# Patient Record
Sex: Female | Born: 1935 | Race: White | Hispanic: No | Marital: Married | Smoking: Former smoker
Health system: Southern US, Community
[De-identification: ages and names within clinical notes are randomized; demographics above are authoritative.]

## PROBLEM LIST (undated history)

## (undated) DIAGNOSIS — R911 Solitary pulmonary nodule: Secondary | ICD-10-CM

## (undated) DIAGNOSIS — I77810 Thoracic aortic ectasia: Secondary | ICD-10-CM

## (undated) DIAGNOSIS — I10 Essential (primary) hypertension: Secondary | ICD-10-CM

## (undated) DIAGNOSIS — Z7901 Long term (current) use of anticoagulants: Secondary | ICD-10-CM

## (undated) DIAGNOSIS — I350 Nonrheumatic aortic (valve) stenosis: Secondary | ICD-10-CM

## (undated) DIAGNOSIS — N179 Acute kidney failure, unspecified: Secondary | ICD-10-CM

## (undated) DIAGNOSIS — J9601 Acute respiratory failure with hypoxia: Secondary | ICD-10-CM

## (undated) DIAGNOSIS — J189 Pneumonia, unspecified organism: Secondary | ICD-10-CM

## (undated) DIAGNOSIS — G459 Transient cerebral ischemic attack, unspecified: Secondary | ICD-10-CM

## (undated) DIAGNOSIS — C801 Malignant (primary) neoplasm, unspecified: Secondary | ICD-10-CM

## (undated) HISTORY — PX: SKIN BIOPSY: SHX1

## (undated) HISTORY — PX: HIP SURGERY: SHX245

## (undated) HISTORY — DX: Nonrheumatic aortic (valve) stenosis: I35.0

## (undated) HISTORY — DX: Long term (current) use of anticoagulants: Z79.01

## (undated) HISTORY — PX: BREAST LUMPECTOMY: SHX2

## (undated) HISTORY — DX: Thoracic aortic ectasia: I77.810

---

## 1997-04-30 ENCOUNTER — Other Ambulatory Visit: Admission: RE | Admit: 1997-04-30 | Discharge: 1997-04-30 | Payer: Self-pay | Admitting: Family Medicine

## 1999-05-03 ENCOUNTER — Other Ambulatory Visit: Admission: RE | Admit: 1999-05-03 | Discharge: 1999-05-03 | Payer: Self-pay | Admitting: Family Medicine

## 1999-05-11 ENCOUNTER — Encounter: Payer: Self-pay | Admitting: Family Medicine

## 1999-05-11 ENCOUNTER — Encounter: Admission: RE | Admit: 1999-05-11 | Discharge: 1999-05-11 | Payer: Self-pay | Admitting: Family Medicine

## 2000-05-11 ENCOUNTER — Encounter: Admission: RE | Admit: 2000-05-11 | Discharge: 2000-05-11 | Payer: Self-pay | Admitting: Family Medicine

## 2000-05-11 ENCOUNTER — Encounter: Payer: Self-pay | Admitting: Family Medicine

## 2000-05-22 ENCOUNTER — Ambulatory Visit (HOSPITAL_BASED_OUTPATIENT_CLINIC_OR_DEPARTMENT_OTHER): Admission: RE | Admit: 2000-05-22 | Discharge: 2000-05-22 | Payer: Self-pay | Admitting: Otolaryngology

## 2000-06-24 ENCOUNTER — Encounter: Payer: Self-pay | Admitting: Emergency Medicine

## 2000-06-24 ENCOUNTER — Encounter: Payer: Self-pay | Admitting: Orthopedic Surgery

## 2000-06-25 ENCOUNTER — Inpatient Hospital Stay (HOSPITAL_COMMUNITY): Admission: EM | Admit: 2000-06-25 | Discharge: 2000-06-25 | Payer: Self-pay | Admitting: Emergency Medicine

## 2001-02-05 ENCOUNTER — Encounter: Payer: Self-pay | Admitting: Family Medicine

## 2001-02-05 ENCOUNTER — Emergency Department (HOSPITAL_COMMUNITY): Admission: EM | Admit: 2001-02-05 | Discharge: 2001-02-06 | Payer: Self-pay | Admitting: Emergency Medicine

## 2001-03-05 ENCOUNTER — Encounter: Payer: Self-pay | Admitting: Specialist

## 2001-03-05 ENCOUNTER — Inpatient Hospital Stay (HOSPITAL_COMMUNITY): Admission: EM | Admit: 2001-03-05 | Discharge: 2001-03-06 | Payer: Self-pay | Admitting: Emergency Medicine

## 2001-04-20 ENCOUNTER — Inpatient Hospital Stay (HOSPITAL_COMMUNITY): Admission: EM | Admit: 2001-04-20 | Discharge: 2001-04-21 | Payer: Self-pay | Admitting: Emergency Medicine

## 2001-04-20 ENCOUNTER — Encounter: Payer: Self-pay | Admitting: Specialist

## 2001-05-10 ENCOUNTER — Encounter: Payer: Self-pay | Admitting: Specialist

## 2001-05-18 ENCOUNTER — Inpatient Hospital Stay (HOSPITAL_COMMUNITY): Admission: RE | Admit: 2001-05-18 | Discharge: 2001-05-22 | Payer: Self-pay | Admitting: Specialist

## 2002-02-20 ENCOUNTER — Other Ambulatory Visit: Admission: RE | Admit: 2002-02-20 | Discharge: 2002-02-20 | Payer: Self-pay | Admitting: Family Medicine

## 2002-02-25 ENCOUNTER — Encounter: Admission: RE | Admit: 2002-02-25 | Discharge: 2002-02-25 | Payer: Self-pay | Admitting: Family Medicine

## 2002-02-25 ENCOUNTER — Encounter: Payer: Self-pay | Admitting: Family Medicine

## 2003-02-21 ENCOUNTER — Other Ambulatory Visit: Admission: RE | Admit: 2003-02-21 | Discharge: 2003-02-21 | Payer: Self-pay | Admitting: Family Medicine

## 2003-03-06 ENCOUNTER — Encounter: Admission: RE | Admit: 2003-03-06 | Discharge: 2003-03-06 | Payer: Self-pay | Admitting: Family Medicine

## 2004-03-22 ENCOUNTER — Other Ambulatory Visit: Admission: RE | Admit: 2004-03-22 | Discharge: 2004-03-22 | Payer: Self-pay | Admitting: Family Medicine

## 2004-06-01 ENCOUNTER — Encounter: Admission: RE | Admit: 2004-06-01 | Discharge: 2004-06-01 | Payer: Self-pay | Admitting: Family Medicine

## 2005-04-26 ENCOUNTER — Other Ambulatory Visit: Admission: RE | Admit: 2005-04-26 | Discharge: 2005-04-26 | Payer: Self-pay | Admitting: Family Medicine

## 2005-07-08 ENCOUNTER — Encounter: Admission: RE | Admit: 2005-07-08 | Discharge: 2005-07-08 | Payer: Self-pay | Admitting: Family Medicine

## 2006-07-21 ENCOUNTER — Encounter: Admission: RE | Admit: 2006-07-21 | Discharge: 2006-07-21 | Payer: Self-pay | Admitting: *Deleted

## 2006-09-12 ENCOUNTER — Ambulatory Visit: Payer: Self-pay | Admitting: Vascular Surgery

## 2007-05-03 ENCOUNTER — Encounter: Admission: RE | Admit: 2007-05-03 | Discharge: 2007-05-03 | Payer: Self-pay | Admitting: Family Medicine

## 2007-07-23 ENCOUNTER — Encounter: Admission: RE | Admit: 2007-07-23 | Discharge: 2007-07-23 | Payer: Self-pay | Admitting: *Deleted

## 2007-11-24 ENCOUNTER — Ambulatory Visit: Payer: Self-pay | Admitting: Internal Medicine

## 2007-11-24 ENCOUNTER — Ambulatory Visit: Payer: Self-pay | Admitting: Cardiology

## 2007-11-24 ENCOUNTER — Inpatient Hospital Stay (HOSPITAL_COMMUNITY): Admission: EM | Admit: 2007-11-24 | Discharge: 2007-11-27 | Payer: Self-pay | Admitting: Emergency Medicine

## 2007-11-26 ENCOUNTER — Encounter (INDEPENDENT_AMBULATORY_CARE_PROVIDER_SITE_OTHER): Payer: Self-pay | Admitting: Cardiology

## 2007-11-27 ENCOUNTER — Encounter (INDEPENDENT_AMBULATORY_CARE_PROVIDER_SITE_OTHER): Payer: Self-pay | Admitting: Internal Medicine

## 2008-05-05 ENCOUNTER — Other Ambulatory Visit: Admission: RE | Admit: 2008-05-05 | Discharge: 2008-05-05 | Payer: Self-pay | Admitting: Family Medicine

## 2009-06-04 ENCOUNTER — Encounter: Admission: RE | Admit: 2009-06-04 | Discharge: 2009-06-04 | Payer: Self-pay | Admitting: *Deleted

## 2010-06-08 NOTE — Consult Note (Signed)
NAMEDEVI, HOPMAN                 ACCOUNT NO.:  [id number]   MEDICAL RECORD NO.:  [id number]          PATIENT TYPE:  INP   LOCATION:  3001                         FACILITY:  [facility]   PHYSICIAN:  Noel Christmas, MD    DATE OF BIRTH:  04-Aug-1926   DATE OF CONSULTATION:  11/25/2007  DATE OF DISCHARGE:                                 CONSULTATION   REASON FOR CONSULTATION:  Acute cerebral infarction.   A 77 year old lady who was in her usual state of health until yesterday  afternoon when she was noted to be flushed and confused by friends and  family members while at an activity at a local church.  The patient had  difficulty with speech output, initially unable to speak at all, then  several minutes later was able to respond but speech output consisted of  numbers only.  After several more minutes she began to form words.  Speech returned to normal within 30 minutes to an hour.  The patient was  also noted to be confused with activities including spilling drink on  herself without being aware of it.  She was able to walk and had no  difficulty with her gait apparently.  She used all of her extremities  equally.  There was no facial asymmetry nor slurring of speech.  The  patient has memory for most of the events that occurred but still does  not recall spilling liquid on herself which apparently occurred twice.  Evaluation on arrival in the emergency room was unremarkable.  Vitals  signs were normal.  No focal abnormalities were noted.  CT scan of her  head showed no acute changes.  MRI of her brain today showed multiple  acute small cerebral infarctions involving the middle cerebral artery  territory on the left (posterior, frontal, parietal, and posterior  insular), all of which were thought to likely be embolic in nature.  A  1.5 cm cystic area was noted in the right temporal region which was  thought to most likely represent a benign epithelial cyst.  The patient  has had no  recurrence of speech abnormality nor confusion since  admission.  She has been taking aspirin 81 mg per day.  There is no  previous history of stroke or TIA.  The MRA of her brain was  unremarkable with no signs of internal carotid nor middle cerebral  artery stenosis occlusion.   PAST MEDICAL HISTORY:  Remarkable for hypertension.  She has a history  as well of hip replacement.   FAMILY HISTORY:  Remarkable for stroke involving 2 siblings.  Family  history is also remarkable for heart disease involving parents.   CURRENT MEDICATIONS:  1. Aspirin 325 mg per day.  2. Simvastatin 20 mg per day.  3. Protonix 40 mg daily.   PHYSICAL EXAMINATION:  GENERAL:  Appearance was that of a slender  elderly lady who was alert and cooperative and in no acute distress.  She is well oriented to time as well as space.  She had no receptive nor  expressive aphasia.  Memory was normal except for  slight amnesia for  events yesterday, and she was symptomatic.  HEENT:  Pupils were equal and reactive to light.  Extraocular movements  were full and conjugate.  Visual fields were intact and normal.  There  was no facial weakness.  Hearing was normal.  Speech and palatal  movement were normal.  MUSCULOSKELETAL:  Strength and muscle tone were normal proximally and  distally in all 4 extremities except for mild right upper extremity  pronator drift.  Deep tendon reflexes were normal and symmetrical  throughout.  Plantar responses were flexor.  Sensory examination was  normal.  NECK:  Carotid auscultation was normal.   CLINICAL IMPRESSION:  1. Multiple acute left cerebral artery infarctions involving middle      cerebral artery territory, most likely embolic phenomena.  2. Benign right temporal cyst.   RECOMMENDATIONS:  1. Carotid Doppler study and echocardiogram as planned.  2. We will discontinue aspirin and add Aggrenox 1 b.i.d.   Thank you for asking me to evaluate Ms. Alessandra Bevels.      Noel Christmas, MD  Electronically Signed     CS/MEDQ  D:  11/25/2007  T:  1August 02, 202009  Job:  [id number]

## 2010-06-08 NOTE — H&P (Signed)
NAMEARDEN, AXON                 ACCOUNT NO.:  [id number]   MEDICAL RECORD NO.:  [id number]          PATIENT TYPE:  INP   LOCATION:  3001                         FACILITY:  [facility]   PHYSICIAN:  Darryl D. Prime, MD    DATE OF BIRTH:  1926/08/16   DATE OF ADMISSION:  11/24/2007  DATE OF DISCHARGE:                              HISTORY & PHYSICAL   The patient notes she sees a physician at [facility] but cannot  recall the name.  She saw Dr. Flonnie Overman prior.  She sees a Lindell Noe,  possibly a P.A.  She is full code.  The patient was the historian to the  rest of this visit and cannot recall the acute event.  At her bedside  was her son and her daughter.  The patient's total visit time is  approximately 58 minutes .   CHIEF COMPLAINT:  Could not talk.  They said I was confused.   HISTORY OF PRESENT ILLNESS:  Ms. Heminger is a 77 year old female with  history of hypertension and at church between 5 p.m. and 6 p.m. while  eating she had an acute onset of apparent confusion.  She was spilling  her drink, talking incoherently, at times when she was talking  coherently she thought it was Wednesday.  She could not recognize her  friend.  She poured drink on herself.  Apparently her face was flushed  red at one point.  The witnesses noted her report there was no loss of  consciousness.  There were no episodes of unresponsiveness.  There was  no focal weakness but her EMS report in triage here, there may have been  slurred speech lasting 5-10 minutes.  The patient's blood pressure on  arrival to triage was 118/98 and she was apparently complaining of a  left posterior headache in triage.  She denies having headache at this  time of the interview or recently.  The patient at the bedside is  complaining of a left leg cramp but denies any fevers, chills, or denies  any sweats.  She denies any recent weight gain or weight loss.  She  denies any change in appetite or recent change in medication.   She  denies any cough.  She denies any dysuria, hematuria.  She denies any  black stools or bloody stools.   PAST MEDICAL/SURGICAL HISTORY:  1. Hypertension.  2. History of multiple hip surgeries.  She is status post hip surgery      on the left.  3. The patient has history of multiple breast biopsies for benign      lesions.  4. She has a history of dizziness associated with hypokalemia.  5. History of small vocal cord polyp.  Status post surgery for that.   ALLERGIES:  No known drug allergies.   MEDICATIONS:  The patient is not sure of this and family will try to  bring her medication list and her bottles.  She notes she may be on  aspirin 81 mg daily, Fosamax every week, calcium, and Vitamin D in the  form of Os-Cal, possibly an  omega-3 fatty acid, and she is possibly on  three antihypertensives, one sounding like Propranolol and then two  others that she cannot recall.   REVIEW OF SYSTEMS:  A 14-point review of systems was negative unless  stated above.   PHYSICAL EXAMINATION:  VITAL SIGNS:  Blood pressure is 158/94 now,  repeat check 134/83, temperature of 98, respiratory rate 18, pulse of  86, and saturations 97% on room air.  GENERAL:  The patient is a female who looks much younger than her stated  age, sitting in bed in no acute distress.  NEUROLOGIC:  She is alert and oriented x4.  Currently strength and  sensation are grossly intact with 5/5 strength throughout.  Sensation is  intact to pinprick and to touch.  She has no cranial nerve  abnormalities.  The patient's deep tendon reflexes are symmetric, 1+.  The Babinskis are not abnormal.  She shows no signs of hyperreflexia.  The patient's finger-to-nose and heel-to-shin are intact.  HEENT:  Normocephalic, atraumatic.  Pupils equal, round, and reactive to  light.  Extraocular movements are intact.  Oropharynx is dry with no  posterior oropharyngeal lesions.  NECK:  Supple with no lymphadenopathy or thyromegaly.  There  are no  carotid bruits.  Brisk upstroke bilaterally of carotids.  No thyromegaly  noted.  LUNGS:  Clear to auscultation bilaterally.  CARDIOVASCULAR:  Regular rhythm and rate with no murmurs, rubs, or  gallops.  No S3 or S4.  ABDOMEN:  Scaphoid, soft, nontender, nondistended.  No  hepatosplenomegaly.  Normal active bowel sounds.  EXTREMITIES:  Show no clubbing, cyanosis, or edema.  NEUROVASCULAR:  2+ radial, 2+ dorsalis pedis, 2+ posterior tibial, 2+  brachial, and again no carotid bruit.   LABORATORY DATA:  White count is 6, hemoglobin is 13.7, hematocrit 41.5,  platelets 167, segs 64%.  Sodium 139 with potassium 3.6, chloride 104,  bicarb 28, BUN 20, creatinine 0.83, glucose was 98, calcium was 9.1.  PT  was 25, INR 1.  Urinalysis is pending.   Chest x-ray pending.  EKG pending.  CT of the head shows no acute  disease.  There were some chronic changes in bilateral white matter.   ASSESSMENT/PLAN:  This is a patient with a history of hypertension, who  now presents with acute confusion and possibly slurred speech worrisome  for a transient ischemic attack.  Rule out orthostatic hypotension.  1. Possible transient ischemic attack with rule out arrhythmia as the      source for possible thromboembolic phenomena.  She will get an EKG      and am placing her on telemetry.  Will rule out hypovolemia by      checking orthostasis and will give her gentle hydration.  Will      check a lipid panel, place her on a statin, and place her on      aspirin Will control  her blood pressure.  Will get an MRI and MRA      of the brain.  Will get carotid Dopplers and echocardiogram to rule      out  possible source of the stroke from the neck or heart. GI      prophylaxis will be with proton pump inhibitor, deep vein      thrombosis prophylaxis with pneumatic compression devices.  Will      get a urinalysis and chest x-ray to rule out infection.      Darryl D. Prime, MD  Electronically  Signed  DDP/MEDQ  D:  11/24/2007  T:  11/24/2007  Job:  [id number]

## 2010-06-08 NOTE — Consult Note (Signed)
VASCULAR SURGERY CONSULTATION   ROOPA, GRAVER  DOB:  1926-07-31                                       09/12/2006  [id number]#:[id number]   Ms. Quirion is a 77 year old female patient who has been experiencing in  her right leg with ambulation. She describes this as a pain which begins  below her knee and goes down to the pretibial area of the right leg,  extending down near her ankle and foot. This usually occurs with walking  although it is somewhat effected by what type of shoes she wears. After  resting, this does improve, and she is able to resume, but she will have  recurrent of the symptoms with further ambulation. She denies any  numbness in the foot and has no rest pain, history of nonhealing ulcers  or other vascular problems. She does occasionally develop cramps in her  legs at night.   PAST MEDICAL HISTORY:  Negative for diabetes, coronary artery disease,  COPD, stroke or hyperlipidemia. She does have hypertension.   PREVIOUS SURGERY:  Includes multiple left hip surgical procedures and  bilateral breast biopsies for benign disease.   FAMILY HISTORY:  Is positive for coronary artery disease with mother,  two sisters dying of myocardial infarction and a brother who has had  coronary artery disease and stroke. Negative for diabetes mellitus.   SOCIAL HISTORY:  The patient is widowed. She has 2 children who have  retired. She has not smoked since 1977. Does not use alcohol.   ALLERGIES:  None known.   PHYSICAL EXAMINATION:  Blood pressure is 150/82, heart rate 60,  respirations 18. Generally, she is a healthy-appearing female in no  acute distress, alert and oriented x3. Her neck is supple with 3+  carotid pulses palpable. No bruits are audible. Neurologic exam is  normal. No palpable adenopathy in the neck. Chest:  Clear to  auscultation. Cardiovascular exam reveals a regular rhythm with no  murmurs. Her abdomen is soft, nontender, with no masses.  Extremity exam  reveals 3+ femoral and popliteal and 2+ posterior tibial pulses palpable  bilaterally. Both feet are well perfused with no evidence of severe  venous insufficiency, hyperpigmentation, ulceration or edema.   Lower extremity arterial Dopplers will be performed in our office which  were essentially normal with ABIs of 1 and no significant drop with  exercise.   She was reassured regarding these findings. I do not feel that any of  her symptoms are due to arterial insufficiency, and she may need an  orthopedic consultation if this continues to be a problem. If I can be  of further assistance, please let me know.   Quita Skye Hart [place], M.D.  Electronically Signed  JDL/MEDQ  D:  09/12/2006  T:  09/14/2006  Job:  [id number]

## 2010-06-11 NOTE — H&P (Signed)
[facility]  Patient:    Julie Mosley, Julie Mosley Visit Number: [id number] MRN: [id number]          Service Type: MED Location: [phone] Attending Physician:  Pierce Crane Dictated by:   Ralene Bathe, P.A. Admit Date:  04/20/2001 Discharge Date: 04/21/2001                           History and Physical  DATE OF BIRTH:  02-18-1926  CHIEF COMPLAINT:  Left hip instability.  HISTORY OF PRESENT ILLNESS:  This is a 77 year old female who had a previous left total hip arthroplasty in 1996.  She initially had done well, however, has had two hip dislocations, most recently in February 2003.  She is felt at this time to require revision of the cup as indicated and due to intraoperative findings.  At this time she is in agreement and wishes to proceed.  PAST MEDICAL HISTORY: 1. Hypertension. 2. Osteoarthritis. 3. Chronic stress fracture right foot. 4. Recent workup for dizzy spells, which was found to be due to hypokalemia.    Negative Dopplers at that time.  PAST SURGICAL HISTORY: 1. Left total hip arthroplasty in 1996, two closed reductions. 2. Removal of vocal cord polyp. 3. Removal of benign breast cyst.  MEDICATIONS: 1. Toprol XL 25 mg q.d. 2. Klor-Con 20 mg q.d.  ALLERGIES:  No known drug allergies.  SOCIAL HISTORY:  She lives in a one-floor home, five steps into the usual entrance.  Does not smoke or drink.  She does have her husband to help her postoperatively.  FAMILY HISTORY:  Significant for mother with heart disease and father with liver cancer.  One sister and one brother have had strokes; sister at age 62 and brother at age 6.  REVIEW OF SYSTEMS:  The patient denies any recent fevers, chills, night sweats.  CNS:  No blurred or double vision.  No headaches, seizures, paralysis.  RESPIRATORY:  No shortness of breath, productive cough, hemoptysis.   CARDIOVASCULAR:  No chest pain, angina, orthopnea. GASTROINTESTINAL:  No  nausea, vomiting, constipation, melena, bloody stools. GENITOURINARY:  No dysuria, hematuria.  MUSCULOSKELETAL:  As pertinent to present illness.  PHYSICAL EXAMINATION:  GENERAL:  The patient is well-developed, well-nourished 77 year old female. She is currently in a Cam walker on the right foot, and she is neurovascularly intact with her pelvic brace on, abduction brace on her left extremity.  VITAL SIGNS:  Blood pressure 150/92.  HEENT:  Normocephalic.  Extraocular motions intact.  NECK:  Supple.  No lymphadenopathy.  No carotid bruits appreciated on exam.  CHEST:  Clear to auscultation bilaterally.  No rales, no rhonchi.  HEART:  Regular rate and rhythm.  No murmurs, gallops, rubs, heaves, or thrills.  ABDOMEN:  Positive bowel sounds.  Soft and nontender.  EXTREMITIES:  Neurovascularly intact to both lower extremities.  IMPRESSION: 1. Left hip instability, status post left total hip arthroplasty with liner    wear. 2. Hypertension. 3. Osteoarthritis. 4. Stress fracture right foot currently.  PLAN:  Admission for revision of her cup and liner as indicated.  Medical physician is Dr. Margrett Rud.  She will require his assistance intraoperatively. Dictated by:   Ralene Bathe, P.A. Attending Physician:  Pierce Crane DD:  05/15/01 TD:  05/15/01 Job: [id number]

## 2010-06-11 NOTE — Op Note (Signed)
[facility]  Patient:    Julie Mosley, Julie Mosley Visit Number: [id number] MRN: [id number]          Service Type: MED Location: [phone] Attending Physician:  Pierce Crane Dictated by:   Javier Docker, M.D. Proc. Date: 04/20/01 Admit Date:  04/20/2001                             Operative Report  PREOPERATIVE DIAGNOSIS:  Dislocated left total hip replacement.  POSTOPERATIVE DIAGNOSIS:  Dislocated left total hip replacement.  PROCEDURE:  Closed reduction left total hip.  BRIEF HISTORY:  A 77 year old status post dislocation of a total hip a month ago reduced by Dr. Lequita Halt, a patient of Dr. Montez Morita. Sent home today and scooted across the sofa, had dislocation. Seen through the emergency room, x-rays demonstrated dislocated hip superiorly and posteriorly. Operative intervention was indicated for closed reduction.  TECHNIQUE:  The patient in the supine position and after induction of adequate regional spinal anesthesia, a 90 degree flexion maneuver with gentle traction was applied to the left lower extremity. Reduction was appreciated and was located by C-arm augmentation. However, it was unstable past 90 degrees of flexion and was re-reduced and prevented from flexing past 90 degrees by placement of an abduction pillow. Post reduction radiographs by C-arm were satisfactory.  The patient will be admitted overnight for observation. Husband to bring to the hospital a brace he had utilized at home. Will be discharged to follow-up with Dr. Montez Morita. Dictated by:   Javier Docker, M.D. Attending Physician:  Pierce Crane DD:  04/20/01 TD:  04/21/01 Job: [phone]

## 2010-06-11 NOTE — Discharge Summary (Signed)
[place]  Patient:    Julie Mosley, MALLERY Visit Number: [id number] MRN: [id number]          Service Type: EMS Location: [place] Attending Physician:  Lewayne Bunting Dictated by:   Chinita Pester, N.P. Admit Date:  02/05/2001 Discharge Date: 02/05/2001   CC:         Dr. Andrey Campanile   Discharge Summary  HISTORY OF PRESENT ILLNESS:  This is a 77 year old female who was admitted to [facility] Emergency Room on February 05, 2001, and discharged on February 06, 2001.  She was admitted with a complaint of weakness, presyncope, and vertigo.  She is a 77 year old female with no cardiac history.  Starting on January 31, 2001, the patient started experiencing episodes of vertigo, had "swimmy head" while supine.  Several times while starting to stand, she felt weak in the legs.  No vision changes or frank syncope.  The patient has had palpitations with these episodes.  The day of admission she had an episode of weakness on getting out of bed. She went to see Dr. Andrey Campanile, who sent her to the emergency room.  PAST MEDICAL HISTORY:  Positive for hypertension, removal of throat polyps, left hip replacement and left hip dislocation.  ALLERGIES:  No known drug allergies.  MEDICATIONS:  Imdur 5 mcg.  HOSPITAL COURSE:  During evaluation it was noted the patients potassium level was 2.8 and she was hypovolemic. Fluids were replaced.  Her potassium was replaced.  The patient had improvement of her symptoms after IV fluids and repletion of her potassium.  DISPOSITION:  She was discharged home on all of her previous medications and also to increase her fluid intake as well as her salt intake and follow with Dr. Andrey Campanile and Doylene Canning. Ladona Ridgel, M.D., as needed. Dictated by:   Chinita Pester, N.P. Attending Physician:  Lewayne Bunting DD:  02/06/01 TD:  02/07/01 Job: [id number]

## 2010-06-11 NOTE — Op Note (Signed)
[facility]  Patient:    Julie Mosley, Julie Mosley Visit Number: [id number] MRN: [id number]          Service Type: MED Location: [phone] Attending Physician:  Rocky Crafts Dictated by:   Michael Litter. Montez Morita, M.D. Proc. Date: 03/05/01 Admit Date:  03/05/2001                             Operative Report  PREOPERATIVE DIAGNOSIS:  Left hip dislocation.  POSTOPERATIVE DIAGNOSIS:  Left hip dislocation.  PROCEDURE:  Closed reduction.  SURGEON:  Philips J. Montez Morita, M.D.  DESCRIPTION OF PROCEDURE:  After suitable spinal anesthesia, she reduced using standing overhead with straight traction and a little rotation.  After the reduction, she was put through a full range of motion, and the hip was felt to be very stable.  Her clinical history was that she has been having some inner ear dizzy spells. She bent forward in the kitchen and got dizzy, and then she fell backwards, and fell over a waste basket, and the hip dislocated, and I think it was a traumatic dislocation.   The hip was put in in 1996.  She had one previous dislocation six months ago.  In looking at what happened to her then, it appeared also to have been a traumatic-type dislocation.  She was extremely stable after that reduction by Dr. Lequita Halt.  I do not feel that she needs a brace or revision.  She was recently evaluated at [facility] for these dizzy spells, having had her heart and her carotid arteries checked out, and current thought is that it is an inner ear problem, and referred her back to her medical doctor for that. She goes to recovery in good condition.  The x-rays are satisfactory post reduction. Dictated by:   [employer]. Montez Morita, M.D. Attending Physician:  Rocky Crafts DD:  03/05/01 TD:  03/06/01 Job: [id number]

## 2010-06-11 NOTE — Discharge Summary (Signed)
[facility]  Patient:    Julie Mosley, Julie Mosley Visit Number: [id number] MRN: [id number]          Service Type: SUR Location: 4W [id number] [id number] Attending Physician:  Rocky Crafts Dictated by:   Della Goo, P.A.-C. Admit Date:  05/18/2001 Discharge Date: 05/22/2001                             Discharge Summary  ADMISSION DIAGNOSES: 1. 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 2. Hypertension. 3. Osteoarthritis. 4. History of hypokalemia.  DISCHARGE DIAGNOSES: 1. 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 2. Hypertension. 3. Osteoarthritis. 4. History of hypokalemia. 5. Postoperative esophageal food impaction, resolved at discharge. 6. Post hemorrhagic anemia.  PROCEDURES:  On May 18, 2001, the patient underwent revision of acetabular component and femoral head of left total hip replacement performed by Dr Montez Morita, assisted by Dr. Darrelyn Hillock, under general anesthesia.  CONSULTATIONS:  Dr. Lucky Cowboy, otolaryngology.  BRIEF HISTORY:  Julie Mosley is a 77 year old female with previous left total hip replacement in 1996.  She has had two dislocations with the most recent one being February 2003.  It was felt that she would require revision of he cup and was admitted to the hospital to undergo this procedure.  HOSPITAL COURSE:  The patient tolerated the procedure without difficulty. Postoperatively, neurovascular and motor functions were found to be intact in both lower extremities.  The patient was full weightbearing on the operative extremity.  She participated with physical therapy during her hospital stay and was able to ambulate as much as 100 feet prior to discharge.  She was safe with transfers and understood total hip replacement precautions and was able to demonstrate these as well.  The patient was placed on Coumadin for DVT prophylaxis  and PE prophylaxis. She was monitored by the pharmacy at [facility] with adjustments in dosage made according to daily pro time.  The patients incision was healing well at the left hip.  She was noted to have some blisters of the proximal posterior thigh and buttock area.  These were covered with Tegaderm.  There was some mild serous drainage from this area; however, no signs of infection. Neurovascular and motor functions remained intact throughout the hospital stay.  On May 22, 2001, her fourth postoperative day, she was felt stable for discharge to her home.  LABORATORY DATA:  Admission CBC: Hemoglobin 11.9, hematocrit 35.4.  Hemoglobin dropped to the lowest value of 10.9 and prior to discharge was 11.3. Admission coagulation studies were normal.  At the time of discharge, her INR was 1.3 and pro time 15.8.  Chemistries on admission were within normal limits with the exception of glucose of 120.  Repeat chemistry study on April 26 showed values normal with the exception of glucose 199.  Urinalysis on and showed moderate leukocyte esterase, 3 to 6 wbcs, and many bacteria.  A repeat on April 25 showed trace leukocyte esterase, few epithelial cells, 3 to 6 wbcs, and hyaline cast.  Chest x-ray on admission showed no active disease.  EKG on admission showed normal sinus rhythm, nonspecific ST and T wave abnormalities with premature ventricular complexes not seen since previous tracing, confirmed by Dr. Viann Fish.  PLAN:  The patient is being discharged to her home.  She will utilize [employer] for physical  therapy as well as Coumadin management.  She will continue to be weightbearing as tolerated for ambulation and will undergo range of motion strengthening exercises adhering to total hip replacement precautions.  Equipment has been provided for home including a walker and elevated toilet seat.  She will change her dressing daily to the wound.   The blisters on the posterior proximal thigh will be treated with antibiotic ointment at home.  She will be allowed to shower.  She has been advised to call the office if she notes any drainage from her wound or any changes in the blisters of her skin which were caused by tape.  She will follow up with Dr. Montez Morita two weeks from the day of her surgery and has been advised to call the office to arrange the appointment.  DISCHARGE MEDICATIONS:  She will resume her home medications and has been given prescriptions. 1. Percocet 5/325, #30, 1 to 2 every 4 to 6 hours for pain. 2. Skelaxin 400 mg, #30, 1 every 6 to 8 hours as needed for spasm. 3. Coumadin will be dosed per the pharmacy.  SPECIAL INSTRUCTIONS:  She has been advised to call Dr. Assunta Found office if there are further questions or concerns prior to her return office visit.  She will follow up with her family medical doctor regarding any other medical issues.  CONDITION UPON DISCHARGE:  Stable. Dictated by:   Della Goo, P.A.-C. Attending Physician:  Rocky Crafts DD:  05/22/01 TD:  05/22/01 Job: [id number]

## 2010-06-11 NOTE — H&P (Signed)
[facility]  Patient:    Julie Mosley, Julie Mosley Visit Number: [id number] MRN: [id number]          Service Type: MED Location: [phone] Attending Physician:  Rocky Crafts Dictated by:   Michael Litter. Montez Morita, M.D. Admit Date:  03/05/2001                           History and Physical  CHIEF COMPLAINT:  Dislocated left hip.  HISTORY OF PRESENT ILLNESS:  This is a pleasant 77 year old lady who had bent over in the kitchen, got dizzy, and fell backwards over a trash can, and dislocated her left hip.  PAST MEDICAL HISTORY:  Left total hip in 1996.  Did well and then had a similar traumatic episode about six months ago, requiring a location under general anesthesia, being done by Dr. Lequita Halt in June of 2002.  She has done well every since until this traumatic incident.  Just recently, she was hospitalized with fall out spells.  They worked up her heart and she had a little low potassium, and was now taking a potassium supplement and her blood pressure medicine which has now been changed to Toprol.  Had carotid Dopplers as well.  Was told it is an inner ear problem and is being followed for that.  REVIEW OF SYSTEMS:  Otherwise, her review of systems is fairly benign.  PHYSICAL EXAMINATION:  HEART:  Revealed a normal sinus rhythm without murmur.  ABDOMEN:  Soft.  LUNGS:  Clear.  EXTREMITIES:  Neurovascular to the left foot seemed okay.  The left hip was shortened and internally rotated.  IMPRESSION:  Acute traumatic dislocation of left total hip prosthesis.  RECOMMENDATION:  The plan was to take her to the operating room and do a closed reduction with spinal anesthesia. Dictated by:   [employer]. Montez Morita, M.D. Attending Physician:  Rocky Crafts DD:  03/05/01 TD:  03/06/01 Job: [id number]

## 2010-06-11 NOTE — Op Note (Signed)
[place]  Patient:    Julie Mosley, Julie Mosley                        MRN: [id number] Proc. Date: 05/22/00 Adm. Date:  [id number] Attending:  Susy Frizzle CC:         Vale Haven. Andrey Campanile, M.D.   Operative Report  PREOPERATIVE DIAGNOSIS:  Hoarseness and laryngeal mass.  POSTOPERATIVE DIAGNOSIS:  Hoarseness and laryngeal mass.  PROCEDURE:  Microlaryngoscopy with excision of vocal cord mass.  SURGEON:  Jefry H. Pollyann Kennedy, M.D.  ANESTHESIA:  General endotracheal.  COMPLICATIONS:  None.  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  The remainder of the examination unremarkable.  REFERRING PHYSICIAN:  Duffy Rhody C. Andrey Campanile, M.D.  HISTORY:  A 77 year old lady with a three to four month history of chronic hoarseness.  Risks, benefits, alternatives, and complications of the procedure were explained to the patient who seemed to understand and agreed to surgery.  DESCRIPTION OF PROCEDURE:  The patient was taken to the operating room and placed on the operating table in the supine position.  Following induction of general endotracheal anesthesia, the table was turned, and the patient was prepped and draped in the standard fashion for laryngoscopy.  The Dedo laryngoscope was inserted into the oral cavity and used to view the laryngeal and hyopharyngeal structures.  It was attached to the suspension apparatus to the Mayo stand.  The microscope was brought into place.  The examination revealed the above mentioned findings.  Careful inspection of the remainder of the larynx was unremarkable.  The vocal cords themselves were clean other than the pedunculated mass.  The mass itself was irregularly shaped, slightly purplish to erythematous, and appeared to be covered by mucosa. Microlaryngoscopy scissors were used to excise the base of the lesion.  There was minimal bleeding.  The remaining mucosa was basically reopposed with a small defect on the  membranous surface of the left mid true vocal cord. Topical adrenalin was used to provide hemostasis.  The airway was suctioned of blood and secretions.  The laryngoscope was removed.  The lesion was sent for pathologic evaluation.  The patient was then awakened, extubated, and transferred to recovery. DD:  05/23/00 TD:  05/23/00 Job: [id number]

## 2010-06-11 NOTE — Discharge Summary (Signed)
NAMESUKAINA, Julie Mosley                 ACCOUNT NO.:  [id number]   MEDICAL RECORD NO.:  [id number]          PATIENT TYPE:  INP   LOCATION:  3001                         FACILITY:  [facility]   PHYSICIAN:  Kela Millin, M.D.DATE OF BIRTH:  05/14/1926   DATE OF ADMISSION:  11/23/2007  DATE OF DISCHARGE:  11/27/2007                               DISCHARGE SUMMARY   DISCHARGE DIAGNOSES:  1. Left middle cerebral artery territory infarcts, likely embolic, TEE      negative for embolic source.  2. Probable urinary tract infection.  3. Hypokalemia.  Potassium replaced.  4. Hypertension.  5. 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  6. History of multiple breast biopsies for benign lesions.  7. History of multiple hip surgeries.   PROCEDURE AND STUDIES:  1. CT scan of head.  Chronic ischemic changes.  No acute abnormality.  2. MRA of the brain.  Several small areas of acute infarct in the left      middle cerebral artery territory likely due to emboli.  Atrophy and      chronic microvascular ischemia.  Benign appearing cyst in the right      temporal lobe.  MRA of head was negative.  3. A 2-D echocardiogram .  Overall left ventricular systolic function      was mildly decreased the left ventricular ejection fraction      estimated range of 40-45%.  There was moderate focal basal septal      hypertrophy.  There was moderate paradoxical motion of the      interventricular septum consistent with a conduction abnormality or      paced rhythm.  The aortic valve was mildly calcified.  Findings      were consistent with very mild aortic valve stenosis.  There was a      small mobile vegetation of the aortic valve.  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  There was moderate aortic      root dilatation.  There was a moderate mitral valve prolapse      involving the posterior leaflet.  There was  mild mitral valvular      regurgitation.  The mitral regurgitation check was eccentric and      directed anteriorly.  4. Transesophageal echocardiogram.  Left ventricular ejection fraction      estimated to be 55%.  Aortic valve thickness mildly increased.      Findings consistent with mild aortic valve stenosis.  There was      mild ascending aortic dilatation.  There was a moderate atheroma of      the descending aorta.  The left atrium was moderately dilated.  No      intracardiac shunt was detected by contrast study with agitated      saline.  No vegetations reported.   CONSULTATIONS:  1. Neurology, Dr. Pearlean Brownie.  2. Cardiology, Dr. Gala Romney.   BRIEF HISTORY:  The patient is a 77 year old white  female with the above-  listed medical problems who presented with confusion and difficulty  talking.  It was reported that she had gone to church, and while eating,  she had an acute onset of confusion.  She spilled her drink and was  talking incoherently for the most part.  There was no focal weakness  reported, and there may have been slurred speech lasting about 5-10  minutes.  She came to the ER and a CT scan of the head was done and the  results as stated above.  She was admitted for further evaluation and  management.   Please see the full admission history and physical dictated on November 24, 2007, by Dr. Rachael Fee Prime for the details of the admission physical  exam as well as the laboratory data.   HOSPITAL COURSE:  1. Left middle cerebral artery territory infarct.  Upon admission, she      had a CT scan of her brain done and the results as stated above,      and it was noted that she had been on 81 mg dose aspirin daily      prior to admission, and her dose was at that time increased to 325      mg daily.  She had an MRI of her brain and the results as stated      above revealing the left MCA territory infarct.  Following this,      the aspirin was discontinued and the patient  was placed on Aggrenox      b.i.d.  Neurology saw the patient and agreed with Aggrenox.  They      recommended that a TEE be done and [facility] cardiology was consulted      for the TEE.  It was done on November 27, 2007, and the results as      stated above - negative for embolic source.  The patient had a      swallowing evaluation done and a regular diet with thin liquids was      recommended.  The patient did not have any focal weakness and was      ambulating without difficulty.  After she was started on the      Aggrenox b.i.d., she developed headaches, nausea and vomiting and      so neurology recommended decreasing the dose to one tablet a day      for 2 weeks and then to go up to one tablet two times a day      thereafter and she was to take Tylenol 30 minutes before the      Aggrenox.  After the TEE came back negative for a mild source,      Cardiology and Neurology both recommended that she be discharged      home.  2. Urinary tract infection.  The patient had a urinalysis done while      in the hospital and it was consistent with a UTI.  She was started      on Rocephin.  She has remained afebrile with no leukocytosis.  She      was discharged on Ceftin to complete antibiotic course outpatient.  3. Hypertension.  The patient was to continue her outpatient      medications upon discharge.   DISCHARGE MEDICATIONS:  1. Metoprolol 25 mg p.o. b.i.d.  2. Alendronate 70 mg once a week.  3. Lisinopril 10 mg daily.  4. HCTZ 25 mg daily.  5. Omega-3 fatty  acids 1000 mg daily.  6. Multivitamin one p.o. daily.  7. Caltrate with vitamin D 2 tablets daily.  8. Glucosamine/chondroitin one tablet daily.  9. Aggrenox one tablet p.o. daily for 2 weeks and then changed to one      tablet b.i.d.  Take Tylenol 650 mg 30 minutes before Aggrenox.  10.Ceftin 250 mg one p.o. b.i.d..  11.Patient instructed to stop aspirin.   FOLLOW-UP CARE:  1. PCP/Lynn Abramowitz in 1-2 weeks.  2. Dr. Pearlean Brownie.   The patient to call Dr. Marlis Edelson office at [phone] to      arrange outpatient transcranial Doppler emboli monitoring of bubble      study.   DISCHARGE CONDITION:  Improved, stable.      Kela Millin, M.D.  Electronically Signed     ACV/MEDQ  D:  01/03/2008  T:  01/03/2008  Job:  [id number]   cc:   [facility]  Pramod P. Pearlean Brownie, MD

## 2010-06-11 NOTE — H&P (Signed)
[place]  Patient:    Julie Mosley, Julie Mosley Visit Number: [id number] MRN: [id number]          Service Type: MED Location: [phone] Attending Physician:  Devoria Albe Dictated by:   Doylene Canning. Ladona Ridgel, M.D. [facility] Admit Date:  02/05/2001   CC:         Duffy Rhody C. Andrey Campanile, M.D.   History and Physical  CHIEF COMPLAINT: Weakness and presyncope.  HISTORY OF PRESENT ILLNESS: The patient is a very pleasant, 77 year old woman, who is followed by Dr. Karma Ganja, who had no cardiac history. Over the last week, she noticed increasing weak spells and dizzy spells associated with palpitations occurring when she stands up too quickly. These spells have occurred as well at other times.  There is no frank syncope. There is no particular warning, although the symptoms seem to be made worse by quick standing. She denies any chest pain or shortness of breath. Her specific complaint is that of weakness.  PAST MEDICAL HISTORY: Notable for hypertension.  She has a history of throat polyps that were removed. She has a history of left hip replacement with a dislocation in the past.  SOCIAL HISTORY: The patient lives in [place] and is married. She has a history of tobacco but none recently and she denies any ethanol abuse.  FAMILY HISTORY: Notable for a father with an MI at age 17, and a mother who died of cancer. She has several siblings with coronary disease.  REVIEW OF SYSTEMS: Notable for no fevers, chills, night sweats or adenopathy. She denies any headache, hearing or visual changes. She does have episodes of near-syncope or presyncope as previously described. These both typically last only for a few seconds. She denies any skin changes. There are no rashes.  She denies any chest pain, shortness of breath, or dyspnea. She does have some palpitations. She denies claudication or cough. She denies any urinary frequency, urgency or dysuria. She denies any weakness,  numbness, or depression. She denies arthritic symptoms, although she does have some joint deformity in her hands bilaterally. These are not tender or painful. She denies any nausea, vomiting, diarrhea, or constipation. She denies melena. She denies any recent skin changes, weight loss, weight gain, or heat or cold intolerance.  PHYSICAL EXAMINATION:  GENERAL: On physical examination, she is a pleasant, well appearing elderly woman in no distress.  VITAL SIGNS: Blood pressure is 125/85. The pulse was 78 and regular. The respirations were 20. Temperature was 98.  HEENT: Normocephalic, atraumatic. Pupils equal and round. The oropharynx moist.  ABDOMEN: Soft, nontender, nondistended and there was no rebound or guarding.  NECK: Revealed no jugular venous distention. There were no bruits.  The trachea was midline and there is no adenopathy.  CARDIOVASCULAR: Examination revealed a regular rate and rhythm with normal S1 and S2. I did not appreciate any murmurs.  EXTREMITIES: Demonstrated no obvious clubbing, cyanosis, or edema.  LUNGS: Clear bilaterally. Auscultation with rare basilar rales.  SKIN: Revealed no rashes.  ECG demonstrates normal sinus rhythm with frequent PVCs.  IMPRESSION: 1. Recurrent episodes of presyncope associated with postural change, most    likely secondary to orthostasis. 2. Rare palpitations, most likely secondary to premature ventricular    contractions. 3. Hypertension.  DISCUSSION: We plan on admitting the patient to the hospital. Will check cardiac enzymes. Will repeat her ECG. We will watch her on telemetry. Will obtain a 2-D echocardiogram. Will do orthostatic vitals. Additional evaluation pending the above results.  Dictated by:   Doylene Canning. Ladona Ridgel, M.D. [facility] Attending Physician:  Devoria Albe DD:  02/05/01 TD:  02/06/01 Job: [id number]

## 2010-06-11 NOTE — Op Note (Signed)
[facility]  Patient:    ARLIENE, ROSENOW                        MRN: [id number] Proc. Date: 06/24/00 Adm. Date:  [id number] Disc. Date: [id number] Attending:  Ollen Gross V                           Operative Report  PREOPERATIVE DIAGNOSIS:  Left total hip arthroplasty dislocation.  POSTOPERATIVE DIAGNOSIS:  Left total hip arthroplasty dislocation.  OPERATION:  Closed reduction left hip.  SURGEON:  Ollen Gross, M.D.  ANESTHESIA:  General.  COMPLICATIONS:  None.  CONDITION:  To recovery stable.  INDICATIONS:  Ms. Reader is a 77 year old female who had a fall at home earlier today slipping on a wet floor and sustained left total hip dislocation.  She comes in now and is taken to the operating room for a closed reduction.  She had no neurovascular compromise with the dislocation.  DESCRIPTION OF PROCEDURE:  After the successful administration of general endotracheal anesthetic, traction was placed on the left lower extremity and hip audibly and palpably reduced.  She was then placed in range of motion and was stable to 90 degrees of flexion, 40 degrees of internal rotation, 30 degrees at external rotation, and also stable at full extension, external rotation.  She was placed into a knee immobilizer.  She was subsequently awakened and transported to recovery in stable condition. DD:  06/24/00 TD:  06/25/00 Job: [id number]

## 2010-06-11 NOTE — Op Note (Signed)
[facility]  Patient:    Julie Mosley, Julie Mosley Visit Number: [id number] MRN: [id number]          Service Type: SUR Location: 4W 0465 01 Attending Physician:  Rocky Crafts Dictated by:   Michael Litter. Montez Morita, M.D. Proc. Date: 05/18/01 Admit Date:  05/18/2001                             Operative Report  SURGEON:  Philips J. Montez Morita, M.D.  ASSISTANT:  Georges Lynch. Darrelyn Hillock, M.D.  PREOPERATIVE DIAGNOSIS:  Recurrent dislocation, left total hip.  POSTOPERATIVE DIAGNOSIS:  Recurrent dislocation, left total hip.  OPERATIVE PROCEDURE:  Revision of the acetabular component and the femoral head.  DESCRIPTION OF PROCEDURE:  After suitable general anesthesia, she is positioned in the right lateral decubitus and the left hip prepped and draped routinely.  An incision is made through the old incision.  The gluteus medius is protected, and the external rotator capsular mass is reflected from bone. The sciatic nerve is protected with finger. and a little bit of the rotator shell and the outside capsule is saved but the inside is removed which is markedly thick, angry material with some metal allergic-type reaction and lots of thick, heavy granulation.  After removing a good bit of the posterior aspect, it was realized how easily she was dislocating with abduction and gentle rotation.  There seemed to be a massive amount of scar tissue built up over the anteromedial area that was tending to kick it out.  The cup was then removed by means by drill hole and a screw, and then all of that anterior synovial thick scar was all removed, and the edge of the metal cup was exposed all the way around.  A trial reduction was then done with a 32 degree head with a 10 degree cup liner, moving it a bit posteriorly from the last position, and it was extremely stable then with the 32 mm head plus 10 going from a 0 on the 26 to a plus 10 with 32.  External rotation was extremely stable  as well with full hyperextension and external rotation really stable. So we elected to go with that rather than the constrained cup based on her original history of spell that first dislocated it.  A real cup was then inserted followed by the head.  A thorough irrigation was done to get the loose pieces of plastic that had come out with the drill out, clean all the material up, good dry field was obtained.  Reattached a little bit of the remaining capsular external rotator mass back in position.  Sciatic nerve was well out of the way.  Routine wound closure then of the fascia lata and gluteus maximus with #1 PDS, a little 2-0 in the subcu, and a running Monocryl with Steri-Strips in the skin.  Nice compression dressing.  She goes to recovery in good condition. Dictated by:   [employer]. Montez Morita, M.D. Attending Physician:  Rocky Crafts DD:  05/18/01 TD:  05/19/01 Job: [id number]

## 2010-06-15 ENCOUNTER — Other Ambulatory Visit: Payer: Self-pay | Admitting: Family Medicine

## 2010-06-15 DIAGNOSIS — Z1231 Encounter for screening mammogram for malignant neoplasm of breast: Secondary | ICD-10-CM

## 2010-06-25 ENCOUNTER — Ambulatory Visit
Admission: RE | Admit: 2010-06-25 | Discharge: 2010-06-25 | Disposition: A | Discharge status: Home / Self Care | Payer: Medicare Other | Source: Ambulatory Visit | Attending: Family Medicine | Admitting: Family Medicine

## 2010-06-25 DIAGNOSIS — Z1231 Encounter for screening mammogram for malignant neoplasm of breast: Secondary | ICD-10-CM

## 2010-10-25 LAB — CK TOTAL AND CKMB (NOT AT ARMC)
CK, MB: 2.8
Relative Index: INVALID
Total CK: 98

## 2010-10-25 LAB — PROTIME-INR
INR: 1
Prothrombin Time: 13.3

## 2010-10-25 LAB — DIFFERENTIAL
Basophils Absolute: 0
Basophils Relative: 1
Eosinophils Relative: 5
Lymphocytes Relative: 20
Lymphs Abs: 1.2
Monocytes Absolute: 0.6
Monocytes Relative: 10
Neutro Abs: 3.8

## 2010-10-25 LAB — URINE MICROSCOPIC-ADD ON

## 2010-10-25 LAB — URINALYSIS, ROUTINE W REFLEX MICROSCOPIC
Ketones, ur: NEGATIVE
Specific Gravity, Urine: 1.016
Urobilinogen, UA: 0.2

## 2010-10-25 LAB — CBC
HCT: 41.5
MCHC: 33.1
WBC: 6

## 2010-10-25 LAB — APTT: aPTT: 25

## 2010-10-25 LAB — TROPONIN I: Troponin I: 0.05

## 2010-10-25 LAB — BASIC METABOLIC PANEL
Calcium: 9.1
Potassium: 3.6

## 2010-10-26 LAB — URINE CULTURE: Colony Count: 70000

## 2010-10-26 LAB — BASIC METABOLIC PANEL
CO2: 24
Calcium: 8.4
Calcium: 8.4
Chloride: 109
Creatinine, Ser: 0.52
GFR calc Af Amer: >60
GFR calc Af Amer: >60
GFR calc non Af Amer: >60
Glucose, Bld: 92
Potassium: 3.5
Potassium: 3.9
Sodium: 137

## 2010-10-26 LAB — OCCULT BLOOD X 1 CARD TO LAB, STOOL: Fecal Occult Bld: NEGATIVE

## 2010-10-26 LAB — DIFFERENTIAL
Basophils Absolute: 0
Basophils Relative: 0
Eosinophils Absolute: 0.3
Eosinophils Relative: 4
Lymphs Abs: 1.4
Monocytes Absolute: 0.7
Monocytes Relative: 11

## 2010-10-26 LAB — HEMOGLOBIN A1C
Hgb A1c MFr Bld: 5.6
Mean Plasma Glucose: 114

## 2010-10-26 LAB — CBC
MCHC: 33.8
Platelets: 139 — ABNORMAL LOW
Platelets: DECREASED
RBC: 3.9
RBC: 4.13
WBC: 6.7

## 2010-10-26 LAB — MAGNESIUM: Magnesium: 1.8

## 2010-10-26 LAB — LIPID PANEL
Cholesterol: 116
LDL Cholesterol: 61

## 2012-03-20 ENCOUNTER — Ambulatory Visit
Admission: RE | Admit: 2012-03-20 | Discharge: 2012-03-20 | Disposition: A | Discharge status: Home / Self Care | Payer: Medicare Other | Source: Ambulatory Visit | Attending: Family Medicine | Admitting: Family Medicine

## 2012-03-20 ENCOUNTER — Other Ambulatory Visit: Payer: Self-pay | Admitting: Family Medicine

## 2012-03-20 DIAGNOSIS — M549 Dorsalgia, unspecified: Secondary | ICD-10-CM

## 2012-03-20 DIAGNOSIS — M542 Cervicalgia: Secondary | ICD-10-CM

## 2012-04-02 ENCOUNTER — Emergency Department (HOSPITAL_COMMUNITY): Payer: Medicare Other

## 2012-04-02 ENCOUNTER — Encounter (HOSPITAL_COMMUNITY): Payer: Self-pay | Admitting: *Deleted

## 2012-04-02 ENCOUNTER — Observation Stay (HOSPITAL_COMMUNITY)
Admission: EM | Admit: 2012-04-02 | Discharge: 2012-04-04 | Disposition: A | Discharge status: Skilled Nursing Facility | Payer: Medicare Other | Attending: Internal Medicine | Admitting: Internal Medicine

## 2012-04-02 ENCOUNTER — Inpatient Hospital Stay (HOSPITAL_COMMUNITY): Payer: Medicare Other

## 2012-04-02 DIAGNOSIS — R52 Pain, unspecified: Secondary | ICD-10-CM

## 2012-04-02 DIAGNOSIS — R42 Dizziness and giddiness: Secondary | ICD-10-CM

## 2012-04-02 DIAGNOSIS — C50919 Malignant neoplasm of unspecified site of unspecified female breast: Secondary | ICD-10-CM | POA: Insufficient documentation

## 2012-04-02 DIAGNOSIS — Z8673 Personal history of transient ischemic attack (TIA), and cerebral infarction without residual deficits: Secondary | ICD-10-CM

## 2012-04-02 DIAGNOSIS — I4891 Unspecified atrial fibrillation: Secondary | ICD-10-CM | POA: Insufficient documentation

## 2012-04-02 DIAGNOSIS — G319 Degenerative disease of nervous system, unspecified: Secondary | ICD-10-CM | POA: Insufficient documentation

## 2012-04-02 DIAGNOSIS — M4000 Postural kyphosis, site unspecified: Secondary | ICD-10-CM | POA: Insufficient documentation

## 2012-04-02 DIAGNOSIS — R0681 Apnea, not elsewhere classified: Secondary | ICD-10-CM | POA: Insufficient documentation

## 2012-04-02 DIAGNOSIS — Z7901 Long term (current) use of anticoagulants: Secondary | ICD-10-CM | POA: Insufficient documentation

## 2012-04-02 DIAGNOSIS — G459 Transient cerebral ischemic attack, unspecified: Secondary | ICD-10-CM | POA: Insufficient documentation

## 2012-04-02 DIAGNOSIS — M1990 Unspecified osteoarthritis, unspecified site: Secondary | ICD-10-CM | POA: Insufficient documentation

## 2012-04-02 DIAGNOSIS — R110 Nausea: Secondary | ICD-10-CM

## 2012-04-02 DIAGNOSIS — M542 Cervicalgia: Principal | ICD-10-CM

## 2012-04-02 DIAGNOSIS — I10 Essential (primary) hypertension: Secondary | ICD-10-CM | POA: Diagnosis present

## 2012-04-02 DIAGNOSIS — M436 Torticollis: Secondary | ICD-10-CM | POA: Insufficient documentation

## 2012-04-02 HISTORY — DX: Transient cerebral ischemic attack, unspecified: G45.9

## 2012-04-02 HISTORY — DX: Essential (primary) hypertension: I10

## 2012-04-02 HISTORY — DX: Malignant (primary) neoplasm, unspecified: C80.1

## 2012-04-02 LAB — BASIC METABOLIC PANEL
BUN: 19 mg/dL (ref 6–23)
Calcium: 9.2 mg/dL (ref 8.4–10.5)
GFR calc Af Amer: 88 mL/min — ABNORMAL LOW (ref >90)
GFR calc non Af Amer: 76 mL/min — ABNORMAL LOW (ref >90)
Potassium: 4.3 mEq/L (ref 3.5–5.1)

## 2012-04-02 LAB — PROTIME-INR
INR: 1.93 — ABNORMAL HIGH (ref 0.00–1.49)
Prothrombin Time: 21.3 seconds — ABNORMAL HIGH (ref 11.6–15.2)

## 2012-04-02 LAB — TROPONIN I: Troponin I: <0.3 ng/mL (ref <0.30)

## 2012-04-02 LAB — CBC
Hemoglobin: 14.2 g/dL (ref 12.0–15.0)
MCHC: 33.6 g/dL (ref 30.0–36.0)
Platelets: 144 10*3/uL — ABNORMAL LOW (ref 150–400)
RDW: 15 % (ref 11.5–15.5)

## 2012-04-02 MED ORDER — HYDROCHLOROTHIAZIDE 25 MG PO TABS
25.0000 mg | ORAL_TABLET | Freq: Every day | ORAL | Status: DC
  Administered 2012-04-02: 25 mg via ORAL
  Filled 2012-04-02 (×2): qty 1

## 2012-04-02 MED ORDER — WARFARIN SODIUM 1 MG PO TABS
1.0000 mg | ORAL_TABLET | Freq: Once | ORAL | Status: AC
  Administered 2012-04-02: 1 mg via ORAL
  Filled 2012-04-02: qty 1

## 2012-04-02 MED ORDER — DIAZEPAM 5 MG/ML IJ SOLN
2.5000 mg | Freq: Once | INTRAMUSCULAR | Status: AC
  Administered 2012-04-02: 2.5 mg via INTRAVENOUS
  Filled 2012-04-02: qty 2

## 2012-04-02 MED ORDER — ONDANSETRON HCL 4 MG/2ML IJ SOLN
4.0000 mg | Freq: Once | INTRAMUSCULAR | Status: AC
  Administered 2012-04-02: 4 mg via INTRAVENOUS
  Filled 2012-04-02: qty 2

## 2012-04-02 MED ORDER — OMEGA-3-ACID ETHYL ESTERS 1 G PO CAPS
1.0000 g | ORAL_CAPSULE | Freq: Every day | ORAL | Status: DC
  Administered 2012-04-02 – 2012-04-04 (×3): 1 g via ORAL
  Filled 2012-04-02 (×3): qty 1

## 2012-04-02 MED ORDER — ONDANSETRON HCL 4 MG PO TABS: 4.0000 mg | ORAL_TABLET | Freq: Four times a day (QID) | ORAL | Status: DC | PRN

## 2012-04-02 MED ORDER — HYDROMORPHONE HCL PF 1 MG/ML IJ SOLN
0.5000 mg | Freq: Once | INTRAMUSCULAR | Status: AC
  Administered 2012-04-02: 0.5 mg via INTRAVENOUS
  Filled 2012-04-02: qty 1

## 2012-04-02 MED ORDER — FENTANYL CITRATE 0.05 MG/ML IJ SOLN
25.0000 ug | Freq: Once | INTRAMUSCULAR | Status: AC
  Administered 2012-04-02: 25 ug via INTRAVENOUS
  Filled 2012-04-02: qty 2

## 2012-04-02 MED ORDER — OXYCODONE-ACETAMINOPHEN 5-325 MG PO TABS
1.0000 | ORAL_TABLET | ORAL | Status: DC | PRN
  Administered 2012-04-02 – 2012-04-04 (×5): 1 via ORAL
  Filled 2012-04-02 (×5): qty 1

## 2012-04-02 MED ORDER — CALCIUM CARBONATE 1250 (500 CA) MG PO TABS
1.0000 | ORAL_TABLET | Freq: Every day | ORAL | Status: DC
  Administered 2012-04-02 – 2012-04-04 (×3): 500 mg via ORAL
  Filled 2012-04-02 (×4): qty 1

## 2012-04-02 MED ORDER — IOHEXOL 350 MG/ML SOLN
100.0000 mL | Freq: Once | INTRAVENOUS | Status: AC | PRN
  Administered 2012-04-02: 100 mL via INTRAVENOUS

## 2012-04-02 MED ORDER — GADOBENATE DIMEGLUMINE 529 MG/ML IV SOLN
13.0000 mL | Freq: Once | INTRAVENOUS | Status: AC | PRN
  Administered 2012-04-02: 13 mL via INTRAVENOUS

## 2012-04-02 MED ORDER — ACETAMINOPHEN 325 MG PO TABS: 650.0000 mg | ORAL_TABLET | Freq: Four times a day (QID) | ORAL | Status: DC | PRN

## 2012-04-02 MED ORDER — ENOXAPARIN SODIUM 40 MG/0.4ML ~~LOC~~ SOLN
40.0000 mg | SUBCUTANEOUS | Status: DC
  Filled 2012-04-02: qty 0.4

## 2012-04-02 MED ORDER — IBUPROFEN 200 MG PO TABS: 400.0000 mg | ORAL_TABLET | Freq: Once | ORAL | Status: DC

## 2012-04-02 MED ORDER — METHOCARBAMOL 500 MG PO TABS
500.0000 mg | ORAL_TABLET | Freq: Three times a day (TID) | ORAL | Status: DC
  Administered 2012-04-02 (×2): 500 mg via ORAL
  Filled 2012-04-02 (×5): qty 1

## 2012-04-02 MED ORDER — FENTANYL CITRATE 0.05 MG/ML IJ SOLN: 25.0000 ug | INTRAMUSCULAR | Status: DC | PRN

## 2012-04-02 MED ORDER — SENNA 8.6 MG PO TABS
1.0000 | ORAL_TABLET | Freq: Every day | ORAL | Status: DC
  Administered 2012-04-02 – 2012-04-03 (×2): 8.6 mg via ORAL
  Filled 2012-04-02 (×2): qty 1

## 2012-04-02 MED ORDER — SODIUM CHLORIDE 0.9 % IV SOLN
INTRAVENOUS | Status: AC
  Administered 2012-04-02: 15:00:00 via INTRAVENOUS

## 2012-04-02 MED ORDER — KETOROLAC TROMETHAMINE 30 MG/ML IJ SOLN
30.0000 mg | Freq: Four times a day (QID) | INTRAMUSCULAR | Status: DC | PRN
  Administered 2012-04-02: 30 mg via INTRAVENOUS
  Filled 2012-04-02: qty 1

## 2012-04-02 MED ORDER — BISACODYL 5 MG PO TBEC: 10.0000 mg | DELAYED_RELEASE_TABLET | Freq: Every day | ORAL | Status: DC | PRN

## 2012-04-02 MED ORDER — ONDANSETRON HCL 4 MG/2ML IJ SOLN: 4.0000 mg | Freq: Three times a day (TID) | INTRAMUSCULAR | Status: DC | PRN

## 2012-04-02 MED ORDER — WARFARIN - PHARMACIST DOSING INPATIENT: Freq: Every day | Status: DC

## 2012-04-02 MED ORDER — LISINOPRIL 20 MG PO TABS
20.0000 mg | ORAL_TABLET | Freq: Every day | ORAL | Status: DC
  Administered 2012-04-02 – 2012-04-03 (×2): 20 mg via ORAL
  Filled 2012-04-02 (×3): qty 1

## 2012-04-02 MED ORDER — ONDANSETRON HCL 4 MG/2ML IJ SOLN: 4.0000 mg | Freq: Four times a day (QID) | INTRAMUSCULAR | Status: DC | PRN

## 2012-04-02 MED ORDER — ACETAMINOPHEN 650 MG RE SUPP: 650.0000 mg | Freq: Four times a day (QID) | RECTAL | Status: DC | PRN

## 2012-04-02 MED ORDER — SODIUM CHLORIDE 0.9 % IV SOLN: INTRAVENOUS | Status: DC

## 2012-04-02 NOTE — ED Notes (Signed)
RUE:[id number]<JW> Expected date:<BR> Expected time:<BR> Means of arrival:<BR> Comments:<BR> EMS/79 yo pain in neck

## 2012-04-02 NOTE — Progress Notes (Signed)
OT Cancellation Note  Patient Details Name: Julie Mosley MRN: [id number] DOB: 14-Nov-1926   Cancelled Treatment:    Reason Eval/Treat Not Completed: Other (comment)  Pt needs pain medicine.  Had several tests today.   Will likely return tomorrow.    Shraga Custard 04/02/2012, 4:03 PM Marica Otter, OTR/L [phone] 04/02/2012

## 2012-04-02 NOTE — ED Provider Notes (Signed)
History     CSN: [id number]  Arrival date & time 04/02/12  0421   First MD Initiated Contact with Patient 04/02/12 [phone]      Chief Complaint  Patient presents with  . Torticollis    (Consider location/radiation/quality/duration/timing/severity/associated sxs/prior treatment) HPI Julie Mosley is a 79 y.o. female who presents with neck pain. Patient is had some chronic back pain is had x-rays done on 03/20/2012 is being followed by her primary care physician, she actually has an appointment at 11 AM later today.  Patient presents due to worsening neck pain over the last 2 days. 2 days ago, patient was shoveling snow, but awoke with excruciating 9/10 sharp stabbing lower neck pain.  According to her son, it took her about 2 hours to figure out how to call for help, EMS eventually brought her to the emergency department. She denies any antecedent trauma, denies any falling. She is alert and oriented this time. She says the pain feels like cramping as well. She denies any loss of consciousness, numbness or tingling, inability to move any of her extremities, shortness of breath, chest pain, abdominal pain, vomiting or diarrhea, fevers or chills. Patient is had concurrent nausea especially when moving her head, she also has dizziness associated with moving her head. Patient does not have a history of vertigo. There is no ringing in the ears. Per Patient, has a history of Coumadin for TIA.   Past Medical History  Diagnosis Date  . TIA (transient ischemic attack)   . Hypertension   . Cancer     Past Surgical History  Procedure Laterality Date  . Hip surgery    . Cesarean section    . Breast lumpectomy      bilateral  . Skin biopsy      From left foot.    No family history on file.  History  Substance Use Topics  . Smoking status: Former Games developer  . Smokeless tobacco: Not on file  . Alcohol Use: No    OB History   Grav Para Term Preterm Abortions TAB SAB Ect Mult Living                   Review of Systems At least 10pt or greater review of systems completed and are negative except where specified in the HPI.  Allergies  Review of patient's allergies indicates no known allergies.  Home Medications   Current Outpatient Rx  Name  Route  Sig  Dispense  Refill  . ALENDRONATE SODIUM PO   Oral   Take by mouth once a week.         . calcium carbonate (OS-CAL - DOSED IN MG OF ELEMENTAL CALCIUM) 1250 MG tablet   Oral   Take 1 tablet by mouth daily.         Marland Kitchen glucosamine-chondroitin 500-400 MG tablet   Oral   Take 1 tablet by mouth 3 (three) times daily.         Marland Kitchen omega-3 acid ethyl esters (LOVAZA) 1 G capsule   Oral   Take 1 g by mouth daily.         Marland Kitchen PRESCRIPTION MEDICATION      Call [facility] for names of two blood pressure medications in the morning         . warfarin (COUMADIN) 1 MG tablet   Oral   Take 1 mg by mouth as directed. MWF,Sa 1mg  T,TH,Su take .5mg   BP 150/84  Pulse 93  Temp(Src) 98.1 F (36.7 C) (Oral)  Resp 17  Ht 5\' 7"  (1.702 m)  Wt 144 lb (65.318 kg)  BMI 22.55 kg/m2  SpO2 98%  Physical Exam  PHYSICAL EXAM: VITAL SIGNS:  . Filed Vitals:   04/02/12 1610 04/02/12 0553 04/02/12 0619 04/02/12 0635  BP: 165/95   150/84  Pulse: 99 99 79 93  Temp:      TempSrc:      Resp:      Height:      Weight:      SpO2: 99% 100% 100% 98%   CONSTITUTIONAL: Awake, oriented, appears non-toxic HENT: Atraumatic, normocephalic, oral mucosa pink and moist, airway patent. Nares patent without drainage. External ears normal. EYES: Conjunctiva clear, EOMI, PERRLA NECK: Trachea midline, extremely tender to palpation in the lower cervical spine C6 and C7 CARDIOVASCULAR: Normal heart rate, Normal rhythm, No murmurs, rubs, gallops PULMONARY/CHEST: Clear to auscultation, no rhonchi, wheezes, or rales. Symmetrical breath sounds. CHEST WALL: No lesions. Non-tender. ABDOMINAL: Non-distended, soft, non-tender - no rebound or  guarding.  BS normal. NEUROLOGIC: RU:EAVWUJ fields intact. PERRLA, EOMI.  Facial sensation equal to light touch bilaterally.  Good muscle bulk in the masseter muscle and good lateral movement of the jaw.  Facial expressions equal and good strength with smile/frown and puffed cheeks.  Hearing grossly intact to finger rub test.  Uvula, tongue are midline with no deviation. Symmetrical palate elevation.  Trapezius and SCM muscles are 5/5 strength bilaterally.   Strength: 5/5 strength flexors and extensors in the upper and lower extremities.  Grip strength, finger adduction/abduction 5/5. Sensation: Sensation intact distally to light touch Cerebellar: No dysmetria with finger to nose rapid alternating hand movements and heels to shin testing. Gait and Station: Cannot ambulate patient secondary to safety. EXTREMITIES: No clubbing, cyanosis, or edema. Arthritic changes to the hands. SKIN: Warm, Dry, No erythema, No rash   ED Course  Procedures (including critical care time)  Labs Reviewed  BASIC METABOLIC PANEL - Abnormal; Notable for the following:    Glucose, Bld 146 (*)    GFR calc non Af Amer 76 (*)    GFR calc Af Amer 88 (*)    All other components within normal limits  CBC - Abnormal; Notable for the following:    Platelets 144 (*)    All other components within normal limits  PROTIME-INR - Abnormal; Notable for the following:    Prothrombin Time 21.3 (*)    INR 1.93 (*)    All other components within normal limits   Ct Head Wo Contrast  04/02/2012  *RADIOLOGY REPORT*  Clinical Data:  Neck pain.  Weakness.  Headache.  CT HEAD WITHOUT CONTRAST CT CERVICAL SPINE WITHOUT CONTRAST  Technique:  Multidetector CT imaging of the head and cervical spine was performed following the standard protocol without intravenous contrast.  Multiplanar CT image reconstructions of the cervical spine were also generated.  Comparison:  Radiography 03/20/2012.  CT HEAD  Findings: The brain shows generalized  atrophy.  There is extensive chronic appearing low density throughout the cerebral hemispheric white matter consistent with chronic small vessel disease.  There is chronic atrophy at the right temporal tip.  No evidence of acute infarction, mass lesion, hemorrhage, hydrocephalus or extra-axial collection.  Visualized sinuses, middle ears and mastoids are clear.  IMPRESSION: No identifiably acute brain abnormality.  Atrophy and extensive chronic appearing small vessel change throughout the brain.  Old focal atrophy/gliosis in the right temporal tip.  CT  CERVICAL SPINE  Findings: Alignment is normal.  No evidence of fracture.  There is degenerative arthritis at the articulation of the anterior arch of C1 and the dens.  There is facet arthropathy on the right at C3-4 and T1-2 and on the left at C2-3, C3-4 and C7-T1.  There is degenerative spondylosis throughout the cervical region with disc space narrowing and marginal osteophytes at C2-3, C3-4, C4-5, C5-6 and C6-7.  The canal is narrowed throughout that region but there is no definable cord compression.  There is foraminal encroachment through the region by osteophytes that could be a cause of focal neural compression.  IMPRESSION: Extensive chronic degenerative changes throughout the cervical spine.  No identifiably acute or traumatic finding.  No critical stenosis of the central canal is discernible with CT.   Original Report Authenticated By: Paulina Fusi, M.D.    Ct Angio Neck W/cm &/or Wo/cm  04/02/2012  *RADIOLOGY REPORT*  Clinical Data:  Severe neck pain.  Assess for vascular injury.  CT ANGIOGRAPHY NECK  Technique:  Multidetector CT imaging of the neck was performed using the standard protocol during bolus administration of intravenous contrast.  Multiplanar CT image reconstructions including MIPs were obtained to evaluate the vascular anatomy. Carotid stenosis measurements (when applicable) are obtained utilizing NASCET criteria, using the distal internal  carotid diameter as the denominator.  Contrast: OMNIPAQUE IOHEXOL 350 MG/ML SOLN  Comparison:   None.  Findings:  Lung apices show pleural and parenchymal scarring with some pleural calcification.  There is extensive chronic atherosclerosis of the aorta with wall calcification.  No focal aneurysm or dissection.  Branching pattern of the brachiocephalic vessels from the arch is normal.  There is atherosclerotic disease at the brachiocephalic vessel origins but no stenosis.  Right common carotid artery is widely patent to to the distal common carotid just proximal to the bifurcation.  There is atherosclerotic calcification at that location but luminal narrowing only measuring 20%.  The carotid bifurcation itself is widely patent with no stenosis of the ICA.  The cervical ICA is tortuous and shows mild dilatation and irregularity at the level of C1 and C2 consistent with fibromuscular disease.  No evidence of a focal pseudo aneurysm or dissection.  Left common carotid artery is widely patent to the bifurcation region.  There is calcific atherosclerosis but no stenosis.  The proximal internal carotid artery is widely patent.  The cervical internal carotid artery on this side similarly shows tortuosity and mild dilatation consistent with fibromuscular disease.  No focal aneurysm or dissection.  Both vertebral artery origins are widely patent.  Both vertebral arteries are patent through the neck.  No evidence of stenosis or dissection.  No soft tissue lesion of the neck is evident.   Review of the MIP images confirms the above findings.  IMPRESSION: No acute vascular pathology.  Nonstenotic atherosclerosis in both carotid bifurcation regions but no narrowing of either internal carotid artery.  Tortuosity of both cervical internal carotid arteries with mild dilatation and irregularity consistent with fibromuscular disease. On neither side is there evidence of a frank pseudoaneurysm, any dissection or any flow  limiting stenosis.  Therefore, this is likely longstanding and incidental.   Original Report Authenticated By: Paulina Fusi, M.D.    Ct Cervical Spine Wo Contrast  04/02/2012  *RADIOLOGY REPORT*  Clinical Data:  Neck pain.  Weakness.  Headache.  CT HEAD WITHOUT CONTRAST CT CERVICAL SPINE WITHOUT CONTRAST  Technique:  Multidetector CT imaging of the head and cervical spine was performed  following the standard protocol without intravenous contrast.  Multiplanar CT image reconstructions of the cervical spine were also generated.  Comparison:  Radiography 03/20/2012.  CT HEAD  Findings: The brain shows generalized atrophy.  There is extensive chronic appearing low density throughout the cerebral hemispheric white matter consistent with chronic small vessel disease.  There is chronic atrophy at the right temporal tip.  No evidence of acute infarction, mass lesion, hemorrhage, hydrocephalus or extra-axial collection.  Visualized sinuses, middle ears and mastoids are clear.  IMPRESSION: No identifiably acute brain abnormality.  Atrophy and extensive chronic appearing small vessel change throughout the brain.  Old focal atrophy/gliosis in the right temporal tip.  CT CERVICAL SPINE  Findings: Alignment is normal.  No evidence of fracture.  There is degenerative arthritis at the articulation of the anterior arch of C1 and the dens.  There is facet arthropathy on the right at C3-4 and T1-2 and on the left at C2-3, C3-4 and C7-T1.  There is degenerative spondylosis throughout the cervical region with disc space narrowing and marginal osteophytes at C2-3, C3-4, C4-5, C5-6 and C6-7.  The canal is narrowed throughout that region but there is no definable cord compression.  There is foraminal encroachment through the region by osteophytes that could be a cause of focal neural compression.  IMPRESSION: Extensive chronic degenerative changes throughout the cervical spine.  No identifiably acute or traumatic finding.  No critical  stenosis of the central canal is discernible with CT.   Original Report Authenticated By: Paulina Fusi, M.D.      1. Neck pain   2. Dizziness   3. Nausea   4. H/O: CVA (cerebrovascular accident)      Medications  diazepam (VALIUM) injection 2.5 mg (2.5 mg Intravenous Given 04/02/12 0542)  HYDROmorphone (DILAUDID) injection 0.5 mg (0.5 mg Intravenous Given 04/02/12 0539)  ondansetron (ZOFRAN) injection 4 mg (4 mg Intravenous Given 04/02/12 0537)  iohexol (OMNIPAQUE) 350 MG/ML injection 100 mL (100 mLs Intravenous Contrast Given 04/02/12 0708)  fentaNYL (SUBLIMAZE) injection 25 mcg (25 mcg Intravenous Given 04/02/12 0903)    MDM  Julie Mosley is a 79 y.o. female presenting with severe neck pain. Based on the patient's history of shoveling snow, patient is likely having some musculoskeletal neck pain, this does seem somewhat out of proportion to exam, in addition, son says the patient typically does not complaining about musculoskeletal pain. Patient says she just cannot stand it so called EMS. This raises the concern for arterial dissection in the patient's neck she does have a long-standing history of hypertension, she is currently taking Coumadin. Obtain basic labs, CT and CTA of the neck. During medication administration, patient had an apneic episode requiring bagging.  Patient did desaturate become hypoxic, this responded very quickly to oxygenation.  I do not think this patient has meningitis, she is resistant to moving her neck, however think this likely secondary to musculoskeletal pain - CT angiogram of the neck shows no acute vascular pathology specifically no evidence of pseudoaneurysm or dissection or flow-limiting stenosis.  CT of the neck and head show extensive chronic degenerative arthropathy to the cervical spine, there is no identifiable acute or dramatic finding. There is no acute critical stenosis of the central canal.  On reexam, patient still having severe neck pain is  resistant to moving her head. Patient is not ambulatory at this time. Patient lives by herself.  Based on the patient's poor reaction to medication, social status, and history presentation of severe neck pain with  nausea and dizziness, we'll elect further workup. Discussed with hospitalist for admission. Patient has just been given some more pain medicine, 25 mcg of fentanyl, patient will be admitted stable and in good condition to telemetry.  Patient's primary care physician is Dr. Kerry Fort, MD 04/02/12 [phone]

## 2012-04-02 NOTE — Progress Notes (Addendum)
ANTICOAGULATION CONSULT NOTE - Initial Consult  Pharmacy Consult for Coumadin Indication: h/o CVA  Allergies  Allergen Reactions  . Valium (Diazepam) Other (See Comments)    Stopped breathing    Patient Measurements: Height: 5\' 7"  (170.2 cm) Weight: 144 lb (65.318 kg) IBW/kg (Calculated) : 61.6  Vital Signs: Temp: 98.5 F (36.9 C) (03/10 1205) Temp src: Oral (03/10 1205) BP: 144/85 mmHg (03/10 1205) Pulse Rate: 84 (03/10 1205)  Labs:  Recent Labs  04/02/12 0536  HGB 14.2  HCT 42.2  PLT 144*  LABPROT 21.3*  INR 1.93*  CREATININE 0.76    Estimated Creatinine Clearance: 51.8 ml/min (by C-G formula based on Cr of 0.76).   Medical History: Past Medical History  Diagnosis Date  . TIA (transient ischemic attack)   . Hypertension   . Cancer    Assessment: 68 yof on Coumadin PTA for h/o TIA presented 3/10 with c/o severe neck pain, admit for further work-up.  Coumadin dose PTA = 0.5 mg on T,Th, Sun and 1mg  other days of the week. Last dose 3/9.  INR was 1.93 on admit 3/10. MD ordered to resume Coumadin inpatient.   H/H wnl, plts 144K (will monitor).  Patient with Lovenox on board (no dose given during this admit)  Goal of Therapy:  INR 2-3 Monitor platelets by anticoagulation protocol: Yes   Plan:   D/C Lovenox  Coumadin 1mg  po x 1 tonight  Daily PT/INR  Pharmacy will f/u  Geoffry Paradise, PharmD, BCPS Pager: [phone] 1:33 PM Pharmacy #: [id number]

## 2012-04-02 NOTE — H&P (Addendum)
[facility] History and Physical  Julie Mosley:[id number] DOB: January 26, 1926 DOA: 04/02/2012  Referring physician: Rulon Abide PCP: Cain Saupe, MD  Specialists:   Chief Complaint: severe neck pain  HPI: Julie Mosley is a 79 y.o. female with PMH as listed below as well as back pain followed by PCP who presents with the above complaints. It is noted she had plain films of back done on 2/25 which showed kyphosis, DJD and she was to follow up with her PCP. A few days ago she reports shovelling some snow and also reports that  Over the weekend she slept off in the chair and when she woke up she felt like she had a 'crook' in her neck- was having some neck pain. Then when she woke up last PM she was unable to get out of bed and it took her about 2hrs to figure out how to call EMS. She admits to nausea but denies vomiting. It is noted that per EDP pt reported dizziness, but denies, repeately stating that she has had no dizzy spells. In the ED CT cervical spine showed Extensive chronic degenerative changes throughout the cervical spine. No identifiably acute or traumatic finding, and CT head was neg, and CTA neck showed No acute vascular pathology. She denies chest pain, prasthesias, focal weakness, dysphagia and no blurry vision.  In the EDP pt had an apneic episode after dilaudid, also noted she had valium in ED. She was' bagged' for a few secs and spontaneous breathing returned- did not loose her pulse. She is admitted for further eval and management     Review of Systems: The patient denies anorexia, fever, weight loss,, vision loss, decreased hearing, hoarseness, chest pain, syncope, dyspnea on exertion, peripheral edema, balance deficits, hemoptysis, abdominal pain, melena, hematochezia, severe indigestion/heartburn, hematuria, incontinence, genital sores, muscle weakness, suspicious skin lesions, transient blindness, difficulty walking, depression, unusual weight change, abnormal bleeding, enlarged  lymph   Past Medical History  Diagnosis Date  . TIA (transient ischemic attack)   . Hypertension   . Cancer    Past Surgical History  Procedure Laterality Date  . Hip surgery    . Cesarean section    . Breast lumpectomy      bilateral  . Skin biopsy      From left foot.   Social History:  reports that she quit smoking about 37 years ago. She has never used smokeless tobacco. She reports that she does not drink alcohol or use illicit drugs. where does patient live--home  Allergies  Allergen Reactions  . Valium (Diazepam) Other (See Comments)    Stopped breathing   Family history- bro and sis -had strokes, and mother -heart problems   Prior to Admission medications   Medication Sig Start Date End Date Taking? Authorizing Provider  ALENDRONATE SODIUM PO Take by mouth once a week.   Yes Historical Provider, MD  calcium carbonate (OS-CAL - DOSED IN MG OF ELEMENTAL CALCIUM) 1250 MG tablet Take 1 tablet by mouth daily.   Yes Historical Provider, MD  glucosamine-chondroitin 500-400 MG tablet Take 1 tablet by mouth 3 (three) times daily.   Yes Historical Provider, MD  hydrochlorothiazide (HYDRODIURIL) 25 MG tablet Take 25 mg by mouth daily.   Yes Historical Provider, MD  lisinopril (PRINIVIL,ZESTRIL) 20 MG tablet Take 20 mg by mouth daily.   Yes Historical Provider, MD  omega-3 acid ethyl esters (LOVAZA) 1 G capsule Take 1 g by mouth daily.   Yes Historical Provider, MD  warfarin (COUMADIN)  1 MG tablet Take 1 mg by mouth as directed. MWF,Sa 1mg  T,TH,Su take .5mg    Yes Historical Provider, MD   Physical Exam: Filed Vitals:   04/02/12 0635 04/02/12 0900 04/02/12 0947 04/02/12 1205  BP: 150/84  141/80 144/85  Pulse: 93 93 91 84  Temp:    98.5 F (36.9 C)  TempSrc:    Oral  Resp:   16 18  Height:      Weight:      SpO2: 98% 95% 96% 97%   Constitutional: Vital signs reviewed.  Patient is a well-developed and well-nourished in no acute distress and cooperative with exam. Alert  and oriented x3.  Head: Normocephalic and atraumatic Mouth: no erythema or exudates, MMM Eyes: PERRL, EOMI, conjunctivae normal, No scleral icterus.  Neck: ROM decreased by pain, +tenderness, over cervical spine, No JVD, mass, thyromegaly, or carotid bruit present.  Cardiovascular: RRR, S1 normal, S2 normal, no MRG, pulses symmetric and intact bilaterally Pulmonary/Chest: CTAB, no wheezes, rales, or rhonchi Abdominal: Soft. Non-tender, non-distended, bowel sounds are normal, no masses, organomegaly, or guarding present.  Neurological: A&O x3, Strength is normal and symmetric bilaterally, cranial nerve II-XII are grossly intact, no focal motor deficit, sensory intact to light touch bilaterally.  Skin: Warm, dry and intact. No rash  Psychiatric: Normal mood and affect. speech and behavior is normal.    Labs on Admission:  Basic Metabolic Panel:  Recent Labs Lab 04/02/12 0536  NA 137  K 4.3  CL 98  CO2 27  GLUCOSE 146*  BUN 19  CREATININE 0.76  CALCIUM 9.2   Liver Function Tests: No results found for this basename: AST, ALT, ALKPHOS, BILITOT, PROT, ALBUMIN,  in the last 168 hours No results found for this basename: LIPASE, AMYLASE,  in the last 168 hours No results found for this basename: AMMONIA,  in the last 168 hours CBC:  Recent Labs Lab 04/02/12 0536  WBC 8.8  HGB 14.2  HCT 42.2  MCV 93.0  PLT 144*   Cardiac Enzymes: No results found for this basename: CKTOTAL, CKMB, CKMBINDEX, TROPONINI,  in the last 168 hours  BNP (last 3 results) No results found for this basename: PROBNP,  in the last 8760 hours CBG: No results found for this basename: GLUCAP,  in the last 168 hours  Radiological Exams on Admission: Ct Head Wo Contrast  04/02/2012  *RADIOLOGY REPORT*  Clinical Data:  Neck pain.  Weakness.  Headache.  CT HEAD WITHOUT CONTRAST CT CERVICAL SPINE WITHOUT CONTRAST  Technique:  Multidetector CT imaging of the head and cervical spine was performed following the  standard protocol without intravenous contrast.  Multiplanar CT image reconstructions of the cervical spine were also generated.  Comparison:  Radiography 03/20/2012.  CT HEAD  Findings: The brain shows generalized atrophy.  There is extensive chronic appearing low density throughout the cerebral hemispheric white matter consistent with chronic small vessel disease.  There is chronic atrophy at the right temporal tip.  No evidence of acute infarction, mass lesion, hemorrhage, hydrocephalus or extra-axial collection.  Visualized sinuses, middle ears and mastoids are clear.  IMPRESSION: No identifiably acute brain abnormality.  Atrophy and extensive chronic appearing small vessel change throughout the brain.  Old focal atrophy/gliosis in the right temporal tip.  CT CERVICAL SPINE  Findings: Alignment is normal.  No evidence of fracture.  There is degenerative arthritis at the articulation of the anterior arch of C1 and the dens.  There is facet arthropathy on the right at C3-4 and  T1-2 and on the left at C2-3, C3-4 and C7-T1.  There is degenerative spondylosis throughout the cervical region with disc space narrowing and marginal osteophytes at C2-3, C3-4, C4-5, C5-6 and C6-7.  The canal is narrowed throughout that region but there is no definable cord compression.  There is foraminal encroachment through the region by osteophytes that could be a cause of focal neural compression.  IMPRESSION: Extensive chronic degenerative changes throughout the cervical spine.  No identifiably acute or traumatic finding.  No critical stenosis of the central canal is discernible with CT.   Original Report Authenticated By: Paulina Fusi, M.D.    Ct Angio Neck W/cm &/or Wo/cm  04/02/2012  *RADIOLOGY REPORT*  Clinical Data:  Severe neck pain.  Assess for vascular injury.  CT ANGIOGRAPHY NECK  Technique:  Multidetector CT imaging of the neck was performed using the standard protocol during bolus administration of intravenous contrast.   Multiplanar CT image reconstructions including MIPs were obtained to evaluate the vascular anatomy. Carotid stenosis measurements (when applicable) are obtained utilizing NASCET criteria, using the distal internal carotid diameter as the denominator.  Contrast: OMNIPAQUE IOHEXOL 350 MG/ML SOLN  Comparison:   None.  Findings:  Lung apices show pleural and parenchymal scarring with some pleural calcification.  There is extensive chronic atherosclerosis of the aorta with wall calcification.  No focal aneurysm or dissection.  Branching pattern of the brachiocephalic vessels from the arch is normal.  There is atherosclerotic disease at the brachiocephalic vessel origins but no stenosis.  Right common carotid artery is widely patent to to the distal common carotid just proximal to the bifurcation.  There is atherosclerotic calcification at that location but luminal narrowing only measuring 20%.  The carotid bifurcation itself is widely patent with no stenosis of the ICA.  The cervical ICA is tortuous and shows mild dilatation and irregularity at the level of C1 and C2 consistent with fibromuscular disease.  No evidence of a focal pseudo aneurysm or dissection.  Left common carotid artery is widely patent to the bifurcation region.  There is calcific atherosclerosis but no stenosis.  The proximal internal carotid artery is widely patent.  The cervical internal carotid artery on this side similarly shows tortuosity and mild dilatation consistent with fibromuscular disease.  No focal aneurysm or dissection.  Both vertebral artery origins are widely patent.  Both vertebral arteries are patent through the neck.  No evidence of stenosis or dissection.  No soft tissue lesion of the neck is evident.   Review of the MIP images confirms the above findings.  IMPRESSION: No acute vascular pathology.  Nonstenotic atherosclerosis in both carotid bifurcation regions but no narrowing of either internal carotid artery.  Tortuosity  of both cervical internal carotid arteries with mild dilatation and irregularity consistent with fibromuscular disease. On neither side is there evidence of a frank pseudoaneurysm, any dissection or any flow limiting stenosis.  Therefore, this is likely longstanding and incidental.   Original Report Authenticated By: Paulina Fusi, M.D.    Ct Cervical Spine Wo Contrast  04/02/2012  *RADIOLOGY REPORT*  Clinical Data:  Neck pain.  Weakness.  Headache.  CT HEAD WITHOUT CONTRAST CT CERVICAL SPINE WITHOUT CONTRAST  Technique:  Multidetector CT imaging of the head and cervical spine was performed following the standard protocol without intravenous contrast.  Multiplanar CT image reconstructions of the cervical spine were also generated.  Comparison:  Radiography 03/20/2012.  CT HEAD  Findings: The brain shows generalized atrophy.  There is extensive chronic appearing  low density throughout the cerebral hemispheric white matter consistent with chronic small vessel disease.  There is chronic atrophy at the right temporal tip.  No evidence of acute infarction, mass lesion, hemorrhage, hydrocephalus or extra-axial collection.  Visualized sinuses, middle ears and mastoids are clear.  IMPRESSION: No identifiably acute brain abnormality.  Atrophy and extensive chronic appearing small vessel change throughout the brain.  Old focal atrophy/gliosis in the right temporal tip.  CT CERVICAL SPINE  Findings: Alignment is normal.  No evidence of fracture.  There is degenerative arthritis at the articulation of the anterior arch of C1 and the dens.  There is facet arthropathy on the right at C3-4 and T1-2 and on the left at C2-3, C3-4 and C7-T1.  There is degenerative spondylosis throughout the cervical region with disc space narrowing and marginal osteophytes at C2-3, C3-4, C4-5, C5-6 and C6-7.  The canal is narrowed throughout that region but there is no definable cord compression.  There is foraminal encroachment through the region  by osteophytes that could be a cause of focal neural compression.  IMPRESSION: Extensive chronic degenerative changes throughout the cervical spine.  No identifiably acute or traumatic finding.  No critical stenosis of the central canal is discernible with CT.   Original Report Authenticated By: Paulina Fusi, M.D.      Assessment/Plan Active Problems:   Neck pain//Torticollis -As discussed above, imaging studies of neck neg for acute findings as above, given ? Dizziness will obtain mri brain to eval for CVA  -also obtain CEs and follow -will treat with NSAIDS, muscle relaxants,and narcotics as needed -PT consult, follow   Nausea -CEs as above, UA and follow   Hypertension -continue outpt meds, monitor and further treat accordingly   H/O: CVA (cerebrovascular accident) -continue coumadin, monitor INR - pharmacy following Apneic Episode  -As above, likely secondary to meds. Continue to monitor. -resolved   Code Status: full Family Communication: Disposition Plan: admit to Tele  Time spent: >51min  Kela Millin [facility] Pager [phone]  If 7PM-7AM, please contact night-coverage [email or web address] Password [email or web address] 04/02/2012, 1:10 PM

## 2012-04-02 NOTE — ED Notes (Signed)
Pt was found to having trouble breathing and blue in the face.  Pt was given O2 and encouraged to breath.  Pt responded and is now pink.  Pt is now a/o x 4. Pt on a nonrebreather.

## 2012-04-02 NOTE — ED Notes (Signed)
Patient transported to CT 

## 2012-04-02 NOTE — ED Notes (Signed)
Per EMS report: pt from home: pt reports having neck pain x 2 days.  Pt reports neck pain possibly d/t sleeping on it wrong.  Pt has an appt with doctor today at 11am but could not take the pain anymore.  Pt reports taking muscle relaxing medication yesterday but it has not helped her pain.  Pt describes pain as a severe "muscle cramps."  BP: 180/100, HR: 100, RR: 18

## 2012-04-03 DIAGNOSIS — R52 Pain, unspecified: Secondary | ICD-10-CM

## 2012-04-03 LAB — TROPONIN I: Troponin I: <0.3 ng/mL (ref <0.30)

## 2012-04-03 LAB — BASIC METABOLIC PANEL
BUN: 19 mg/dL (ref 6–23)
Chloride: 101 mEq/L (ref 96–112)
GFR calc Af Amer: 88 mL/min — ABNORMAL LOW (ref >90)
Potassium: 4.2 mEq/L (ref 3.5–5.1)

## 2012-04-03 LAB — PROTIME-INR: Prothrombin Time: 24.2 seconds — ABNORMAL HIGH (ref 11.6–15.2)

## 2012-04-03 MED ORDER — WARFARIN SODIUM 1 MG PO TABS
1.0000 mg | ORAL_TABLET | ORAL | Status: DC
  Filled 2012-04-03: qty 1

## 2012-04-03 MED ORDER — MORPHINE SULFATE 2 MG/ML IJ SOLN: 2.0000 mg | INTRAMUSCULAR | Status: DC | PRN

## 2012-04-03 MED ORDER — METHOCARBAMOL 750 MG PO TABS
750.0000 mg | ORAL_TABLET | Freq: Three times a day (TID) | ORAL | Status: DC
  Administered 2012-04-03 – 2012-04-04 (×4): 750 mg via ORAL
  Filled 2012-04-03 (×6): qty 1

## 2012-04-03 MED ORDER — WARFARIN 0.5 MG HALF TABLET
0.5000 mg | ORAL_TABLET | ORAL | Status: DC
  Administered 2012-04-03: 0.5 mg via ORAL
  Filled 2012-04-03: qty 1

## 2012-04-03 NOTE — Progress Notes (Signed)
Clinical Social Work Department BRIEF PSYCHOSOCIAL ASSESSMENT 04/03/2012  Patient:  Julie Mosley, Julie Mosley     Account Number:  [id number]     Admit date:  04/02/2012  Clinical Social Worker:  Hattie Perch  Date/Time:  04/03/2012 12:00 M  Referred by:  Physician  Date Referred:  04/03/2012 Referred for  SNF Placement   Other Referral:   Interview type:  Patient Other interview type:   children at bedside    PSYCHOSOCIAL DATA Living Status:  FAMILY Admitted from facility:   Level of care:   Primary support name:  Darrin Vaugn Primary support relationship to patient:  CHILD, ADULT Degree of support available:   excellent    CURRENT CONCERNS Current Concerns  Post-Acute Placement   Other Concerns:    SOCIAL WORK ASSESSMENT / PLAN CSW met with patient and sons at bedside. Patient is alert and oriented X3. patient in need of snf placement. Patient is agreeable to being faxed out and recieving bed offers.   Assessment/plan status:   Other assessment/ plan:   Information/referral to community resources:    PATIENT'S/FAMILY'S RESPONSE TO PLAN OF CARE: agreeable to being faxed out and recieving bed offers.

## 2012-04-03 NOTE — Progress Notes (Signed)
ANTICOAGULATION CONSULT NOTE - Follow Up Consult  Pharmacy Consult for Coumadin Indication: h/o CVA  Allergies  Allergen Reactions  . Valium (Diazepam) Other (See Comments)    Stopped breathing    Patient Measurements: Height: 5\' 7"  (170.2 cm) Weight: 144 lb 8 oz (65.545 kg) IBW/kg (Calculated) : 61.6  Vital Signs: Temp: 98.3 F (36.8 C) (03/10 2118) Temp src: Oral (03/10 2118) BP: 115/71 mmHg (03/10 2118) Pulse Rate: 80 (03/10 2118)  Labs:  Recent Labs  04/02/12 0536 04/02/12 1916 04/03/12 0025 04/03/12 0637  HGB 14.2  --   --   --   HCT 42.2  --   --   --   PLT 144*  --   --   --   LABPROT 21.3*  --   --  24.2*  INR 1.93*  --   --  2.29*  CREATININE 0.76  --   --  0.77  TROPONINI  --  <0.30 <0.30 <0.30    Estimated Creatinine Clearance: 51.8 ml/min (by C-G formula based on Cr of 0.77).   Medical History: Past Medical History  Diagnosis Date  . TIA (transient ischemic attack)   . Hypertension   . Cancer    Assessment: 76 yof on Coumadin PTA for h/o TIA presented 3/10 with c/o severe neck pain, admit for further work-up.  Coumadin dose PTA = 0.5 mg on T,Th, Sun and 1mg  other days of the week. Last dose 3/9.  INR was 1.93 on admit 3/10. MD ordered to resume Coumadin inpatient on 3/10.   INR therapeutic at 2.29 after resumed on home dose.   H/H wnl, plts 144K on CBC 3/10 (will monitor).  No bleeding/complications noted from anticoagulation therapy.   Goal of Therapy:  INR 2-3 Monitor platelets by anticoagulation protocol: Yes   Plan:   Coumadin 0.5 mg on Tues, Thurs, Sunday  Coumadin 1mg  on Mon, Wed, Frid, Sat  Daily PT/INR  Pharmacy will f/u  Geoffry Paradise, PharmD, BCPS Pager: [phone] 9:00 AM Pharmacy #: [phone]

## 2012-04-03 NOTE — Progress Notes (Signed)
UR completed 

## 2012-04-03 NOTE — Progress Notes (Signed)
Pt stated that it is okay to speak with either son regarding her care.

## 2012-04-03 NOTE — Progress Notes (Signed)
[facility] PROGRESS NOTE  Julie Mosley ZOX:[id number] DOB: 05-06-1926 DOA: 04/02/2012 PCP: Cain Saupe, MD  Brief History 79 year old female with a history of stroke, hypertension, and atrial fibrillation on Coumadin presented to the ED with worsening neck pain. The patient was shoveling snow last week and suffered worsening neck pain. Short see her primary care physician. Plain films of her cervical spine were negative for any fractures or malalignment. Plain films of the thoracic spine were also negative for any fractures. Over the weekend, the patient slept in her chair, which she does on a regular basis. She woke up in the evening with a "crick" in her neck which was so painful that she was unable to get up. The patient denied any chest discomfort, shortness of breath, nausea, vomiting, focal extremity weakness, visual disturbance. In emergency, the patient received hydromorphone IV and Valium. She ended up having an appendectomy episode requiring ambu bagging.  She never lost her pulse. She had return of her respirations spontaneously. The patient is alert and oriented x3 at this point without any focal neurologic deficits. The patient was admitted for pain control and physical therapy. Assessment/Plan: Uncontrolled pain -Requiring intravenous opioids -I have educated the patient that she needs to ask for the opioids more frequently -Continue muscle relaxer -Physical therapy Neck pain status post mechanical fall -CT brain without acute changes -MRI brain showed old right temporal lobe infarcts, small vessel disease -CT cervical spine revealed no fractures, extensive OA -CT angiogram of the neck negative for any acute vascular pathology; No aneurysm or pseudoaneurysm -troponins neg Chronic Atrial fibrillation with history of stroke -Rate controlled -Continue warfarin Hypertension -Continue lisinopril -HCTZ held   Family Communication:   Pt at beside Disposition Plan:   SNF  04/03/12      Procedures/Studies: Dg Cervical Spine Complete  03/20/2012  *RADIOLOGY REPORT*  Clinical Data: Neck pain and right shoulder pain, no trauma  CERVICAL SPINE - COMPLETE 4+ VIEW  Comparison: None.  Findings: The cervical vertebrae are in normal alignment.  However there is significant degenerative joint disease diffusely throughout the cervical vertebrae with loss of disc space, sclerosis, and spurring.  No prevertebral soft tissue swelling is seen.  There is some foraminal narrowing at C3-4 and C6-7 level, with the remainder of the foramen being patent.  There is degenerative change diffusely throughout the facet joints.  The lung apices are clear, and the odontoid process is intact.  IMPRESSION: Diffuse degenerative disc disease.  Normal alignment.  Mild foraminal narrowing at C3-4 and C6-7   Original Report Authenticated By: Dwyane Dee, M.D.    Dg Thoracic Spine W/swimmers  03/20/2012  *RADIOLOGY REPORT*  Clinical Data: Upper back pain, no trauma  THORACIC SPINE - 2 VIEW + SWIMMERS  Comparison: Chest x-ray of 11/25/2007  Findings: There is a slight thoracic kyphosis present with mild degenerative change in the mid to lower thoracic spine.  There is some loss of disc spaces diffusely with mild anterior osteophyte formation and sclerosis.  However, no compression deformity is seen.  IMPRESSION: Mild thoracic kyphosis with diffuse degenerative change.  No acute compression deformity.   Original Report Authenticated By: Dwyane Dee, M.D.    Ct Head Wo Contrast  04/02/2012  *RADIOLOGY REPORT*  Clinical Data:  Neck pain.  Weakness.  Headache.  CT HEAD WITHOUT CONTRAST CT CERVICAL SPINE WITHOUT CONTRAST  Technique:  Multidetector CT imaging of the head and cervical spine was performed following the standard protocol without intravenous contrast.  Multiplanar CT image reconstructions  of the cervical spine were also generated.  Comparison:  Radiography 03/20/2012.  CT HEAD  Findings: The brain shows  generalized atrophy.  There is extensive chronic appearing low density throughout the cerebral hemispheric white matter consistent with chronic small vessel disease.  There is chronic atrophy at the right temporal tip.  No evidence of acute infarction, mass lesion, hemorrhage, hydrocephalus or extra-axial collection.  Visualized sinuses, middle ears and mastoids are clear.  IMPRESSION: No identifiably acute brain abnormality.  Atrophy and extensive chronic appearing small vessel change throughout the brain.  Old focal atrophy/gliosis in the right temporal tip.  CT CERVICAL SPINE  Findings: Alignment is normal.  No evidence of fracture.  There is degenerative arthritis at the articulation of the anterior arch of C1 and the dens.  There is facet arthropathy on the right at C3-4 and T1-2 and on the left at C2-3, C3-4 and C7-T1.  There is degenerative spondylosis throughout the cervical region with disc space narrowing and marginal osteophytes at C2-3, C3-4, C4-5, C5-6 and C6-7.  The canal is narrowed throughout that region but there is no definable cord compression.  There is foraminal encroachment through the region by osteophytes that could be a cause of focal neural compression.  IMPRESSION: Extensive chronic degenerative changes throughout the cervical spine.  No identifiably acute or traumatic finding.  No critical stenosis of the central canal is discernible with CT.   Original Report Authenticated By: Paulina Fusi, M.D.    Ct Angio Neck W/cm &/or Wo/cm  04/02/2012  *RADIOLOGY REPORT*  Clinical Data:  Severe neck pain.  Assess for vascular injury.  CT ANGIOGRAPHY NECK  Technique:  Multidetector CT imaging of the neck was performed using the standard protocol during bolus administration of intravenous contrast.  Multiplanar CT image reconstructions including MIPs were obtained to evaluate the vascular anatomy. Carotid stenosis measurements (when applicable) are obtained utilizing NASCET criteria, using the  distal internal carotid diameter as the denominator.  Contrast: OMNIPAQUE IOHEXOL 350 MG/ML SOLN  Comparison:   None.  Findings:  Lung apices show pleural and parenchymal scarring with some pleural calcification.  There is extensive chronic atherosclerosis of the aorta with wall calcification.  No focal aneurysm or dissection.  Branching pattern of the brachiocephalic vessels from the arch is normal.  There is atherosclerotic disease at the brachiocephalic vessel origins but no stenosis.  Right common carotid artery is widely patent to to the distal common carotid just proximal to the bifurcation.  There is atherosclerotic calcification at that location but luminal narrowing only measuring 20%.  The carotid bifurcation itself is widely patent with no stenosis of the ICA.  The cervical ICA is tortuous and shows mild dilatation and irregularity at the level of C1 and C2 consistent with fibromuscular disease.  No evidence of a focal pseudo aneurysm or dissection.  Left common carotid artery is widely patent to the bifurcation region.  There is calcific atherosclerosis but no stenosis.  The proximal internal carotid artery is widely patent.  The cervical internal carotid artery on this side similarly shows tortuosity and mild dilatation consistent with fibromuscular disease.  No focal aneurysm or dissection.  Both vertebral artery origins are widely patent.  Both vertebral arteries are patent through the neck.  No evidence of stenosis or dissection.  No soft tissue lesion of the neck is evident.   Review of the MIP images confirms the above findings.  IMPRESSION: No acute vascular pathology.  Nonstenotic atherosclerosis in both carotid bifurcation regions but no  narrowing of either internal carotid artery.  Tortuosity of both cervical internal carotid arteries with mild dilatation and irregularity consistent with fibromuscular disease. On neither side is there evidence of a frank pseudoaneurysm, any dissection or  any flow limiting stenosis.  Therefore, this is likely longstanding and incidental.   Original Report Authenticated By: Paulina Fusi, M.D.    Ct Cervical Spine Wo Contrast  04/02/2012  *RADIOLOGY REPORT*  Clinical Data:  Neck pain.  Weakness.  Headache.  CT HEAD WITHOUT CONTRAST CT CERVICAL SPINE WITHOUT CONTRAST  Technique:  Multidetector CT imaging of the head and cervical spine was performed following the standard protocol without intravenous contrast.  Multiplanar CT image reconstructions of the cervical spine were also generated.  Comparison:  Radiography 03/20/2012.  CT HEAD  Findings: The brain shows generalized atrophy.  There is extensive chronic appearing low density throughout the cerebral hemispheric white matter consistent with chronic small vessel disease.  There is chronic atrophy at the right temporal tip.  No evidence of acute infarction, mass lesion, hemorrhage, hydrocephalus or extra-axial collection.  Visualized sinuses, middle ears and mastoids are clear.  IMPRESSION: No identifiably acute brain abnormality.  Atrophy and extensive chronic appearing small vessel change throughout the brain.  Old focal atrophy/gliosis in the right temporal tip.  CT CERVICAL SPINE  Findings: Alignment is normal.  No evidence of fracture.  There is degenerative arthritis at the articulation of the anterior arch of C1 and the dens.  There is facet arthropathy on the right at C3-4 and T1-2 and on the left at C2-3, C3-4 and C7-T1.  There is degenerative spondylosis throughout the cervical region with disc space narrowing and marginal osteophytes at C2-3, C3-4, C4-5, C5-6 and C6-7.  The canal is narrowed throughout that region but there is no definable cord compression.  There is foraminal encroachment through the region by osteophytes that could be a cause of focal neural compression.  IMPRESSION: Extensive chronic degenerative changes throughout the cervical spine.  No identifiably acute or traumatic finding.  No  critical stenosis of the central canal is discernible with CT.   Original Report Authenticated By: Paulina Fusi, M.D.    Mr Laqueta Jean ZO Contrast  04/02/2012  *RADIOLOGY REPORT*  Clinical Data: Neck pain.  Breast cancer.  Hypertension.  MRI HEAD WITHOUT AND WITH CONTRAST  Technique:  Multiplanar, multiecho pulse sequences of the brain and surrounding structures were obtained according to standard protocol without and with intravenous contrast  Contrast: 13mL MULTIHANCE GADOBENATE DIMEGLUMINE 529 MG/ML IV SOLN  Comparison: 11/25/2007 brain MR.  CT angiogram of the neck 04/02/2012.  Findings: No acute infarct.  No intracranial hemorrhage.  Prominent small vessel disease type changes.  Remote infarcts anterior right temporal lobe with encephalomalacia.  Global atrophy without hydrocephalus.  No intracranial enhancing lesion.  Major intracranial vascular structures are patent.  Minimal paranasal sinus mucosal thickening.  Transverse ligament hypertrophy.  Cervical spondylotic changes. Question if this may contribute to the patient's symptoms. Cervical spine MR can be obtained for further delineation if clinically desired.  IMPRESSION: No acute infarct.  Prominent small vessel disease type changes.  Remote infarcts anterior right temporal lobe with encephalomalacia.  Global atrophy without hydrocephalus.  No intracranial enhancing lesion.  Transverse ligament hypertrophy.  Cervical spondylotic changes. Question if this may contribute to the patient's symptoms. Cervical spine MR can be obtained for further delineation if clinically desired.   Original Report Authenticated By: Lacy Duverney, M.D.          Subjective: Patient  complaint of back pain, but was not asking for her opioid medications. She denied any fevers, chills, chest pain, shortness breath, nausea, vomiting, diarrhea, abdominal pain, dysuria, hematuria.  Objective: Filed Vitals:   04/02/12 0900 04/02/12 0947 04/02/12 1205 04/02/12 2118  BP:   141/80 144/85 115/71  Pulse: 93 91 84 80  Temp:   98.5 F (36.9 C) 98.3 F (36.8 C)  TempSrc:   Oral Oral  Resp:  16 18 18   Height:      Weight:   65.545 kg (144 lb 8 oz)   SpO2: 95% 96% 97% 98%   No intake or output data in the 24 hours ending 04/03/12 0916 Weight change: 0.227 kg (8 oz) Exam:   General:  Pt is alert, follows commands appropriately, not in acute distress  HEENT: No icterus, No thrush,  [employer]/AT  Cardiovascular: RRR, S1/S2, no rubs, no gallops  Respiratory: CTA bilaterally, no wheezing, no crackles, no rhonchi  Abdomen: Soft/+BS, non tender, non distended, no guarding  Extremities: No edema, No lymphangitis, No petechiae, No rashes, no synovitis  Data Reviewed: Basic Metabolic Panel:  Recent Labs Lab 04/02/12 0536 04/03/12 0637  NA 137 137  K 4.3 4.2  CL 98 101  CO2 27 26  GLUCOSE 146* 100*  BUN 19 19  CREATININE 0.76 0.77  CALCIUM 9.2 8.7   Liver Function Tests: No results found for this basename: AST, ALT, ALKPHOS, BILITOT, PROT, ALBUMIN,  in the last 168 hours No results found for this basename: LIPASE, AMYLASE,  in the last 168 hours No results found for this basename: AMMONIA,  in the last 168 hours CBC:  Recent Labs Lab 04/02/12 0536  WBC 8.8  HGB 14.2  HCT 42.2  MCV 93.0  PLT 144*   Cardiac Enzymes:  Recent Labs Lab 04/02/12 1916 04/03/12 0025 04/03/12 0637  TROPONINI <0.30 <0.30 <0.30   BNP: No components found with this basename: POCBNP,  CBG: No results found for this basename: GLUCAP,  in the last 168 hours  No results found for this or any previous visit (from the past 240 hour(s)).   Scheduled Meds: . calcium carbonate  1 tablet Oral Q breakfast  . lisinopril  20 mg Oral Daily  . methocarbamol  750 mg Oral TID  . omega-3 acid ethyl esters  1 g Oral Daily  . senna  1 tablet Oral QHS  . warfarin  0.5 mg Oral Custom  . [START ON 04/04/2012] warfarin  1 mg Oral Custom  . Warfarin - Pharmacist Dosing Inpatient    Does not apply q1800   Continuous Infusions:    Revin Corker, DO  [facility] Pager [phone]  If 7PM-7AM, please contact night-coverage [email or web address] Password [facility] 04/03/2012, 9:16 AM   LOS: 1 day

## 2012-04-03 NOTE — Evaluation (Signed)
Occupational Therapy Evaluation Patient Details Name: Julie Mosley MRN: [id number] DOB: [birth year] Today's Date: 04/03/2012 Time: 4098-1191    OT Assessment / Plan / Recommendation Clinical Impression  Pt presents to OT s/p admission to hospital with neck pain and dizziness. Pt with decreased I with ADL activity at this time and will benefit from skilled OT to increase I with ADL activity and return to [facility]    OT Assessment  Patient needs continued OT Services    Follow Up Recommendations  Home health OT;SNF;Other (comment) (depending on progress)       Equipment Recommendations  None recommended by OT       Frequency  Min 2X/week    Precautions / Restrictions Precautions Precautions: Fall Precaution Comments: dizzy upon sitting Restrictions Weight Bearing Restrictions: No       ADL  Grooming: Simulated;Wash/dry face;Set up Where Assessed - Grooming: Unsupported sitting Upper Body Bathing: Simulated;Minimal assistance Where Assessed - Upper Body Bathing: Unsupported sitting Lower Body Bathing: Performed;Moderate assistance Where Assessed - Lower Body Bathing: Supported sit to stand Upper Body Dressing: Simulated;Minimal assistance Where Assessed - Upper Body Dressing: Unsupported sitting Lower Body Dressing: Performed;Moderate assistance Where Assessed - Lower Body Dressing: Supported sit to Pharmacist, hospital: Performed;Minimal Dentist Method: Sit to stand Toileting - Architect and Hygiene: Simulated;Minimal assistance Where Assessed - Toileting Clothing Manipulation and Hygiene: Standing Transfers/Ambulation Related to ADLs: Pt did report dizziness upon sitting. No nystagmus noted.  Share with RN    OT Diagnosis: Generalized weakness;Acute pain  OT Problem List: Decreased strength;Decreased activity tolerance OT Treatment Interventions: Self-care/ADL training;Patient/family education;DME and/or AE instruction   OT Goals Acute  Rehab OT Goals OT Goal Formulation: With patient Time For Goal Achievement: 04/17/12 Potential to Achieve Goals: Good ADL Goals Pt Will Perform Grooming: with modified independence;Standing at sink ADL Goal: Grooming - Progress: Goal set today Pt Will Perform Upper Body Dressing: with modified independence;Sit to stand from bed ADL Goal: Upper Body Dressing - Progress: Goal set today Pt Will Perform Lower Body Dressing: Sit to stand from bed;with modified independence ADL Goal: Lower Body Dressing - Progress: Goal set today Pt Will Transfer to Toilet: with modified independence;Comfort height toilet ADL Goal: Toilet Transfer - Progress: Goal set today Pt Will Perform Toileting - Clothing Manipulation: with modified independence;Standing ADL Goal: Toileting - Clothing Manipulation - Progress: Goal set today  Visit Information  Last OT Received On: 04/03/12    Subjective Data  Subjective: I can do everything for my self- but maybe not today   Prior Functioning     Home Living Lives With: Alone Available Help at Discharge: Family Type of Home: House Home Access: Stairs to enter Secretary/administrator of Steps: 5 Entrance Stairs-Rails: Left Home Layout: One level Bathroom Shower/Tub: Engineer, manufacturing systems: Handicapped height Home Adaptive Equipment: Grab bars in shower;Walker - rolling Prior Function Level of Independence: Independent Able to Take Stairs?: Yes Driving: Yes Vocation: Retired Musician: No difficulties         Vision/Perception Vision - History Patient Visual Report: No change from baseline   Cognition  Cognition Overall Cognitive Status: Appears within functional limits for tasks assessed/performed Arousal/Alertness: Awake/alert Orientation Level: Appears intact for tasks assessed Behavior During Session: [facility] for tasks performed    Extremity/Trunk Assessment Right Upper Extremity Assessment RUE ROM/Strength/Tone: [facility]  for tasks assessed Left Upper Extremity Assessment LUE ROM/Strength/Tone: [facility] for tasks assessed     Mobility Bed Mobility Bed Mobility: Supine to Sit Supine to  Sit: 4: Min guard;HOB flat;With rails Transfers Transfers: Sit to Stand;Stand to Sit Sit to Stand: 4: Min assist;With upper extremity assist;From bed Stand to Sit: 4: Min assist;With upper extremity assist;To chair/3-in-1 Details for Transfer Assistance: Pt did report dizziness upon sitting. BP 126/78           End of Session OT - End of Session Activity Tolerance: Patient tolerated treatment well Patient left: in chair;with call bell/phone within reach  GO     [facility], [place] 04/03/2012, 11:56 AM

## 2012-04-03 NOTE — Evaluation (Signed)
Physical Therapy Evaluation Patient Details Name: Julie Mosley MRN: [id number] DOB: [birth year] Today's Date: 04/03/2012 Time: 4098-1191 PT Time Calculation (min): 20 min  PT Assessment / Plan / Recommendation Clinical Impression  Pt. admitted 04/02/12 for intractable neck pain. R/O vascular pathology. Pt. has h/o tisa, cancer. Pt. was independent/high functioning PTA. Pt. will benefit from PT while in acute care. Pt. will benefit from SNF prior to return home.    PT Assessment  Patient needs continued PT services    Follow Up Recommendations  SNF    Does the patient have the potential to tolerate intense rehabilitation      Barriers to Discharge Decreased caregiver support      Equipment Recommendations  None recommended by PT    Recommendations for Other Services     Frequency Min 3X/week    Precautions / Restrictions Precautions Precautions: Fall Precaution Comments: dizzy upon sitting   Pertinent Vitals/Pain HR 117      Mobility  Bed Mobility Bed Mobility: Sit to Supine Supine to Sit: 4: Min guard;HOB flat;With rails Sit to Supine: 5: Supervision;HOB flat Transfers Sit to Stand: 4: Min assist;From chair/3-in-1;With armrests;With upper extremity assist Stand to Sit: To bed;To chair/3-in-1;With armrests;With upper extremity assist Details for Transfer Assistance: Pt did report dizziness upon sitting. BP 126/78 Ambulation/Gait Ambulation/Gait Assistance: 4: Min assist Ambulation Distance (Feet): 100 Feet Assistive device: Rolling walker Ambulation/Gait Assistance Details: cues on rolling walker vs. pick up. Gait Pattern: Step-through pattern General Gait Details: decreased ability to rotate head to each side.    Exercises     PT Diagnosis: Difficulty walking;Generalized weakness;Acute pain  PT Problem List: Decreased strength;Decreased activity tolerance;Decreased mobility;Pain;Decreased knowledge of use of DME;Decreased safety awareness;Cardiopulmonary status  limiting activity PT Treatment Interventions: DME instruction;Gait training;Functional mobility training;Therapeutic activities;Therapeutic exercise;Patient/family education   PT Goals Acute Rehab PT Goals PT Goal Formulation: With patient/family Time For Goal Achievement: 04/17/12 Potential to Achieve Goals: Good Pt will go Sit to Supine/Side: Independently PT Goal: Sit to Supine/Side - Progress: Goal set today Pt will go Sit to Stand: with supervision PT Goal: Sit to Stand - Progress: Goal set today Pt will go Stand to Sit: with supervision PT Goal: Stand to Sit - Progress: Goal set today Pt will Ambulate: 51 - 150 feet;with supervision;with rolling walker PT Goal: Ambulate - Progress: Goal set today Pt will Perform Home Exercise Program: with supervision, verbal cues required/provided PT Goal: Perform Home Exercise Program - Progress: Goal set today  Visit Information  Last PT Received On: 04/03/12 Assistance Needed: +1    Subjective Data  Subjective: I don't think I can take of my self. Patient Stated Goal:  be independent   Prior Functioning  Home Living Lives With: Alone Available Help at Discharge: Family Type of Home: House Home Access: Stairs to enter Secretary/administrator of Steps: 5 Entrance Stairs-Rails: Left Home Layout: One level Bathroom Shower/Tub: Engineer, manufacturing systems: Handicapped height Home Adaptive Equipment: Grab bars in shower;Walker - rolling Prior Function Level of Independence: Independent Able to Take Stairs?: Yes Driving: Yes Vocation: Retired Musician: No difficulties    Copywriter, advertising Overall Cognitive Status: Appears within functional limits for tasks assessed/performed Arousal/Alertness: Awake/alert Orientation Level: Appears intact for tasks assessed Behavior During Session: [facility] for tasks performed    Extremity/Trunk Assessment Right Upper Extremity Assessment RUE ROM/Strength/Tone: [facility] for  tasks assessed Left Upper Extremity Assessment LUE ROM/Strength/Tone: [facility] for tasks assessed Right Lower Extremity Assessment RLE ROM/Strength/Tone: [facility] for tasks assessed RLE Sensation:  WFL - Light Touch Left Lower Extremity Assessment LLE ROM/Strength/Tone: WFL for tasks assessed LLE Sensation: WFL - Light Touch   Balance    End of Session PT - End of Session Activity Tolerance: Patient tolerated treatment well Patient left: with call bell/phone within reach;with family/visitor present Nurse Communication: Mobility status  GP     Rada Hay 04/03/2012, 3:08 PM [phone]

## 2012-04-04 DIAGNOSIS — R42 Dizziness and giddiness: Secondary | ICD-10-CM

## 2012-04-04 LAB — BASIC METABOLIC PANEL
CO2: 25 mEq/L (ref 19–32)
Calcium: 8.6 mg/dL (ref 8.4–10.5)
Creatinine, Ser: 0.68 mg/dL (ref 0.50–1.10)
Glucose, Bld: 114 mg/dL — ABNORMAL HIGH (ref 70–99)

## 2012-04-04 LAB — URINALYSIS, ROUTINE W REFLEX MICROSCOPIC
Hgb urine dipstick: NEGATIVE
Nitrite: NEGATIVE
Protein, ur: 30 mg/dL — AB
Urobilinogen, UA: 1 mg/dL (ref 0.0–1.0)

## 2012-04-04 LAB — PROTIME-INR: INR: 2.46 — ABNORMAL HIGH (ref 0.00–1.49)

## 2012-04-04 MED ORDER — LISINOPRIL 40 MG PO TABS
40.0000 mg | ORAL_TABLET | Freq: Every day | ORAL | Status: DC
  Administered 2012-04-04: 40 mg via ORAL
  Filled 2012-04-04: qty 1

## 2012-04-04 MED ORDER — POTASSIUM CHLORIDE CRYS ER 20 MEQ PO TBCR
40.0000 meq | EXTENDED_RELEASE_TABLET | Freq: Once | ORAL | Status: AC
  Administered 2012-04-04: 40 meq via ORAL
  Filled 2012-04-04: qty 2

## 2012-04-04 MED ORDER — METHOCARBAMOL 750 MG PO TABS
750.0000 mg | ORAL_TABLET | Freq: Once | ORAL | Status: AC
  Administered 2012-04-04: 750 mg via ORAL
  Filled 2012-04-04: qty 1

## 2012-04-04 MED ORDER — OXYCODONE-ACETAMINOPHEN 5-325 MG PO TABS: 1.0000 | ORAL_TABLET | ORAL | Status: DC | PRN

## 2012-04-04 MED ORDER — BISACODYL 5 MG PO TBEC: 10.0000 mg | DELAYED_RELEASE_TABLET | Freq: Every day | ORAL | Status: DC | PRN

## 2012-04-04 MED ORDER — LISINOPRIL 40 MG PO TABS: 40.0000 mg | ORAL_TABLET | Freq: Every day | ORAL | Status: DC

## 2012-04-04 MED ORDER — SENNA 8.6 MG PO TABS: 1.0000 | ORAL_TABLET | Freq: Every day | ORAL | Status: DC

## 2012-04-04 MED ORDER — METHOCARBAMOL 750 MG PO TABS: 750.0000 mg | ORAL_TABLET | Freq: Three times a day (TID) | ORAL | Status: DC | PRN

## 2012-04-04 NOTE — Discharge Summary (Signed)
Physician Discharge Summary  Julie Mosley ZOX:[id number] DOB: [birth year] DOA: 04/02/2012  PCP: Cain Saupe, MD  Admit date: 04/02/2012 Discharge date: 04/04/2012  Recommendations for Outpatient Follow-up:  1. Ongoing PT and OT at SNF 2. Repeat BP, BMP, INR in 1 weeks after blood pressure medication adjustments.  Discharge Diagnoses:  Active Problems:   Hypertension   Neck pain   H/O: CVA (cerebrovascular accident)   Nausea   Uncontrolled pain   Discharge Condition: stable, improved  Diet recommendation: healthy heart  Wt Readings from Last 3 Encounters:  04/02/12 65.545 kg (144 lb 8 oz)    History of present illness:   Julie Mosley is a 79 y.o. female with PMH as listed below as well as back pain followed by PCP who presents with the above complaints. It is noted she had plain films of back done on 2/25 which showed kyphosis, DJD and she was to follow up with her PCP. A few days ago she reports shovelling some snow and also reports that Over the weekend she slept off in the chair and when she woke up she felt like she had a 'crook' in her neck- was having some neck pain. Then when she woke up last PM she was unable to get out of bed and it took her about 2hrs to figure out how to call EMS. She admits to nausea but denies vomiting. It is noted that per EDP pt reported dizziness, but denies, repeately stating that she has had no dizzy spells. In the ED CT cervical spine showed Extensive chronic degenerative changes throughout the cervical spine. No identifiably acute or traumatic finding, and CT head was neg, and CTA neck showed No acute vascular pathology. She denies chest pain, prasthesias, focal weakness, dysphagia and no blurry vision.  In the EDP pt had an apneic episode after dilaudid, also noted she had valium in ED. She was' bagged' for a few secs and spontaneous breathing returned- did not loose her pulse. She is admitted for further eval and management  Hospital Course:    Uncontrolled pain due to muscle spasm of the neck.  Initially required intravenous opioids, however, was able to wean to oral narcotics and muscle relaxers.  She should continue stretching exercises of the neck with heating packs as needed to relieve the muscle tension.  Continue PT.  Neck pain status post mechanical fall (patent denies any falls).  CT brain without acute changes.  MRI brain showed old right temporal lobe infarcts, small vessel disease.  CT cervical spine revealed no fractures, extensive OA.  CT angiogram of the neck negative for any acute vascular pathology; No aneurysm or pseudoaneurysm.  Toponins neg, telemetry with rate controlled atrial fibrillation.    Chronic Atrial fibrillation with history of stroke Rate controlled on warfarin.    Hypertension.  Continue lisinopril and HCTZ held due to hypokalemia.  Lisinopril increased to 40mg .  Repeat BMP in 1-2 weeks.    Hypokalemia, likely due to HCTZ which was held.  Replaced with oral potassium.    Procedures:  CT head  CTa neck  CT neck  MRI brain  Consultations:  none  Discharge Exam: Filed Vitals:   04/04/12 0600  BP: 141/83  Pulse: 92  Temp: 98.3 F (36.8 C)  Resp: 18   Filed Vitals:   04/02/12 2118 04/03/12 1501 04/03/12 2200 04/04/12 0600  BP: 115/71 139/89 140/87 141/83  Pulse: 80 99 89 92  Temp: 98.3 F (36.8 C) 98.7 F (37.1 C) 98 F (  36.7 C) 98.3 F (36.8 C)  TempSrc: Oral Oral Oral Oral  Resp: 18 18 18 18   Height:      Weight:      SpO2: 98% 96% 91% 95%    General: Caucasian female, no acute distress, sitting up in chair HEENT:  Nose with some erythema across the nasal bridge with some eschar Cardiovascular: IRRR, 2/6 murmur at LSB, no rubs or gallops, 1+ pedal pulses, cool feet with normal cap refill Respiratory: CTAB ABD:  NABS, soft, ND/NT MSK:  No LEE Psych:  A&Ox4  Discharge Instructions      Discharge Orders   Future Orders Complete By Expires     Call MD for:   difficulty breathing, headache or visual disturbances  As directed     Call MD for:  extreme fatigue  As directed     Call MD for:  hives  As directed     Call MD for:  persistant dizziness or light-headedness  As directed     Call MD for:  persistant nausea and vomiting  As directed     Call MD for:  severe uncontrolled pain  As directed     Call MD for:  temperature >100.4  As directed     Diet - low sodium heart healthy  As directed     Discharge instructions  As directed     Comments:      You were hospitalized with a muscle spasm of the neck.  Your CT of the head and neck demonstrated some cervical spine arthritis which may be contributing to the pain.  Your MRI of the brain demonstrated some areas of previous strokes.  Please continue heat packs, physical therapy.  You may use percocet for pain and robaxin for muscle spasms.  Talk to your primary care doctor if your pain is not improving.    Increase activity slowly  As directed         Medication List    STOP taking these medications       hydrochlorothiazide 25 MG tablet  Commonly known as:  HYDRODIURIL      TAKE these medications       ALENDRONATE SODIUM PO  Take by mouth once a week.     bisacodyl 5 MG EC tablet  Commonly known as:  DULCOLAX  Take 2 tablets (10 mg total) by mouth daily as needed.     calcium carbonate 1250 MG tablet  Commonly known as:  OS-CAL - dosed in mg of elemental calcium  Take 1 tablet by mouth daily.     glucosamine-chondroitin 500-400 MG tablet  Take 1 tablet by mouth 3 (three) times daily.     lisinopril 40 MG tablet  Commonly known as:  PRINIVIL,ZESTRIL  Take 1 tablet (40 mg total) by mouth daily.     methocarbamol 750 MG tablet  Commonly known as:  ROBAXIN  Take 1 tablet (750 mg total) by mouth 3 (three) times daily as needed (muscle spasm of neck).     omega-3 acid ethyl esters 1 G capsule  Commonly known as:  LOVAZA  Take 1 g by mouth daily.     oxyCODONE-acetaminophen 5-325  MG per tablet  Commonly known as:  PERCOCET/ROXICET  Take 1-2 tablets by mouth every 4 (four) hours as needed.     senna 8.6 MG Tabs  Commonly known as:  SENOKOT  Take 1 tablet (8.6 mg total) by mouth at bedtime. To prevent constipation while on narcotics.  Hold  for diarrhea or > 3BM per day.     warfarin 1 MG tablet  Commonly known as:  COUMADIN  Take 1 mg by mouth as directed. MWF,Sa 1mg   T,TH,Su take .5mg        Follow-up Information   Follow up with FULP, CAMMIE, MD. Schedule an appointment as soon as possible for a visit in 1 week. (BMP and BP check, pain management)    Contact information:   [address] [place] [place] [address] [phone]        The results of significant diagnostics from this hospitalization (including imaging, microbiology, ancillary and laboratory) are listed below for reference.    Significant Diagnostic Studies: Dg Cervical Spine Complete  03/20/2012  *RADIOLOGY REPORT*  Clinical Data: Neck pain and right shoulder pain, no trauma  CERVICAL SPINE - COMPLETE 4+ VIEW  Comparison: None.  Findings: The cervical vertebrae are in normal alignment.  However there is significant degenerative joint disease diffusely throughout the cervical vertebrae with loss of disc space, sclerosis, and spurring.  No prevertebral soft tissue swelling is seen.  There is some foraminal narrowing at C3-4 and C6-7 level, with the remainder of the foramen being patent.  There is degenerative change diffusely throughout the facet joints.  The lung apices are clear, and the odontoid process is intact.  IMPRESSION: Diffuse degenerative disc disease.  Normal alignment.  Mild foraminal narrowing at C3-4 and C6-7   Original Report Authenticated By: Dwyane Dee, M.D.    Dg Thoracic Spine W/swimmers  03/20/2012  *RADIOLOGY REPORT*  Clinical Data: Upper back pain, no trauma  THORACIC SPINE - 2 VIEW + SWIMMERS  Comparison: Chest x-ray of 11/25/2007  Findings: There is a slight  thoracic kyphosis present with mild degenerative change in the mid to lower thoracic spine.  There is some loss of disc spaces diffusely with mild anterior osteophyte formation and sclerosis.  However, no compression deformity is seen.  IMPRESSION: Mild thoracic kyphosis with diffuse degenerative change.  No acute compression deformity.   Original Report Authenticated By: Dwyane Dee, M.D.    Ct Head Wo Contrast  04/02/2012  *RADIOLOGY REPORT*  Clinical Data:  Neck pain.  Weakness.  Headache.  CT HEAD WITHOUT CONTRAST CT CERVICAL SPINE WITHOUT CONTRAST  Technique:  Multidetector CT imaging of the head and cervical spine was performed following the standard protocol without intravenous contrast.  Multiplanar CT image reconstructions of the cervical spine were also generated.  Comparison:  Radiography 03/20/2012.  CT HEAD  Findings: The brain shows generalized atrophy.  There is extensive chronic appearing low density throughout the cerebral hemispheric white matter consistent with chronic small vessel disease.  There is chronic atrophy at the right temporal tip.  No evidence of acute infarction, mass lesion, hemorrhage, hydrocephalus or extra-axial collection.  Visualized sinuses, middle ears and mastoids are clear.  IMPRESSION: No identifiably acute brain abnormality.  Atrophy and extensive chronic appearing small vessel change throughout the brain.  Old focal atrophy/gliosis in the right temporal tip.  CT CERVICAL SPINE  Findings: Alignment is normal.  No evidence of fracture.  There is degenerative arthritis at the articulation of the anterior arch of C1 and the dens.  There is facet arthropathy on the right at C3-4 and T1-2 and on the left at C2-3, C3-4 and C7-T1.  There is degenerative spondylosis throughout the cervical region with disc space narrowing and marginal osteophytes at C2-3, C3-4, C4-5, C5-6 and C6-7.  The canal is narrowed throughout that region but there is  no definable cord compression.  There  is foraminal encroachment through the region by osteophytes that could be a cause of focal neural compression.  IMPRESSION: Extensive chronic degenerative changes throughout the cervical spine.  No identifiably acute or traumatic finding.  No critical stenosis of the central canal is discernible with CT.   Original Report Authenticated By: Paulina Fusi, M.D.    Ct Angio Neck W/cm &/or Wo/cm  04/02/2012  *RADIOLOGY REPORT*  Clinical Data:  Severe neck pain.  Assess for vascular injury.  CT ANGIOGRAPHY NECK  Technique:  Multidetector CT imaging of the neck was performed using the standard protocol during bolus administration of intravenous contrast.  Multiplanar CT image reconstructions including MIPs were obtained to evaluate the vascular anatomy. Carotid stenosis measurements (when applicable) are obtained utilizing NASCET criteria, using the distal internal carotid diameter as the denominator.  Contrast: OMNIPAQUE IOHEXOL 350 MG/ML SOLN  Comparison:   None.  Findings:  Lung apices show pleural and parenchymal scarring with some pleural calcification.  There is extensive chronic atherosclerosis of the aorta with wall calcification.  No focal aneurysm or dissection.  Branching pattern of the brachiocephalic vessels from the arch is normal.  There is atherosclerotic disease at the brachiocephalic vessel origins but no stenosis.  Right common carotid artery is widely patent to to the distal common carotid just proximal to the bifurcation.  There is atherosclerotic calcification at that location but luminal narrowing only measuring 20%.  The carotid bifurcation itself is widely patent with no stenosis of the ICA.  The cervical ICA is tortuous and shows mild dilatation and irregularity at the level of C1 and C2 consistent with fibromuscular disease.  No evidence of a focal pseudo aneurysm or dissection.  Left common carotid artery is widely patent to the bifurcation region.  There is calcific atherosclerosis but  no stenosis.  The proximal internal carotid artery is widely patent.  The cervical internal carotid artery on this side similarly shows tortuosity and mild dilatation consistent with fibromuscular disease.  No focal aneurysm or dissection.  Both vertebral artery origins are widely patent.  Both vertebral arteries are patent through the neck.  No evidence of stenosis or dissection.  No soft tissue lesion of the neck is evident.   Review of the MIP images confirms the above findings.  IMPRESSION: No acute vascular pathology.  Nonstenotic atherosclerosis in both carotid bifurcation regions but no narrowing of either internal carotid artery.  Tortuosity of both cervical internal carotid arteries with mild dilatation and irregularity consistent with fibromuscular disease. On neither side is there evidence of a frank pseudoaneurysm, any dissection or any flow limiting stenosis.  Therefore, this is likely longstanding and incidental.   Original Report Authenticated By: Paulina Fusi, M.D.    Ct Cervical Spine Wo Contrast  04/02/2012  *RADIOLOGY REPORT*  Clinical Data:  Neck pain.  Weakness.  Headache.  CT HEAD WITHOUT CONTRAST CT CERVICAL SPINE WITHOUT CONTRAST  Technique:  Multidetector CT imaging of the head and cervical spine was performed following the standard protocol without intravenous contrast.  Multiplanar CT image reconstructions of the cervical spine were also generated.  Comparison:  Radiography 03/20/2012.  CT HEAD  Findings: The brain shows generalized atrophy.  There is extensive chronic appearing low density throughout the cerebral hemispheric white matter consistent with chronic small vessel disease.  There is chronic atrophy at the right temporal tip.  No evidence of acute infarction, mass lesion, hemorrhage, hydrocephalus or extra-axial collection.  Visualized sinuses, middle ears and mastoids  are clear.  IMPRESSION: No identifiably acute brain abnormality.  Atrophy and extensive chronic appearing  small vessel change throughout the brain.  Old focal atrophy/gliosis in the right temporal tip.  CT CERVICAL SPINE  Findings: Alignment is normal.  No evidence of fracture.  There is degenerative arthritis at the articulation of the anterior arch of C1 and the dens.  There is facet arthropathy on the right at C3-4 and T1-2 and on the left at C2-3, C3-4 and C7-T1.  There is degenerative spondylosis throughout the cervical region with disc space narrowing and marginal osteophytes at C2-3, C3-4, C4-5, C5-6 and C6-7.  The canal is narrowed throughout that region but there is no definable cord compression.  There is foraminal encroachment through the region by osteophytes that could be a cause of focal neural compression.  IMPRESSION: Extensive chronic degenerative changes throughout the cervical spine.  No identifiably acute or traumatic finding.  No critical stenosis of the central canal is discernible with CT.   Original Report Authenticated By: Paulina Fusi, M.D.    Mr Laqueta Jean ZO Contrast  04/02/2012  *RADIOLOGY REPORT*  Clinical Data: Neck pain.  Breast cancer.  Hypertension.  MRI HEAD WITHOUT AND WITH CONTRAST  Technique:  Multiplanar, multiecho pulse sequences of the brain and surrounding structures were obtained according to standard protocol without and with intravenous contrast  Contrast: 13mL MULTIHANCE GADOBENATE DIMEGLUMINE 529 MG/ML IV SOLN  Comparison: 11/25/2007 brain MR.  CT angiogram of the neck 04/02/2012.  Findings: No acute infarct.  No intracranial hemorrhage.  Prominent small vessel disease type changes.  Remote infarcts anterior right temporal lobe with encephalomalacia.  Global atrophy without hydrocephalus.  No intracranial enhancing lesion.  Major intracranial vascular structures are patent.  Minimal paranasal sinus mucosal thickening.  Transverse ligament hypertrophy.  Cervical spondylotic changes. Question if this may contribute to the patient's symptoms. Cervical spine MR can be obtained  for further delineation if clinically desired.  IMPRESSION: No acute infarct.  Prominent small vessel disease type changes.  Remote infarcts anterior right temporal lobe with encephalomalacia.  Global atrophy without hydrocephalus.  No intracranial enhancing lesion.  Transverse ligament hypertrophy.  Cervical spondylotic changes. Question if this may contribute to the patient's symptoms. Cervical spine MR can be obtained for further delineation if clinically desired.   Original Report Authenticated By: Lacy Duverney, M.D.     Microbiology: No results found for this or any previous visit (from the past 240 hour(s)).   Labs: Basic Metabolic Panel:  Recent Labs Lab 04/02/12 0536 04/03/12 0637 04/04/12 0525  NA 137 137 136  K 4.3 4.2 3.2*  CL 98 101 100  CO2 27 26 25   GLUCOSE 146* 100* 114*  BUN 19 19 24*  CREATININE 0.76 0.77 0.68  CALCIUM 9.2 8.7 8.6   Liver Function Tests: No results found for this basename: AST, ALT, ALKPHOS, BILITOT, PROT, ALBUMIN,  in the last 168 hours No results found for this basename: LIPASE, AMYLASE,  in the last 168 hours No results found for this basename: AMMONIA,  in the last 168 hours CBC:  Recent Labs Lab 04/02/12 0536  WBC 8.8  HGB 14.2  HCT 42.2  MCV 93.0  PLT 144*   Cardiac Enzymes:  Recent Labs Lab 04/02/12 1916 04/03/12 0025 04/03/12 0637  TROPONINI <0.30 <0.30 <0.30   BNP: BNP (last 3 results) No results found for this basename: PROBNP,  in the last 8760 hours CBG: No results found for this basename: GLUCAP,  in the last 168  hours  Time coordinating discharge: 45 minutes  Signed:  Louis Gaw  [facility] 04/04/2012, 8:57 AM

## 2012-04-04 NOTE — Progress Notes (Signed)
Patient cleared for discharge. Requested [facility]. Packet copied and placed in [place]. Family to transport. Bethany C. Corcoran MSW, LCSW [phone]

## 2012-04-04 NOTE — Progress Notes (Signed)
[facility]. Report called and given to nurse Shaquenia. All question answered accordingly. Pt left unit in wheelchair pushed by nurse tech accompanied by sons. Discharge packet given to son to take to nursing home. Left unit in good condition. Son is transporting pt to nursing home. SW made aware and she said she called to cancel ambulance. Vwilliams,rn.

## 2012-04-05 LAB — URINE CULTURE

## 2012-04-11 ENCOUNTER — Non-Acute Institutional Stay (SKILLED_NURSING_FACILITY): Payer: Medicare Other | Admitting: Internal Medicine

## 2012-04-11 DIAGNOSIS — I10 Essential (primary) hypertension: Secondary | ICD-10-CM

## 2012-04-11 DIAGNOSIS — Z8673 Personal history of transient ischemic attack (TIA), and cerebral infarction without residual deficits: Secondary | ICD-10-CM

## 2012-04-11 DIAGNOSIS — I4891 Unspecified atrial fibrillation: Secondary | ICD-10-CM

## 2012-04-11 DIAGNOSIS — M810 Age-related osteoporosis without current pathological fracture: Secondary | ICD-10-CM

## 2012-04-11 DIAGNOSIS — M542 Cervicalgia: Secondary | ICD-10-CM

## 2012-04-11 DIAGNOSIS — E785 Hyperlipidemia, unspecified: Secondary | ICD-10-CM

## 2012-04-14 ENCOUNTER — Encounter: Payer: Self-pay | Admitting: Internal Medicine

## 2012-04-14 DIAGNOSIS — I4891 Unspecified atrial fibrillation: Secondary | ICD-10-CM | POA: Insufficient documentation

## 2012-04-14 DIAGNOSIS — M810 Age-related osteoporosis without current pathological fracture: Secondary | ICD-10-CM | POA: Insufficient documentation

## 2012-04-14 DIAGNOSIS — Z8673 Personal history of transient ischemic attack (TIA), and cerebral infarction without residual deficits: Secondary | ICD-10-CM | POA: Insufficient documentation

## 2012-04-14 DIAGNOSIS — E785 Hyperlipidemia, unspecified: Secondary | ICD-10-CM | POA: Insufficient documentation

## 2012-04-14 DIAGNOSIS — I10 Essential (primary) hypertension: Secondary | ICD-10-CM | POA: Insufficient documentation

## 2012-04-14 NOTE — Progress Notes (Signed)
Date: 04/14/2012  MRN:  [id number] Name:  Julie Mosley Sex:  female Age:  79 y.o. DOB:[birth year]                 Facility/Room:  [facility] Level Of Care:  SNF Provider:  Bufford Spikes, DO, CMD  Emergency Contacts: Contact Information   Name Relation Home Work Roscoe Son [phone]     Saidi, Santacroce   [phone]      Code Status:  DNR, this was discussed and reviewed with pt today whose wishes are clear  Allergies: Allergies  Allergen Reactions  . Valium (Diazepam) Other (See Comments)    Stopped breathing     Chief Complaint  Patient presents with  . Neck Pain    new admission for STR s/p hospitalization with neck pain      HPI: 79 yo female with h/o unknown type of cancer in the distant past, HTN, cognitive impairment, prior TIA and stroke was admitted here for rehab s/p fall with neck pain. She was hospitalized for this, and workup did not reveal any concerning findings in her neck.  She is here for PT, OT, and therapeutic modalities to treat muscle spasms in her neck and to ensure her safety to return home.     Past Medical History  Diagnosis Date  . TIA (transient ischemic attack)   . Hypertension   . Cancer     Past Surgical History  Procedure Laterality Date  . Hip surgery    . Cesarean section    . Breast lumpectomy      bilateral  . Skin biopsy      From left foot.     Procedures: CT cspine 03/20/12: Diffuse degenerative disc disease. Normal alignment. Mild  foraminal narrowing at C3-4 and C6-7  THORACIC SPINE - 2 VIEW + SWIMMERS 03/20/12:  Mild thoracic kyphosis with diffuse degenerative change. No acute compression deformity.  CT brain 04/02/12:  No identifiably acute brain abnormality. Atrophy and extensive  chronic appearing small vessel change throughout the brain. Old  focal atrophy/gliosis in the right temporal tip.   CT cspine 04/02/12:  Extensive chronic degenerative changes throughout the cervical  spine. No  identifiably acute or traumatic finding. No critical  stenosis of the central canal is discernible with CT.  CT angio neck 04/02/12:  No acute vascular pathology. Nonstenotic atherosclerosis in both  carotid bifurcation regions but no narrowing of either internal  carotid artery.  Tortuosity of both cervical internal carotid arteries with mild  dilatation and irregularity consistent with fibromuscular disease.  On neither side is there evidence of a frank pseudoaneurysm, any  dissection or any flow limiting stenosis. Therefore, this is  likely longstanding and incidental.  MRI head 04/02/12: No acute infarct.  Prominent small vessel disease type changes.  Remote infarcts anterior right temporal lobe with encephalomalacia.  Global atrophy without hydrocephalus.  No intracranial enhancing lesion.  Transverse ligament hypertrophy. Cervical spondylotic changes.  Question if this may contribute to the patient's symptoms.  Cervical spine MR can be obtained for further delineation if  clinically desired.   Current Outpatient Prescriptions  Medication Sig Dispense Refill  . ALENDRONATE SODIUM PO Take by mouth once a week.      . bisacodyl (DULCOLAX) 5 MG EC tablet Take 2 tablets (10 mg total) by mouth daily as needed.  30 tablet    . calcium carbonate (OS-CAL - DOSED IN MG OF ELEMENTAL CALCIUM) 1250 MG tablet Take 1 tablet by  mouth daily.      Marland Kitchen glucosamine-chondroitin 500-400 MG tablet Take 1 tablet by mouth 3 (three) times daily.      Marland Kitchen lisinopril (PRINIVIL,ZESTRIL) 40 MG tablet Take 1 tablet (40 mg total) by mouth daily.      . methocarbamol (ROBAXIN) 750 MG tablet Take 1 tablet (750 mg total) by mouth 3 (three) times daily as needed (muscle spasm of neck).      Marland Kitchen omega-3 acid ethyl esters (LOVAZA) 1 G capsule Take 1 g by mouth daily.      Marland Kitchen oxyCODONE-acetaminophen (PERCOCET/ROXICET) 5-325 MG per tablet Take 1-2 tablets by mouth every 4 (four) hours as needed.  30 tablet  0  . senna  (SENOKOT) 8.6 MG TABS Take 1 tablet (8.6 mg total) by mouth at bedtime. To prevent constipation while on narcotics.  Hold for diarrhea or > 3BM per day.  120 each    . warfarin (COUMADIN) 1 MG tablet Take 1 mg by mouth as directed. MWF,Sa 1mg  T,TH,Su take .5mg        No current facility-administered medications for this visit.   The facility is administering her meds as above.  They were reviewed during her visit.    There is no immunization history on file for this patient.   Diet:  Regular  History  Substance Use Topics  . Smoking status: Former Smoker    Quit date: 04/03/1975  . Smokeless tobacco: Never Used  . Alcohol Use: No   Previously was living at assisted living facility.  No family history on file.  Pt unable to provide.    ROS: General:  Denies fatigue, denies weight loss Skin:  Denies rashes, denies concerning changes HEENT:  Denies headaches, denies visual changes, denies hearing loss, denies nasal congestion, denies sore throat, denies difficulty swallowing CV:  Denies chest pain, denies dyspnea on exertion, denies orthopnea, denies PND, denies edema Pulm:  Denies shortness of breath, denies wheezing, denies cough GI:  Denies abdominal pain, denies constipation, denies diarrhea, denies melena, denies hematochezia, denies nausea, denies vomiting GU:  Denies urinary frequency, denies urinary urgency, denies dysuria, denies nocturia, denies urinary incontinence Neurologic:  Denies paresthesias, denies new focal weakness MSK:  Denies joint pain, denies back pain,complains of muscle pains in neck if she sidebends it, ambulates without assistive devices Hematology:  Denies bleeding,easy bruising with coumadin Immunology:  Denies recurrent infections Psychiatry:  Some dementia per staff, denies depression, denies anxiety   Vital signs reviewed in admission assessment General:  Thin, well-groomed pt in NAD, caucasian female Skin:  No lesions identified on  inspection HEENT:  NCAT, PERRLA, EOMI, no nasal drainage, pharynx without erythema or exudate Neck:  No palpable thyromegaly or mass, trachea midline Lymphatics:  No palpable cervical or supraclavicular nodes CV:  RRR, no M/G/R, +S1/S2 Pulm:  Lungs CTA, no r/r/w Abd:  Soft, nondistended, nontender, no rebound or guarding, no palpable hepatosplenomegaly GU:  Bladder nontender, nondistended, no CVA tenderness  MSK:  5/5 strength in all 4 extremities with normal ROM, gait steady w/o use of assistive device, earlier seen walking about facility;  No localized tenderness of her neck, notes pain during ROM testing with sidebending and rotation to left Neuro:  CNs II-XII grossly intact, no focal deficits, DTRs 2+, no tremors Psych:  AAOx2 (not to time), affect normal, pleasant, talkative   Hospitalizations: 04/02/12-04/04/12 neck pain  Problem List as of 04/11/2012     ICD-9-CM   Hypertension   TIA (transient ischemic attack)   Neck pain  H/O: CVA (cerebrovascular accident)   Nausea   Uncontrolled pain      Functional assessment: independent in bathing, dressing, grooming, feeding, needs help with meds, IADLs Areas of potential improvement:  Improve pain in neck  Rehabilitation Potential:  Good  Prognosis for survival:  [facility] were reviewed.  Assessment/Plan:  79 yo female admitted to SNF for rehab s/p hospitalization for neck pain 1.  Neck pain:  Due to muscle spasms from DDD and fall --will continue to work with PT, OT while here for therapeutic modalities and help to maintain physical strength and balance due to h/o fall --seems methocarbamol should be most helpful medication b/c pain seems to be due to muscle spasms --has percocet for severe pain and associated bowel regimen with senna and bisacodyl-would try to avoid using much of this and focus on more conservative therapies ([place], heat, massage, TENS) due to her cognitive impairment and fall risk 2.   History of CVA and  TIA in past:  Has some likely vascular dementia--based on affect, orientation, and also impressions from staff about memory --will need to see how she scores on BIMS during MDS assessment here at SNF 3.  Afib:  Chronic, on long term anticoagulation with coumadin--requires very low dose of 1mg  alternating with 0.5mg  --f/u INR next blood draw here at SNF  4.  Hyperlipidemia:  Cont fish oil 5. Osteoarthritis:  Cont glucosamine-chondroitin 6.  Senile Osteoporosis:  Cont fosamax each Tuesday and calcium with vitamin D, will need to f/u D level at routine visit 7.  Hypertension:  Goal in her case is <150/90 due to her age, risk of falls, cont to monitor, cont lisinopril 40mg , f/u bmp here

## 2012-04-18 ENCOUNTER — Non-Acute Institutional Stay (SKILLED_NURSING_FACILITY): Payer: Medicare Other | Admitting: Internal Medicine

## 2012-04-18 DIAGNOSIS — M542 Cervicalgia: Secondary | ICD-10-CM

## 2012-04-18 DIAGNOSIS — R52 Pain, unspecified: Secondary | ICD-10-CM

## 2012-04-18 DIAGNOSIS — M810 Age-related osteoporosis without current pathological fracture: Secondary | ICD-10-CM

## 2012-04-18 DIAGNOSIS — I4891 Unspecified atrial fibrillation: Secondary | ICD-10-CM

## 2012-04-18 DIAGNOSIS — I10 Essential (primary) hypertension: Secondary | ICD-10-CM

## 2012-04-18 DIAGNOSIS — E785 Hyperlipidemia, unspecified: Secondary | ICD-10-CM

## 2012-04-18 NOTE — Assessment & Plan Note (Signed)
Due to muscle spasm in neck.  Has improved.  Also has arthritis pain in fingers and knees.  Remains on methocarbamol and percocet as needed.

## 2012-04-18 NOTE — Assessment & Plan Note (Signed)
Has been at goal here at facility.

## 2012-04-18 NOTE — Assessment & Plan Note (Signed)
Continues on 2mg  coumadin po daily.  F/u INR in 1 week by home health RN.

## 2012-04-18 NOTE — Assessment & Plan Note (Signed)
Continues on fish oil.  Requires outpatient f/u.

## 2012-04-18 NOTE — Assessment & Plan Note (Addendum)
Has improved here with therapy and therapeutic modalities.  Cont pain medicines on an as needed basis, especially muscle relaxant, but watch for falls.  Limited supply was given.  D/c home with continued home health PT, OT, and RN to monitor coumadin until she is seen by her primary care physician.

## 2012-04-18 NOTE — Progress Notes (Signed)
Date: 04/18/2012  MRN:  [id number] Name:  Julie Mosley Sex:  female Age:  79 y.o. DOB:04/27/1926                 Facility/Room:  [facility]  Level Of Care:  SNF Provider:   Kermit Balo, DO, CMD  Emergency Contacts: Contact Information   Name Relation Home Work Hibernia Son [phone]     Finlay, [place]   [phone]      Code Status: DNR  Allergies: Allergies  Allergen Reactions  . Valium (Diazepam) Other (See Comments)    Stopped breathing     Chief Complaint  Patient presents with  . Neck Pain    discharge home after STR for neck pain     HPI:  Past Medical History  Diagnosis Date  . TIA (transient ischemic attack)   . Hypertension   . Cancer     Past Surgical History  Procedure Laterality Date  . Hip surgery    . Cesarean section    . Breast lumpectomy      bilateral  . Skin biopsy      From left foot.     Current Outpatient Prescriptions  Medication Sig Dispense Refill  . ALENDRONATE SODIUM PO Take by mouth once a week.      . bisacodyl (DULCOLAX) 5 MG EC tablet Take 2 tablets (10 mg total) by mouth daily as needed.  30 tablet    . calcium carbonate (OS-CAL - DOSED IN MG OF ELEMENTAL CALCIUM) 1250 MG tablet Take 1 tablet by mouth daily.      Marland Kitchen glucosamine-chondroitin 500-400 MG tablet Take 1 tablet by mouth 3 (three) times daily.      Marland Kitchen lisinopril (PRINIVIL,ZESTRIL) 40 MG tablet Take 1 tablet (40 mg total) by mouth daily.      . methocarbamol (ROBAXIN) 750 MG tablet Take 1 tablet (750 mg total) by mouth 3 (three) times daily as needed (muscle spasm of neck).      Marland Kitchen omega-3 acid ethyl esters (LOVAZA) 1 G capsule Take 1 g by mouth daily.      Marland Kitchen oxyCODONE-acetaminophen (PERCOCET/ROXICET) 5-325 MG per tablet Take 1-2 tablets by mouth every 4 (four) hours as needed.  30 tablet  0  . senna (SENOKOT) 8.6 MG TABS Take 1 tablet (8.6 mg total) by mouth at bedtime. To prevent constipation while on narcotics.  Hold for diarrhea or >  3BM per day.  120 each    . warfarin (COUMADIN) 1 MG tablet Take 1 mg by mouth as directed. MWF,Sa 1mg  T,TH,Su take .5mg        No current facility-administered medications for this visit.     There is no immunization history on file for this patient.  History  Substance Use Topics  . Smoking status: Former Smoker    Quit date: 04/03/1975  . Smokeless tobacco: Never Used  . Alcohol Use: No    No family history on file.   Review of Systems  Constitutional: Negative for fever, chills, weight loss and malaise/fatigue.  HENT: Positive for neck pain and ear discharge.        Neck ache has improved with therapy  Eyes: Negative for blurred vision.  Respiratory: Negative for cough and shortness of breath.   Cardiovascular: Negative for chest pain, palpitations and leg swelling.  Gastrointestinal: Negative for abdominal pain and constipation.  Genitourinary: Negative for dysuria, urgency and frequency.  Musculoskeletal: Positive for myalgias and joint pain.  Skin: Negative  for rash.  Neurological: Negative for dizziness, weakness and headaches.  Endo/Heme/Allergies: Does not bruise/bleed easily.  Psychiatric/Behavioral: Positive for memory loss. Negative for depression. The patient is not nervous/anxious.      Vital signs: There were no vitals taken for this visit. Physical Exam  Nursing note and vitals reviewed. Constitutional: She appears well-developed and well-nourished.  Thin Caucasian female in NAD, resting comfortably in bed  HENT:  Head: Normocephalic and atraumatic.  Nose: Nose normal.  Mouth/Throat: Oropharynx is clear and moist.  Eyes: Conjunctivae and EOM are normal. Pupils are equal, round, and reactive to light.  Neck: Normal range of motion. Neck supple. No JVD present. No tracheal deviation present.  Cardiovascular: Normal rate, regular rhythm, normal heart sounds and intact distal pulses.   Pulmonary/Chest: Effort normal and breath sounds normal.  Abdominal:  Soft. Bowel sounds are normal.  Neurological: She is alert. She has normal reflexes.  Oriented x 2  Skin: Skin is warm and dry.  Psychiatric: She has a normal mood and affect.  Poor short term memory, very pleasant      Problem List as of 04/18/2012     ICD-9-CM   Hypertension   A-fib   TIA (transient ischemic attack)   Neck pain   H/O: CVA (cerebrovascular accident)   Nausea   Uncontrolled pain   History of stroke   Essential hypertension, benign   Other and unspecified hyperlipidemia   Senile osteoporosis     Plan: A-fib Continues on 2mg  coumadin po daily.  F/u INR in 1 week by home health RN.    Essential hypertension, benign Has been at goal here at facility.    Senile osteoporosis Continue calcium with D and weekly alendronate at discharge.  Uncontrolled pain Due to muscle spasm in neck.  Has improved.  Also has arthritis pain in fingers and knees.  Remains on methocarbamol and percocet as needed.    Other and unspecified hyperlipidemia Continues on fish oil.  Requires outpatient f/u.  Neck pain Has improved here with therapy and therapeutic modalities.  Cont pain medicines on an as needed basis, especially muscle relaxant, but watch for falls.  Limited supply was given.  D/c home with continued home health PT, OT, and RN to monitor coumadin until she is seen by her primary care physician.

## 2012-04-18 NOTE — Assessment & Plan Note (Signed)
Continue calcium with D and weekly alendronate at discharge.

## 2012-04-22 ENCOUNTER — Encounter: Payer: Self-pay | Admitting: Internal Medicine

## 2012-12-12 ENCOUNTER — Encounter: Payer: Self-pay | Admitting: Cardiology

## 2012-12-13 ENCOUNTER — Encounter (INDEPENDENT_AMBULATORY_CARE_PROVIDER_SITE_OTHER): Payer: Self-pay

## 2012-12-13 ENCOUNTER — Encounter: Payer: Self-pay | Admitting: Cardiology

## 2012-12-13 ENCOUNTER — Ambulatory Visit (INDEPENDENT_AMBULATORY_CARE_PROVIDER_SITE_OTHER): Payer: Medicare Other | Admitting: Cardiology

## 2012-12-13 VITALS — BP 136/82 | HR 72 | Ht 67.0 in | Wt 132.8 lb

## 2012-12-13 DIAGNOSIS — I4891 Unspecified atrial fibrillation: Secondary | ICD-10-CM

## 2012-12-13 DIAGNOSIS — I359 Nonrheumatic aortic valve disorder, unspecified: Secondary | ICD-10-CM

## 2012-12-13 DIAGNOSIS — I77810 Thoracic aortic ectasia: Secondary | ICD-10-CM

## 2012-12-13 DIAGNOSIS — Z7901 Long term (current) use of anticoagulants: Secondary | ICD-10-CM

## 2012-12-13 DIAGNOSIS — I350 Nonrheumatic aortic (valve) stenosis: Secondary | ICD-10-CM

## 2012-12-13 DIAGNOSIS — I10 Essential (primary) hypertension: Secondary | ICD-10-CM

## 2012-12-13 HISTORY — DX: Thoracic aortic ectasia: I77.810

## 2012-12-13 HISTORY — DX: Nonrheumatic aortic (valve) stenosis: I35.0

## 2012-12-13 HISTORY — DX: Long term (current) use of anticoagulants: Z79.01

## 2012-12-13 NOTE — Patient Instructions (Signed)
Your physician recommends that you continue on your current medications as directed. Please refer to the Current Medication list given to you today.  Your physician wants you to follow-up in: 6 months with Dr. Skains. You will receive a reminder letter in the mail two months in advance. If you don't receive a letter, please call our office to schedule the follow-up appointment.  

## 2012-12-13 NOTE — Progress Notes (Signed)
[address] [place], [place]  [address] Phone: [phone] Fax:  [phone]  Date:  12/13/2012   ID:  Julie Mosley, DOB [birth year], MRN [id number]  PCP:  Cain Saupe, MD   History of Present Illness: Julie Mosley is a 79 y.o. female with persistent atrial fibrillation, hypertension, prior stroke/TIA, dilated aortic root currently 4.5 cm slightly up from 4.3 cm, mild aortic stenosis, prior bradycardia on no AV nodal blocking agents with rate-controlled atrial fibrillation, chronic anticoagulation here for followup. Had dermatology issue on nose. Went to hospital and rehab due to back pain. MRI's. Injection helped. Fell once in bathroom. No syncope..  Patient denies chest pain, palpitations, (resolved) dizziness, syncope, swelling, SOB, nor PND. Walking a mile a day.  She did injure herself, hit her head with minor laceration. No neurologic consequences. Overall she's doing well.    Wt Readings from Last 3 Encounters:  12/13/12 132 lb 12.8 oz (60.238 kg)  04/02/12 144 lb 8 oz (65.545 kg)     Past Medical History  Diagnosis Date  . TIA (transient ischemic attack)   . Hypertension   . Cancer     Past Surgical History  Procedure Laterality Date  . Hip surgery    . Cesarean section    . Breast lumpectomy      bilateral  . Skin biopsy      From left foot.    Current Outpatient Prescriptions  Medication Sig Dispense Refill  . ALENDRONATE SODIUM PO Take by mouth once a week.      . bisacodyl (DULCOLAX) 5 MG EC tablet Take 2 tablets (10 mg total) by mouth daily as needed.  30 tablet    . calcium carbonate (OS-CAL - DOSED IN MG OF ELEMENTAL CALCIUM) 1250 MG tablet Take 1 tablet by mouth daily.      Marland Kitchen glucosamine-chondroitin 500-400 MG tablet Take 1 tablet by mouth 3 (three) times daily.      . hydrochlorothiazide (HYDRODIURIL) 25 MG tablet Take 25 mg by mouth daily.      Marland Kitchen lisinopril (PRINIVIL,ZESTRIL) 40 MG tablet Take 1 tablet (40 mg total) by mouth  daily.      . Misc Natural Products (OSTEO BI-FLEX JOINT SHIELD PO) Take by mouth.      . Multiple Vitamins-Minerals (CENTRUM SILVER PO) Take by mouth daily.      Marland Kitchen omega-3 acid ethyl esters (LOVAZA) 1 G capsule Take 1 g by mouth daily.      Marland Kitchen warfarin (COUMADIN) 1 MG tablet Take 1 mg by mouth as directed. MWF,Sa 1mg  T,TH,Su take .5mg        No current facility-administered medications for this visit.    Allergies:    Allergies  Allergen Reactions  . Valium [Diazepam] Other (See Comments)    Stopped breathing    Social History:  The patient  reports that she quit smoking about 37 years ago. She has never used smokeless tobacco. She reports that she does not drink alcohol or use illicit drugs.   ROS:  Please see the history of present illness.   No bleeding, no CP, no orthopnea, no PND    PHYSICAL EXAM: VS:  BP 136/82  Pulse 72  Ht 5\' 7"  (1.702 m)  Wt 132 lb 12.8 oz (60.238 kg)  BMI 20.79 kg/m2 Thin, elderly, in no acute distress HEENT: normal Neck: no JVD Cardiac:  normal S1, S2; RRR; 2/6 systolic murmur right upper sternal border Lungs:  clear  to auscultation bilaterally, no wheezing, rhonchi or rales Abd: soft, nontender, no hepatomegaly Ext: no edema Skin: warm and dry Neuro: no focal abnormalities noted  EKG:  Previously atrial fibrillation was well rate controlled  ASSESSMENT AND PLAN:  1. Atrial fibrillation-permanent. Well rate controlled. No changes made. 2. Chronic anticoagulation-Dr. Jillyn Hidden is monitoring her Coumadin. No bleeding. 3. Hypertension-currently well controlled on multidrug regimen. 4. Mild aortic stenosis-murmur appreciated. Not hemodynamically significant. 5. Dilated aortic root-4.5 cm. Continue to treat blood pressure.  Signed, Donato Schultz, MD [facility]  12/13/2012 2:34 PM

## 2013-06-12 ENCOUNTER — Encounter: Payer: Self-pay | Admitting: Cardiology

## 2013-06-12 ENCOUNTER — Ambulatory Visit (INDEPENDENT_AMBULATORY_CARE_PROVIDER_SITE_OTHER): Payer: Medicare Other | Admitting: Cardiology

## 2013-06-12 VITALS — BP 160/80 | HR 58 | Ht 67.0 in | Wt 134.0 lb

## 2013-06-12 DIAGNOSIS — Z7901 Long term (current) use of anticoagulants: Secondary | ICD-10-CM

## 2013-06-12 DIAGNOSIS — I77810 Thoracic aortic ectasia: Secondary | ICD-10-CM

## 2013-06-12 DIAGNOSIS — I4891 Unspecified atrial fibrillation: Secondary | ICD-10-CM

## 2013-06-12 DIAGNOSIS — I10 Essential (primary) hypertension: Secondary | ICD-10-CM

## 2013-06-12 DIAGNOSIS — Z8673 Personal history of transient ischemic attack (TIA), and cerebral infarction without residual deficits: Secondary | ICD-10-CM

## 2013-06-12 MED ORDER — AMLODIPINE BESYLATE 2.5 MG PO TABS: 2.5000 mg | ORAL_TABLET | Freq: Every day | ORAL | Status: DC

## 2013-06-12 NOTE — Progress Notes (Signed)
[place] [place], [place]  [address] Phone: [phone] Fax:  [phone]  Date:  06/12/2013   ID:  Julie Mosley, DOB 1926/06/22, MRN [id number]  PCP:  Antony Blackbird, MD   History of Present Illness: Julie Mosley is a 80 y.o. female with persistent atrial fibrillation, hypertension, prior stroke/TIA, dilated aortic root currently 4.5 cm slightly up from 4.3 cm, mild aortic stenosis, prior bradycardia on no AV nodal blocking agents with rate-controlled atrial fibrillation, chronic anticoagulation here for followup. Fell once in bathroom. No syncope..  Patient denies chest pain, palpitations, (resolved) dizziness, syncope, swelling, SOB, nor PND. Walking a mile a day.     Wt Readings from Last 3 Encounters:  06/12/13 134 lb (60.782 kg)  12/13/12 132 lb 12.8 oz (60.238 kg)  04/02/12 144 lb 8 oz (65.545 kg)     Past Medical History  Diagnosis Date  . TIA (transient ischemic attack)   . Hypertension   . Cancer   . Chronic anticoagulation 12/13/2012    Followed by Dr. Chapman Fitch  . Mild aortic stenosis 12/13/2012  . Dilated aortic root 12/13/2012    4.5 cm 2013.     Past Surgical History  Procedure Laterality Date  . Hip surgery    . Cesarean section    . Breast lumpectomy      bilateral  . Skin biopsy      From left foot.    Current Outpatient Prescriptions  Medication Sig Dispense Refill  . ALENDRONATE SODIUM PO Take by mouth once a week.      . calcium carbonate (OS-CAL - DOSED IN MG OF ELEMENTAL CALCIUM) 1250 MG tablet Take 1 tablet by mouth daily.      Marland Kitchen glucosamine-chondroitin 500-400 MG tablet Take 1 tablet by mouth 3 (three) times daily.      . hydrochlorothiazide (HYDRODIURIL) 25 MG tablet Take 25 mg by mouth daily.      Marland Kitchen lisinopril (PRINIVIL,ZESTRIL) 20 MG tablet Take 20 mg by mouth 2 (two) times daily.      . Misc Natural Products (OSTEO BI-FLEX JOINT SHIELD PO) Take by mouth.      . Multiple Vitamins-Minerals (CENTRUM SILVER PO) Take  by mouth daily.      Marland Kitchen omega-3 acid ethyl esters (LOVAZA) 1 G capsule Take 1 g by mouth daily.      Marland Kitchen warfarin (COUMADIN) 1 MG tablet Take 1 mg by mouth as directed. MWF,Sa 1mg  T,TH,Su take .5mg        No current facility-administered medications for this visit.    Allergies:    Allergies  Allergen Reactions  . Valium [Diazepam] Other (See Comments)    Stopped breathing    Social History:  The patient  reports that she quit smoking about 38 years ago. She has never used smokeless tobacco. She reports that she does not drink alcohol or use illicit drugs.   ROS:  Please see the history of present illness.   No bleeding, no CP, no orthopnea, no PND    PHYSICAL EXAM: VS:  BP 160/80  Pulse 58  Ht 5\' 7"  (1.702 m)  Wt 134 lb (60.782 kg)  BMI 20.98 kg/m2 Thin, elderly, in no acute distress HEENT: normal Neck: no JVD Cardiac:  Irregularly irregular; RRR; 2/6 systolic murmur right upper sternal border Lungs:  clear to auscultation bilaterally, no wheezing, rhonchi or rales Abd: soft, nontender, no hepatomegaly Ext: no edema Skin: warm and dry, varicose veins feet  bilaterally Neuro: no focal abnormalities noted  EKG:  Previously atrial fibrillation was well rate controlled 06/12/13-atrial fibrillation heart rate 58, occasional PVCs, vertical axis. Poor R wave progression. ECHO: 2009 mild AS  ASSESSMENT AND PLAN:  1. Atrial fibrillation-permanent. Well rate controlled. No changes made. 2. Chronic anticoagulation-Dr. Chapman Fitch is monitoring her Coumadin. No bleeding. 3. Hypertension-currently mildly elevated, we will start amlodipine 2.5 mg to her drug regimen.  4. Mild aortic stenosis-murmur appreciated. Not hemodynamically significant. ECHO ordered 5. Dilated aortic root-4.5 cm. Continue to treat blood pressure. 6. 6 month f/u  Signed, Candee Furbish, MD [facility]  06/12/2013 11:14 AM

## 2013-06-12 NOTE — Patient Instructions (Signed)
Your physician has recommended you make the following change in your medication:  1) START Amlodipine 2.5mg  daily. An Rx has been sent to your pharmacy  Your physician has requested that you have an echocardiogram. Echocardiography is a painless test that uses sound waves to create images of your heart. It provides your doctor with information about the size and shape of your heart and how well your heart's chambers and valves are working. This procedure takes approximately one hour. There are no restrictions for this procedure.   Your physician wants you to follow-up in: 6 months You will receive a reminder letter in the mail two months in advance. If you don't receive a letter, please call our office to schedule the follow-up appointment.

## 2013-06-14 ENCOUNTER — Ambulatory Visit (HOSPITAL_COMMUNITY): Payer: Medicare Other | Attending: Cardiology | Admitting: Cardiology

## 2013-06-14 DIAGNOSIS — I4891 Unspecified atrial fibrillation: Secondary | ICD-10-CM

## 2013-06-14 DIAGNOSIS — I350 Nonrheumatic aortic (valve) stenosis: Secondary | ICD-10-CM

## 2013-06-14 DIAGNOSIS — I77810 Thoracic aortic ectasia: Secondary | ICD-10-CM

## 2013-06-14 DIAGNOSIS — I359 Nonrheumatic aortic valve disorder, unspecified: Secondary | ICD-10-CM | POA: Insufficient documentation

## 2013-06-14 NOTE — Progress Notes (Signed)
Echo performed. 

## 2013-06-21 ENCOUNTER — Telehealth: Payer: Self-pay

## 2013-06-21 NOTE — Telephone Encounter (Signed)
Message copied by Lamar Laundry on Fri Jun 21, 2013  8:51 AM ------      Message from: Jerline Pain      Created: Fri Jun 14, 2013  5:20 PM       Normal EF. Moderate aortic stenosis. Repeat in one year. ------

## 2013-06-21 NOTE — Telephone Encounter (Signed)
pt given echo results.Normal EF. Moderate aortic stenosis. Repeat in one year.pt verbalized understanding.

## 2013-09-21 ENCOUNTER — Encounter: Payer: Self-pay | Admitting: Cardiology

## 2013-12-11 ENCOUNTER — Encounter: Payer: Self-pay | Admitting: Cardiology

## 2013-12-11 ENCOUNTER — Ambulatory Visit (INDEPENDENT_AMBULATORY_CARE_PROVIDER_SITE_OTHER): Payer: Medicare Other | Admitting: Cardiology

## 2013-12-11 VITALS — BP 150/92 | HR 74 | Ht 67.0 in | Wt 137.0 lb

## 2013-12-11 DIAGNOSIS — I77810 Thoracic aortic ectasia: Secondary | ICD-10-CM

## 2013-12-11 DIAGNOSIS — I10 Essential (primary) hypertension: Secondary | ICD-10-CM

## 2013-12-11 DIAGNOSIS — I481 Persistent atrial fibrillation: Secondary | ICD-10-CM

## 2013-12-11 DIAGNOSIS — I4819 Other persistent atrial fibrillation: Secondary | ICD-10-CM

## 2013-12-11 DIAGNOSIS — Z7901 Long term (current) use of anticoagulants: Secondary | ICD-10-CM

## 2013-12-11 MED ORDER — AMLODIPINE BESYLATE 5 MG PO TABS: 5.0000 mg | ORAL_TABLET | Freq: Every day | ORAL | Status: DC

## 2013-12-11 NOTE — Patient Instructions (Signed)
Please increase your Amlodipine to 5 mg a day. Continue all other medications as listed.  Follow up in 6 months with Dr. Marlou Porch.  You will receive a letter in the mail 2 months before you are due.  Please call us when you receive this letter to schedule your follow up appointment.

## 2013-12-11 NOTE — Progress Notes (Signed)
[facility] [place]. [address] [place], [place]  [address] Phone: [phone] Fax:  [phone]  Date:  12/11/2013   ID:  Julie Mosley, DOB 07/20/1926, MRN [id number]  PCP:  Antony Blackbird, MD   History of Present Illness: Julie Mosley is a 80 y.o. female with persistent atrial fibrillation, hypertension, prior stroke/TIA, dilated aortic root currently 4.5 cm slightly up from 4.3 cm, mild aortic stenosis, prior bradycardia on no AV nodal blocking agents with rate-controlled atrial fibrillation, chronic anticoagulation here for followup.  Fell once in bathroom. No syncope..  Patient denies chest pain, palpitations, (resolved) dizziness, syncope, swelling, SOB, nor PND. Walking a mile a day.  Son at 33 had stent LAD.  She does have chronic osteoarthritis.     Wt Readings from Last 3 Encounters:  12/11/13 137 lb (62.143 kg)  06/12/13 134 lb (60.782 kg)  12/13/12 132 lb 12.8 oz (60.238 kg)     Past Medical History  Diagnosis Date  . TIA (transient ischemic attack)   . Hypertension   . Cancer   . Chronic anticoagulation 12/13/2012    Followed by Dr. Chapman Fitch  . Mild aortic stenosis 12/13/2012  . Dilated aortic root 12/13/2012    4.5 cm 2013.     Past Surgical History  Procedure Laterality Date  . Hip surgery    . Cesarean section    . Breast lumpectomy      bilateral  . Skin biopsy      From left foot.    Current Outpatient Prescriptions  Medication Sig Dispense Refill  . ALENDRONATE SODIUM PO Take by mouth once a week.    Marland Kitchen amLODipine (NORVASC) 2.5 MG tablet Take 1 tablet (2.5 mg total) by mouth daily. 30 tablet 11  . calcium carbonate (OS-CAL - DOSED IN MG OF ELEMENTAL CALCIUM) 1250 MG tablet Take 1 tablet by mouth daily.    Marland Kitchen glucosamine-chondroitin 500-400 MG tablet Take 1 tablet by mouth 3 (three) times daily.    . hydrochlorothiazide (HYDRODIURIL) 25 MG tablet Take 25 mg by mouth daily.    Marland Kitchen HYDROcodone-acetaminophen (NORCO/VICODIN) 5-325 MG per tablet  Take 1 tablet by mouth every 6 (six) hours as needed for moderate pain.    Marland Kitchen lisinopril (PRINIVIL,ZESTRIL) 20 MG tablet Take 20 mg by mouth 2 (two) times daily.    . Misc Natural Products (OSTEO BI-FLEX JOINT SHIELD PO) Take by mouth.    . Multiple Vitamins-Minerals (CENTRUM SILVER PO) Take by mouth daily.    Marland Kitchen omega-3 acid ethyl esters (LOVAZA) 1 G capsule Take 1 g by mouth daily.    Marland Kitchen warfarin (COUMADIN) 1 MG tablet Take 5 mg by mouth as directed. MWF,Sa 1mg  T,TH,Su take .5mg      No current facility-administered medications for this visit.    Allergies:    Allergies  Allergen Reactions  . Valium [Diazepam] Other (See Comments)    Stopped breathing    Social History:  The patient  reports that she quit smoking about 38 years ago. She has never used smokeless tobacco. She reports that she does not drink alcohol or use illicit drugs.   ROS:  Please see the history of present illness.   No bleeding, no CP, no orthopnea, no PND    PHYSICAL EXAM: VS:  BP 150/92 mmHg  Pulse 74  Ht 5\' 7"  (1.702 m)  Wt 137 lb (62.143 kg)  BMI 21.45 kg/m2 Thin, elderly, in no acute distress HEENT: normal Neck: no JVD  Cardiac:  Irregularly irregular; RRR; 2/6 systolic murmur right upper sternal border Lungs:  clear to auscultation bilaterally, no wheezing, rhonchi or rales Abd: soft, nontender, no hepatomegaly Ext: no edema Skin: warm and dry, varicose veins feet bilaterally Neuro: no focal abnormalities noted  EKG:  Previously atrial fibrillation was well rate controlled 06/12/13-atrial fibrillation heart rate 58, occasional PVCs, vertical axis. Poor R wave progression.  ECHO: 06/14/13 - Left ventricle: The cavity size was normal. There was moderate focal basal hypertrophy. Systolic function was normal. The estimated ejection fraction was in the range of 55% to 60%. There is akinesis of the basalinferior myocardium. - Aortic valve: Severe diffuse thickening and calcification. There was  moderate stenosis. There was mild regurgitation. - Aorta: Aortic root dimension: 42 mm (ED). Ascending aortic diameter: 45 mm (S). - Ascending aorta: The ascending aorta was mildly dilated. - Mitral valve: Calcified annulus. There was mild regurgitation. - Left atrium: The atrium was moderately dilated. - Right atrium: The atrium was mildly dilated. - Tricuspid valve: There was moderate regurgitation. - Pulmonary arteries: PA peak pressure: 44 mm Hg (S).  ASSESSMENT AND PLAN:  1. Atrial fibrillation-permanent. Well rate controlled. No changes made. 2. Chronic anticoagulation-Dr. Chapman Fitch is monitoring her Coumadin. No bleeding. 3. Hypertension-currently mildly elevated, we will increase amlodipine to 5 mg to her drug regimen. Discussed potential side effects, edema. 4. Moderate aortic stenosis-murmur appreciated. Not hemodynamically significant.  5. Dilated aortic root-4.5 cm. Continue to treat blood pressure. 6. 6 month f/u  Signed, Candee Furbish, MD [facility]  12/11/2013 3:21 PM

## 2014-01-06 ENCOUNTER — Ambulatory Visit
Admission: RE | Admit: 2014-01-06 | Discharge: 2014-01-06 | Disposition: A | Discharge status: Home / Self Care | Payer: Medicare Other | Source: Ambulatory Visit | Attending: Family | Admitting: Family

## 2014-01-06 ENCOUNTER — Other Ambulatory Visit: Payer: Self-pay | Admitting: Family

## 2014-01-06 DIAGNOSIS — R059 Cough, unspecified: Secondary | ICD-10-CM

## 2014-01-06 DIAGNOSIS — R05 Cough: Secondary | ICD-10-CM

## 2014-01-15 ENCOUNTER — Encounter (INDEPENDENT_AMBULATORY_CARE_PROVIDER_SITE_OTHER): Payer: Self-pay

## 2014-01-15 ENCOUNTER — Encounter: Payer: Self-pay | Admitting: Internal Medicine

## 2014-01-15 ENCOUNTER — Ambulatory Visit (INDEPENDENT_AMBULATORY_CARE_PROVIDER_SITE_OTHER): Payer: Medicare Other | Admitting: Internal Medicine

## 2014-01-15 VITALS — BP 122/80 | HR 84 | Temp 98.1°F | Ht 65.0 in | Wt 136.4 lb

## 2014-01-15 DIAGNOSIS — J8410 Pulmonary fibrosis, unspecified: Secondary | ICD-10-CM

## 2014-01-15 DIAGNOSIS — I10 Essential (primary) hypertension: Secondary | ICD-10-CM

## 2014-01-15 NOTE — Progress Notes (Signed)
Subjective:     Patient ID: Julie Mosley, female   DOB: 03-01-1926,   MRN: [id number]  HPI  51 yowf quit smoking 1977 with no resp problems then and referred to pulmonary clinic 01/15/2014 by Dr Anderson [place] brown with abn cxr obtained for cough which had resolved by her first pulmonary eval p rx with prednisone.  Sees Cammie Fulp for primary care.   01/15/2014 1st [facility] office visit/ Julie Mosley   Chief Complaint  Patient presents with  . Advice Only    Pt c/o sl. cough with yellow mucus. Patient denies chest congestion/tightness, wheezing, sob. She is not really sure why she is here.  all better at ov Not limited by breathing from desired activities    No obvious day to day or daytime variabilty or assoc chronic cough or cp or chest tightness, subjective wheeze overt sinus or hb symptoms. No unusual exp hx or h/o childhood pna/ asthma or knowledge of premature birth.  Sleeping ok without nocturnal  or early am exacerbation  of respiratory  c/o's or need for noct saba. Also denies any obvious fluctuation of symptoms with weather or environmental changes or other aggravating or alleviating factors except as outlined above   Current Medications, Allergies, Complete Past Medical History, Past Surgical History, Family History, and Social History were reviewed in [employer] record.  ROS  The following are not active complaints unless bolded sore throat, dysphagia, dental problems, itching, sneezing,  nasal congestion or excess/ purulent secretions, ear ache,   fever, chills, sweats, unintended wt loss, pleuritic or exertional cp, hemoptysis,  orthopnea pnd or leg swelling, presyncope, palpitations, heartburn, abdominal pain, anorexia, nausea, vomiting, diarrhea  or change in bowel or urinary habits, change in stools or urine, dysuria,hematuria,  rash, arthralgias, visual complaints, headache, numbness weakness or ataxia or problems with walking or coordination,  change in  mood/affect or memory.         Review of Systems     Objective:   Physical Exam    delightful  amb wf nad mild hoarseness/ voice fatigue and occ throat clearing   Wt Readings from Last 3 Encounters:  01/15/14 136 lb 6.4 oz (61.871 kg)  12/11/13 137 lb (62.143 kg)  06/12/13 134 lb (60.782 kg)    Vital signs reviewed  HEENT:  Full dentures/ nl turbinates, and orophanx. Nl external ear canals without cough reflex   NECK :  without JVD/Nodes/TM/ nl carotid upstrokes bilaterally   LUNGS: no acc muscle use, clear to A and P bilaterally without cough on insp or exp maneuvers   CV:  RRR  no s3  -  III/VI sys murmur over apex to R of sternum - no  increase in P2, no edema   ABD:  soft and nontender with nl excursion in the supine position. No bruits or organomegaly, bowel sounds nl  MS:  warm without deformities, calf tenderness, cyanosis or clubbing  SKIN: warm and dry without lesions    NEURO:  alert, approp, no deficits  cxr 01/06/14 COPD and pulmonary fibrotic change without evidence of pneumonia. There is stable enlargement of the cardiac silhouette without pulmonary edema.  Mod thoracic kyphosis     Assessment:

## 2014-01-15 NOTE — Patient Instructions (Signed)
If your cough or breathing gets worse again I would recommend first a trial off your lisinopril and then return if not improving after a few weeks  Pulmonary follow up is as needed

## 2014-01-16 ENCOUNTER — Encounter: Payer: Self-pay | Admitting: Internal Medicine

## 2014-01-16 NOTE — Assessment & Plan Note (Signed)
Well controlled on ACEi chronically but please note  ACE inhibitors are problematic in  pts with airway complaints because  even experienced pulmonologists can't always distinguish ace effects from copd/asthma/pnds/ allergies etc.  By themselves they don't actually cause a problem, much like oxygen can't by itself start a fire, but they certainly serve as a powerful catalyst or enhancer for any "fire"  or inflammatory process in the upper airway, be it caused by an ET  tube or more commonly reflux (especially in the obese or pts with known GERD or who are on biphoshonates) or URI's, due to interference with bradykinin clearance.  The effects of acei on bradykinin levels occurs in 100% of pt's on acei (unless they surreptitiously stop the med!) but the classic cough is only reported in 5%.  This leaves 95% of pts on acei's  with a variety of syndromes including no identifiable symptom in most  vs non-specific symptoms that wax and wane depending on what other insult is occuring at the level of the upper airway like a URI/ sinus/allergy issue or GERD   Therefore would not pursue any persistent resp complaint in this nice lady s a full 4 week trial off ACEi and on ARB or other alternative first

## 2014-01-16 NOTE — Assessment & Plan Note (Addendum)
-   present on cxr since 11/2007  -- 01/15/2014  Walked RA x 3 laps @ 185 ft each stopped due to  End of study, nl pace, talked the whole time, no desat   DDx for pulmonary fibrosis  includes idiopathic pulmonary fibrosis, pulmonary fibrosis associated with rheumatologic diseases (which have a relatively benign course in most cases) , adverse effect from  drugs such as chemotherapy or amiodarone exposure, nonspecific interstitial pneumonia which is typically steroid responsive, and chronic hypersensitivity pneumonitis.   In active  smokers Langerhan's Cell  Histiocyctosis (eosinophilic granuomatosis),  DIP,  and Respiratory Bronchiolitis ILD also need to be considered, but don't apply to her if she quit when she says she did.  Given the lack of clinical progression x 6 years she probably had some form of acute lung injury with residual pf rather than any form of progressive dz and no further w/u needed

## 2014-06-09 ENCOUNTER — Ambulatory Visit (INDEPENDENT_AMBULATORY_CARE_PROVIDER_SITE_OTHER): Payer: Medicare Other | Admitting: Cardiology

## 2014-06-09 ENCOUNTER — Encounter: Payer: Self-pay | Admitting: Cardiology

## 2014-06-09 VITALS — BP 138/78 | HR 65 | Ht 67.0 in | Wt 134.0 lb

## 2014-06-09 DIAGNOSIS — I4819 Other persistent atrial fibrillation: Secondary | ICD-10-CM

## 2014-06-09 DIAGNOSIS — Z7901 Long term (current) use of anticoagulants: Secondary | ICD-10-CM

## 2014-06-09 DIAGNOSIS — I77810 Thoracic aortic ectasia: Secondary | ICD-10-CM | POA: Diagnosis not present

## 2014-06-09 DIAGNOSIS — I481 Persistent atrial fibrillation: Secondary | ICD-10-CM | POA: Diagnosis not present

## 2014-06-09 DIAGNOSIS — I359 Nonrheumatic aortic valve disorder, unspecified: Secondary | ICD-10-CM

## 2014-06-09 NOTE — Patient Instructions (Signed)
Medication Instructions:  Your physician recommends that you continue on your current medications as directed. Please refer to the Current Medication list given to you today.  Testing/Procedures: Your physician has requested that you have an echocardiogram. Echocardiography is a painless test that uses sound waves to create images of your heart. It provides your doctor with information about the size and shape of your heart and how well your heart's chambers and valves are working. This procedure takes approximately one hour. There are no restrictions for this procedure.  Follow-Up: Follow up in 6 months with Dr. Skains.  You will receive a letter in the mail 2 months before you are due.  Please call us when you receive this letter to schedule your follow up appointment.  Thank you for choosing [facility]!!      

## 2014-06-09 NOTE — Progress Notes (Signed)
[facility]. [address], [address] [place], [place]  [address] Phone: [phone] Fax:  [phone]  Date:  06/09/2014   ID:  Julie Mosley, DOB 02/05/1926, MRN [id number]  PCP:  Antony Blackbird, MD   History of Present Illness: Julie Mosley is a 81 y.o. female with persistent atrial fibrillation, hypertension, prior stroke/TIA, dilated aortic root currently 4.5 cm slightly up from 4.3 cm, mild aortic stenosis, prior bradycardia on no AV nodal blocking agents with rate-controlled atrial fibrillation, chronic anticoagulation here for followup.  Fell once in bathroom. No syncope..  Patient denies chest pain, palpitations, (resolved) dizziness, syncope, swelling, SOB, nor PND. Walking a mile a day.  Son at 63 had stent LAD. Had shingles twice. Left leg.  She does have chronic osteoarthritis.   Wt Readings from Last 3 Encounters:  06/09/14 134 lb (60.782 kg)  01/15/14 136 lb 6.4 oz (61.871 kg)  12/11/13 137 lb (62.143 kg)     Past Medical History  Diagnosis Date  . TIA (transient ischemic attack)   . Hypertension   . Cancer   . Chronic anticoagulation 12/13/2012    Followed by Dr. Chapman Fitch  . Mild aortic stenosis 12/13/2012  . Dilated aortic root 12/13/2012    4.5 cm 2013.     Past Surgical History  Procedure Laterality Date  . Hip surgery    . Cesarean section    . Breast lumpectomy      bilateral  . Skin biopsy      From left foot.    Current Outpatient Prescriptions  Medication Sig Dispense Refill  . ALENDRONATE SODIUM PO Take 1 tablet by mouth once a week.     Marland Kitchen amLODipine (NORVASC) 5 MG tablet Take 1 tablet (5 mg total) by mouth daily. 30 tablet 11  . calcium carbonate (OS-CAL - DOSED IN MG OF ELEMENTAL CALCIUM) 1250 MG tablet Take 1 tablet by mouth daily.    Marland Kitchen glucosamine-chondroitin 500-400 MG tablet Take 1 tablet by mouth 3 (three) times daily.    . hydrochlorothiazide (HYDRODIURIL) 25 MG tablet Take 25 mg by mouth daily.    Marland Kitchen HYDROcodone-acetaminophen  (NORCO/VICODIN) 5-325 MG per tablet Take 1 tablet by mouth every 6 (six) hours as needed for moderate pain.    Marland Kitchen lisinopril (PRINIVIL,ZESTRIL) 20 MG tablet Take 20 mg by mouth 2 (two) times daily.    . Misc Natural Products (OSTEO BI-FLEX JOINT SHIELD PO) Take 1 tablet by mouth daily.     . Multiple Vitamins-Minerals (CENTRUM SILVER PO) Take 1 tablet by mouth daily.     Marland Kitchen omega-3 acid ethyl esters (LOVAZA) 1 G capsule Take 1 g by mouth daily.    Marland Kitchen warfarin (COUMADIN) 1 MG tablet Take 5 mg by mouth as directed. MWF,Sa 1mg  T,TH,Su take .5mg      No current facility-administered medications for this visit.    Allergies:    Allergies  Allergen Reactions  . Valium [Diazepam] Other (See Comments)    Stopped breathing    Social History:  The patient  reports that she quit smoking about 39 years ago. She has never used smokeless tobacco. She reports that she does not drink alcohol or use illicit drugs.   ROS:  Please see the history of present illness.   No bleeding, no CP, no orthopnea, no PND    PHYSICAL EXAM: VS:  BP 138/78 mmHg  Pulse 65  Ht 5\' 7"  (1.702 m)  Wt 134 lb (60.782 kg)  BMI  20.98 kg/m2 Thin, elderly, in no acute distress HEENT: normal Neck: no JVD Cardiac:  Irregularly irregular; RRR; 2/6 systolic murmur right upper sternal border Lungs:  clear to auscultation bilaterally, no wheezing, rhonchi or rales Abd: soft, nontender, no hepatomegaly Ext: no edemaPreviously had trace edema Skin: warm and dry, varicose veins feet bilaterally Neuro: no focal abnormalities noted  EKG:  06/09/14-atrial fibrillation heart rate 65 bpm with occasional PVCs, vertical axis. Prior 06/12/13-atrial fibrillation heart rate 58, occasional PVCs, vertical axis. Poor R wave progression.  ECHO: 06/14/13 - Left ventricle: The cavity size was normal. There was moderate focal basal hypertrophy. Systolic function was normal. The estimated ejection fraction was in the range of 55% to 60%. There is  akinesis of the basalinferior myocardium. - Aortic valve: Severe diffuse thickening and calcification. There was moderate stenosis. There was mild regurgitation. - Aorta: Aortic root dimension: 42 mm (ED). Ascending aortic diameter: 45 mm (S). - Ascending aorta: The ascending aorta was mildly dilated. - Mitral valve: Calcified annulus. There was mild regurgitation. - Left atrium: The atrium was moderately dilated. - Right atrium: The atrium was mildly dilated. - Tricuspid valve: There was moderate regurgitation. - Pulmonary arteries: PA peak pressure: 44 mm Hg (S).  ASSESSMENT AND PLAN:  1. Atrial fibrillation-permanent. Well rate controlled. No changes made. Doing well 2. Chronic anticoagulation-Dr. Chapman Fitch is monitoring her Coumadin. No bleeding. 3. Hypertension-currently mildly elevated, we will increase amlodipine to 5 mg to her drug regimen. Discussed potential side effects, edema. She has very mild ankle edema. 4. Moderate aortic stenosis-murmur appreciated. Not hemodynamically significant. Feeding echocardiogram 5. Dilated aortic root-4.5 cm. Continue to treat blood pressure. 6. 6 month f/u  Signed, Candee Furbish, MD [facility]  06/09/2014 2:19 PM

## 2014-06-18 ENCOUNTER — Other Ambulatory Visit: Payer: Self-pay

## 2014-06-18 ENCOUNTER — Ambulatory Visit (HOSPITAL_COMMUNITY): Payer: Medicare Other | Attending: Cardiology

## 2014-06-18 DIAGNOSIS — I4891 Unspecified atrial fibrillation: Secondary | ICD-10-CM | POA: Diagnosis present

## 2014-06-18 DIAGNOSIS — I517 Cardiomegaly: Secondary | ICD-10-CM | POA: Diagnosis not present

## 2014-06-18 DIAGNOSIS — I359 Nonrheumatic aortic valve disorder, unspecified: Secondary | ICD-10-CM

## 2014-06-18 DIAGNOSIS — I350 Nonrheumatic aortic (valve) stenosis: Secondary | ICD-10-CM | POA: Diagnosis not present

## 2014-06-18 DIAGNOSIS — I481 Persistent atrial fibrillation: Secondary | ICD-10-CM

## 2014-06-18 DIAGNOSIS — I351 Nonrheumatic aortic (valve) insufficiency: Secondary | ICD-10-CM | POA: Diagnosis not present

## 2014-06-18 DIAGNOSIS — I4819 Other persistent atrial fibrillation: Secondary | ICD-10-CM

## 2014-06-19 ENCOUNTER — Telehealth: Payer: Self-pay

## 2014-06-19 NOTE — Telephone Encounter (Signed)
-----   Message from Jerline Pain, MD sent at 06/18/2014  5:25 PM EDT ----- - Normal LV size and function. Severe aortic stenosis and mild regurgitation. Moderate RV systolic dysfunction. RVSP 34 mmHg is most probably underestimated secondary to RV dysfunction.  She is not having any symptoms currently. We will continue to monitor for symptoms especially in light of her severe aortic stenosis. Candee Furbish, MD

## 2014-06-19 NOTE — Telephone Encounter (Signed)
Pt aware of echo results with verbal understanding. Normal LV size and function. Severe aortic stenosis and mild regurgitation.  Moderate RV systolic dysfunction. RVSP 34 mmHg is most probably  underestimated secondary to RV dysfunction.       She is not having any symptoms currently. We will continue to monitor for symptoms especially in light of her severe aortic stenosis.          pt adv to call the office if she develops chest pain, sob, syncope She verbalized understanding.

## 2014-12-22 ENCOUNTER — Encounter: Payer: Self-pay | Admitting: Cardiology

## 2014-12-22 ENCOUNTER — Ambulatory Visit (INDEPENDENT_AMBULATORY_CARE_PROVIDER_SITE_OTHER): Payer: Medicare Other | Admitting: Cardiology

## 2014-12-22 VITALS — BP 138/82 | HR 107 | Ht 67.0 in | Wt 136.0 lb

## 2014-12-22 DIAGNOSIS — I77810 Thoracic aortic ectasia: Secondary | ICD-10-CM | POA: Diagnosis not present

## 2014-12-22 DIAGNOSIS — I10 Essential (primary) hypertension: Secondary | ICD-10-CM | POA: Diagnosis not present

## 2014-12-22 DIAGNOSIS — I350 Nonrheumatic aortic (valve) stenosis: Secondary | ICD-10-CM | POA: Diagnosis not present

## 2014-12-22 DIAGNOSIS — I481 Persistent atrial fibrillation: Secondary | ICD-10-CM

## 2014-12-22 DIAGNOSIS — I4819 Other persistent atrial fibrillation: Secondary | ICD-10-CM | POA: Insufficient documentation

## 2014-12-22 MED ORDER — AMLODIPINE BESYLATE 5 MG PO TABS: 5.0000 mg | ORAL_TABLET | Freq: Every day | ORAL | Status: DC

## 2014-12-22 MED ORDER — HYDROCHLOROTHIAZIDE 25 MG PO TABS: 25.0000 mg | ORAL_TABLET | Freq: Every day | ORAL | Status: DC

## 2014-12-22 MED ORDER — OMEGA-3-ACID ETHYL ESTERS 1 G PO CAPS: 1.0000 g | ORAL_CAPSULE | Freq: Every day | ORAL | Status: DC

## 2014-12-22 NOTE — Progress Notes (Signed)
[place]. [address], [address] [place], [place]  [address] Phone: [phone] Fax:  [phone]  Date:  12/22/2014   ID:  Julie Mosley, DOB 10-Oct-1926, MRN [id number]  PCP:  Julie Blackbird, MD   History of Present Illness: Julie Mosley is a 81 y.o. female with persistent atrial fibrillation, hypertension, prior stroke/TIA, dilated aortic root currently 4.5 cm slightly up from 4.3 cm, Moderate to severe aortic stenosis, prior bradycardia on no AV nodal blocking agents with rate-controlled atrial fibrillation, chronic anticoagulation here for followup.  No syncope.no dyspnea.  Patient denies chest pain, palpitations, (resolved) dizziness, syncope, swelling, SOB, nor PND. Walking a mile a day.she is still raking leaves.  Son at 38 had stent LAD. Had shingles twice. Left leg.  She does have chronic osteoarthritis.   Wt Readings from Last 3 Encounters:  12/22/14 136 lb (61.689 kg)  06/09/14 134 lb (60.782 kg)  01/15/14 136 lb 6.4 oz (61.871 kg)     Past Medical History  Diagnosis Date  . TIA (transient ischemic attack)   . Hypertension   . Cancer ([place])   . Chronic anticoagulation 12/13/2012    Followed by Dr. Chapman Mosley  . Mild aortic stenosis 12/13/2012  . Dilated aortic root ([place]) 12/13/2012    4.5 cm 2013.     Past Surgical History  Procedure Laterality Date  . Hip surgery    . Cesarean section    . Breast lumpectomy      bilateral  . Skin biopsy      From left foot.    Current Outpatient Prescriptions  Medication Sig Dispense Refill  . alendronate (FOSAMAX) 70 MG tablet Take 70 mg by mouth once a week. Take with a full glass of water on an empty stomach. Takes on Tuesday morning    . amLODipine (NORVASC) 5 MG tablet Take 1 tablet (5 mg total) by mouth daily. 30 tablet 11  . calcium carbonate (OS-CAL - DOSED IN MG OF ELEMENTAL CALCIUM) 1250 MG tablet Take 1 tablet by mouth daily.    Marland Kitchen glucosamine-chondroitin 500-400 MG tablet Take 1 tablet by mouth 3 (three) times  daily.    . hydrochlorothiazide (HYDRODIURIL) 25 MG tablet Take 25 mg by mouth daily.    Marland Kitchen lisinopril (PRINIVIL,ZESTRIL) 20 MG tablet Take 20 mg by mouth 2 (two) times daily.    . Misc Natural Products (OSTEO BI-FLEX JOINT SHIELD PO) Take 1 tablet by mouth daily.     . Multiple Vitamins-Minerals (CENTRUM SILVER PO) Take 1 tablet by mouth daily.     Marland Kitchen omega-3 acid ethyl esters (LOVAZA) 1 G capsule Take 1 g by mouth daily.    Marland Kitchen warfarin (COUMADIN) 1 MG tablet Take 5 mg by mouth as directed. MWF,Sa 1mg  T,TH,Su take .5mg      No current facility-administered medications for this visit.    Allergies:    Allergies  Allergen Reactions  . Valium [Diazepam] Other (See Comments)    Stopped breathing    Social History:  The patient  reports that she quit smoking about 39 years ago. She has never used smokeless tobacco. She reports that she does not drink alcohol or use illicit drugs.   ROS:  Please see the history of present illness.   No bleeding, no CP, no orthopnea, no PND    PHYSICAL EXAM: VS:  BP 138/82 mmHg  Pulse 107  Ht 5\' 7"  (1.702 m)  Wt 136 lb (61.689 kg)  BMI 21.30 kg/m2 Thin,  elderly, in no acute distress HEENT: normal Neck: no JVD Cardiac:  Irregularly irregular; RRR; 2/6 systolic murmur right upper sternal border Lungs:  clear to auscultation bilaterally, no wheezing, rhonchi or rales Abd: soft, nontender, no hepatomegaly Ext: no edemaPreviously had trace edema Skin: warm and dry, varicose veins feet bilaterally Neuro: no focal abnormalities noted  EKG:   06/09/14-atrial fibrillation heart rate 65 bpm with occasional PVCs, vertical axis. Prior 06/12/13-atrial fibrillation heart rate 58, occasional PVCs, vertical axis. Poor R wave progression.  ECHO: 06/14/13 - Left ventricle: The cavity size was normal. There was moderate focal basal hypertrophy. Systolic function was normal. The estimated ejection fraction was in the range of 55% to 60%. There is akinesis of the  basalinferior myocardium. - Aortic valve: Severe diffuse thickening and calcification. There was moderate stenosis. There was mild regurgitation. - Aorta: Aortic root dimension: 42 mm (ED). Ascending aortic diameter: 45 mm (S). - Ascending aorta: The ascending aorta was mildly dilated. - Mitral valve: Calcified annulus. There was mild regurgitation. - Left atrium: The atrium was moderately dilated. - Right atrium: The atrium was mildly dilated. - Tricuspid valve: There was moderate regurgitation. - Pulmonary arteries: PA peak pressure: 44 mm Hg (S).  ASSESSMENT AND PLAN:  1. Atrial fibrillation-permanent. Well rate controlled. No changes made. Doing well 2. Chronic anticoagulation-Dr. Chapman Mosley is monitoring her Coumadin. No bleeding. 3. Hypertension-currently mildly elevated, amlodipine to 5 mg to her drug regimen.minor pedal edema. Discussed potential side effects, edema. She has very mild ankle edema. 4. Moderate to severe aortic stenosis-murmur appreciated.  Echocardiogramal be checked in 6 months.asymptomatic 5. Dilated aortic root-4.5 cm. Continue to treat blood pressure. 6. 6 month f/u  Signed, Julie Furbish, MD [facility]  12/22/2014 2:47 PM

## 2014-12-22 NOTE — Patient Instructions (Signed)
Medication Instructions:  The current medical regimen is effective;  continue present plan and medications.  Testing/Procedures: Your physician has requested that you have an echocardiogram in 6 months. Echocardiography is a painless test that uses sound waves to create images of your heart. It provides your doctor with information about the size and shape of your heart and how well your heart's chambers and valves are working. This procedure takes approximately one hour. There are no restrictions for this procedure.  Follow-Up: Follow up in 6 months with Dr. Marlou Porch.  You will receive a letter in the mail 2 months before you are due.  Please call us when you receive this letter to schedule your follow up appointment.   If you need a refill on your cardiac medications before your next appointment, please call your pharmacy.  Thank you for choosing [facility]!!

## 2015-06-24 ENCOUNTER — Encounter: Payer: Self-pay | Admitting: Cardiology

## 2015-06-24 ENCOUNTER — Ambulatory Visit (INDEPENDENT_AMBULATORY_CARE_PROVIDER_SITE_OTHER): Payer: Medicare Other | Admitting: Cardiology

## 2015-06-24 VITALS — BP 118/70 | HR 75 | Ht 66.0 in | Wt 136.8 lb

## 2015-06-24 DIAGNOSIS — I77810 Thoracic aortic ectasia: Secondary | ICD-10-CM | POA: Diagnosis not present

## 2015-06-24 DIAGNOSIS — I481 Persistent atrial fibrillation: Secondary | ICD-10-CM

## 2015-06-24 DIAGNOSIS — I10 Essential (primary) hypertension: Secondary | ICD-10-CM | POA: Diagnosis not present

## 2015-06-24 DIAGNOSIS — I4819 Other persistent atrial fibrillation: Secondary | ICD-10-CM

## 2015-06-24 DIAGNOSIS — I350 Nonrheumatic aortic (valve) stenosis: Secondary | ICD-10-CM | POA: Diagnosis not present

## 2015-06-24 NOTE — Progress Notes (Signed)
[facility]. [address], [address], [place]  [address] Phone: [phone] Fax:  [phone]  Date:  06/24/2015   ID:  Julie Mosley, DOB 1926/07/21, MRN [id number]  PCP:  Antony Blackbird, MD   History of Present Illness: Julie Mosley is a 82 y.o. female with persistent atrial fibrillation, hypertension, prior stroke/TIA, dilated aortic root currently 4.5 cm slightly up from 4.3 cm, Moderate to severe aortic stenosis, prior bradycardia on no AV nodal blocking agents with rate-controlled atrial fibrillation, chronic anticoagulation here for followup.  No syncope.no dyspnea.  Patient denies chest pain, palpitations, (resolved) dizziness, syncope, swelling, SOB, nor PND. Walking a mile a day. She is still raking leaves.  Had a bad cold, cough. This is subsequently resolved. She had a skin lesion cutoff of her left foot. She is quite active, gardening etc.  Son at 70 had stent LAD. Had shingles twice. Left leg.  She does have chronic osteoarthritis.   Wt Readings from Last 3 Encounters:  06/24/15 136 lb 12.8 oz (62.052 kg)  12/22/14 136 lb (61.689 kg)  06/09/14 134 lb (60.782 kg)     Past Medical History  Diagnosis Date  . TIA (transient ischemic attack)   . Hypertension   . Cancer ([place])   . Chronic anticoagulation 12/13/2012    Followed by Dr. Chapman Fitch  . Mild aortic stenosis 12/13/2012  . Dilated aortic root ([place]) 12/13/2012    4.5 cm 2013.     Past Surgical History  Procedure Laterality Date  . Hip surgery    . Cesarean section    . Breast lumpectomy      bilateral  . Skin biopsy      From left foot.    Current Outpatient Prescriptions  Medication Sig Dispense Refill  . alendronate (FOSAMAX) 70 MG tablet Take 70 mg by mouth once a week. Take with a full glass of water on an empty stomach. Takes on Tuesday morning    . amLODipine (NORVASC) 5 MG tablet Take 1 tablet (5 mg total) by mouth daily. 90 tablet 3  . calcium carbonate (OS-CAL - DOSED IN MG OF ELEMENTAL  CALCIUM) 1250 MG tablet Take 1 tablet by mouth daily.    Marland Kitchen glucosamine-chondroitin 500-400 MG tablet Take 1 tablet by mouth 3 (three) times daily.    . hydrochlorothiazide (HYDRODIURIL) 25 MG tablet Take 1 tablet (25 mg total) by mouth daily. 30 tablet 11  . lisinopril (PRINIVIL,ZESTRIL) 20 MG tablet Take 20 mg by mouth 2 (two) times daily.    . Misc Natural Products (OSTEO BI-FLEX JOINT SHIELD PO) Take 1 tablet by mouth daily.     . Multiple Vitamins-Minerals (CENTRUM SILVER PO) Take 1 tablet by mouth daily.     . Omega-3 Fatty Acids (FISH OIL) 1000 MG CAPS Take 1 capsule by mouth daily.    Marland Kitchen warfarin (COUMADIN) 1 MG tablet Take 5 mg by mouth as directed. MWF,Sa 1mg  T,TH,Su take .5mg      No current facility-administered medications for this visit.    Allergies:    Allergies  Allergen Reactions  . Valium [Diazepam] Other (See Comments)    Stopped breathing    Social History:  The patient  reports that she quit smoking about 40 years ago. She has never used smokeless tobacco. She reports that she does not drink alcohol or use illicit drugs.   ROS:  Please see the history of present illness.   No bleeding, no CP, no orthopnea, no  PND    PHYSICAL EXAM: VS:  BP 118/70 mmHg  Pulse 75  Ht 5\' 6"  (1.676 m)  Wt 136 lb 12.8 oz (62.052 kg)  BMI 22.09 kg/m2 Thin, elderly, in no acute distress HEENT: normal Neck: no JVD Cardiac:  Irregularly irregular; RRR; 3/6 systolic murmur right upper sternal border Lungs:  clear to auscultation bilaterally, no wheezing, rhonchi or rales Abd: soft, nontender, no hepatomegaly Ext: no edemaPreviously had trace edema Skin: warm and dry, varicose veins feet bilaterally Neuro: no focal abnormalities noted  EKG:  EKG was ordered today. 06/24/15-atrial fibrillation heart rate 60 with rightward axis, septal infarct pattern, no other changes. Personally viewed-prior 06/09/14-atrial fibrillation heart rate 65 bpm with occasional PVCs, vertical axis. Prior  06/12/13-atrial fibrillation heart rate 58, occasional PVCs, vertical axis. Poor R wave progression.  ECHO: 06/18/14 - Left ventricle: The cavity size was normal. There wasmoderate  focal basal hypertrophy of the septum. Systolic function was  normal. The estimated ejection fraction was in the range of 50%  to 55%. Wall motion was normal; there were no regional wall  motion abnormalities. The study is not technically sufficient to  allow evaluation of LV diastolic function. - Aortic valve: Trileaflet; severely thickened, severely calcified  leaflets. Valve mobility was restricted. There was severe  stenosis. There was mild regurgitation. Mean gradient (S): 34 mm  Hg. Peak gradient (S): 54 mm Hg. - Mitral valve: Structurally normal valve. There was mild  regurgitation. - Left atrium: The atrium was severely dilated. - Right ventricle: Systolic function was moderately reduced. - Right atrium: The atrium was severely dilated. - Tricuspid valve: There was moderate-severe regurgitation. - Pulmonary arteries: PA peak pressure: 34 mm Hg (S).  Impressions:  - Normal LV size and function.  Severe aortic stenosis and mild regurgitation.  Moderate RV systolic dysfunction. RVSP 34 mmHg is most probably  underestimated secondary to RV dysfunction.  Severe biatrial dilatation.  ASSESSMENT AND PLAN:  1. Atrial fibrillation-permanent. Well rate controlled. No changes made. Doing well 2. Chronic anticoagulation-Dr. Chapman Fitch is monitoring her Coumadin. No bleeding. 3. Hypertension-well-controlled on current regimen.  4. Moderate to severe aortic stenosis-murmur appreciated.  Echocardiogram ordered, asymptomatic 5. Dilated aortic root-4.5 cm. Continue to treat blood pressure. Monitor with echo.  6. 6 month f/u  Signed, Candee Furbish, MD [facility]  06/24/2015 1:31 PM

## 2015-06-24 NOTE — Patient Instructions (Signed)
Medication Instructions:  Same-no changes  Labwork: None  Testing/Procedures: Your physician has requested that you have an echocardiogram. Echocardiography is a painless test that uses sound waves to create images of your heart. It provides your doctor with information about the size and shape of your heart and how well your heart's chambers and valves are working. This procedure takes approximately one hour. There are no restrictions for this procedure.   Follow-Up: Your physician wants you to follow-up in: 6 months. You will receive a reminder letter in the mail two months in advance. If you don't receive a letter, please call our office to schedule the follow-up appointment.     If you need a refill on your cardiac medications before your next appointment, please call your pharmacy.   

## 2015-06-26 NOTE — Addendum Note (Signed)
Addended by: Freada Bergeron on: 06/26/2015 10:11 AM   Modules accepted: Orders

## 2015-07-13 ENCOUNTER — Other Ambulatory Visit: Payer: Self-pay

## 2015-07-13 ENCOUNTER — Ambulatory Visit (HOSPITAL_COMMUNITY): Payer: Medicare Other | Attending: Internal Medicine

## 2015-07-13 DIAGNOSIS — I77810 Thoracic aortic ectasia: Secondary | ICD-10-CM | POA: Insufficient documentation

## 2015-07-13 DIAGNOSIS — I071 Rheumatic tricuspid insufficiency: Secondary | ICD-10-CM | POA: Diagnosis not present

## 2015-07-13 DIAGNOSIS — I517 Cardiomegaly: Secondary | ICD-10-CM | POA: Insufficient documentation

## 2015-07-13 DIAGNOSIS — I352 Nonrheumatic aortic (valve) stenosis with insufficiency: Secondary | ICD-10-CM | POA: Diagnosis not present

## 2015-07-13 DIAGNOSIS — I350 Nonrheumatic aortic (valve) stenosis: Secondary | ICD-10-CM | POA: Diagnosis not present

## 2015-07-13 LAB — ECHOCARDIOGRAM COMPLETE
AOPV: 0.26 m/s
AV Area VTI index: 0.45 cm2/m2
AV Area VTI: 0.81 cm2
AV Area mean vel: 0.9 cm2
AV Peak grad: 41 mmHg
AV VEL mean LVOT/AV: 0.29
AV pk vel: 322 cm/s
AVAREAMEANVIN: 0.53 cm2/m2
AVG: 23 mmHg
AVPHT: 937 ms
Ao-asc: 50 cm
CHL CUP AV PEAK INDEX: 0.48
CHL CUP AV VALUE AREA INDEX: 0.45
CHL CUP AV VEL: 0.77
CHL CUP DOP CALC LVOT VTI: 17.7 cm
DOP CAL AO MEAN VELOCITY: 223 cm/s
EWDT: 141 ms
FS: 36 % (ref 28–44)
IV/PV OW: 1.33
LA ID, A-P, ES: 48 mm
LADIAMINDEX: 2.82 cm/m2
LAVOL: 150 mL
LAVOLA4C: 141 mL
LAVOLIN: 88.2 mL/m2
LEFT ATRIUM END SYS DIAM: 48 mm
LVOT area: 3.14 cm2
LVOT diameter: 20 mm
LVOT peak grad rest: 3 mmHg
LVOTPV: 83.5 cm/s
LVOTSV: 56 mL
LVOTVTI: 0.24 cm
MV Dec: 141
MV Peak grad: 5 mmHg
MV pk E vel: 110 m/s
PW: 11.6 mm — AB (ref 0.6–1.1)
Reg peak vel: 289 cm/s
TR max vel: 289 cm/s
VTI: 72.5 cm
Valve area: 0.77 cm2

## 2015-11-27 ENCOUNTER — Other Ambulatory Visit: Payer: Self-pay | Admitting: Cardiology

## 2015-11-27 DIAGNOSIS — I10 Essential (primary) hypertension: Secondary | ICD-10-CM

## 2015-11-27 MED ORDER — AMLODIPINE BESYLATE 5 MG PO TABS: 5.0000 mg | ORAL_TABLET | Freq: Every day | ORAL | 2 refills | Status: DC

## 2015-12-18 ENCOUNTER — Other Ambulatory Visit: Payer: Self-pay | Admitting: Cardiology

## 2015-12-18 DIAGNOSIS — I10 Essential (primary) hypertension: Secondary | ICD-10-CM

## 2015-12-22 ENCOUNTER — Ambulatory Visit (INDEPENDENT_AMBULATORY_CARE_PROVIDER_SITE_OTHER): Payer: Medicare Other | Admitting: Cardiology

## 2015-12-22 ENCOUNTER — Encounter: Payer: Self-pay | Admitting: Cardiology

## 2015-12-22 VITALS — BP 138/82 | HR 68 | Ht 67.0 in | Wt 140.6 lb

## 2015-12-22 DIAGNOSIS — I77810 Thoracic aortic ectasia: Secondary | ICD-10-CM

## 2015-12-22 DIAGNOSIS — I350 Nonrheumatic aortic (valve) stenosis: Secondary | ICD-10-CM

## 2015-12-22 DIAGNOSIS — I10 Essential (primary) hypertension: Secondary | ICD-10-CM | POA: Diagnosis not present

## 2015-12-22 MED ORDER — LISINOPRIL 20 MG PO TABS: 20.0000 mg | ORAL_TABLET | Freq: Two times a day (BID) | ORAL | 3 refills | Status: DC

## 2015-12-22 MED ORDER — AMLODIPINE BESYLATE 5 MG PO TABS: 5.0000 mg | ORAL_TABLET | Freq: Every day | ORAL | 3 refills | Status: DC

## 2015-12-22 MED ORDER — HYDROCHLOROTHIAZIDE 25 MG PO TABS: 25.0000 mg | ORAL_TABLET | Freq: Every day | ORAL | 3 refills | Status: DC

## 2015-12-22 NOTE — Patient Instructions (Signed)
Medication Instructions:  The current medical regimen is effective;  continue present plan and medications.  Testing/Procedures: Your physician has requested that you have an echocardiogram (Due 01/12/16). Echocardiography is a painless test that uses sound waves to create images of your heart. It provides your doctor with information about the size and shape of your heart and how well your heart's chambers and valves are working. This procedure takes approximately one hour. There are no restrictions for this procedure.  Follow-Up: Follow up in 6 month with Dr. Marlou Porch.  You will receive a letter in the mail 2 months before you are due.  Please call us when you receive this letter to schedule your follow up appointment.   If you need a refill on your cardiac medications before your next appointment, please call your pharmacy.  Thank you for choosing [facility]!!

## 2015-12-22 NOTE — Progress Notes (Signed)
[facility]. [address], [address] [place],   [address] Phone: [phone] Fax:  [phone]  Date:  12/22/2015   ID:  Julie Mosley, DOB 1926-06-15, MRN [id number]  PCP:  Antony Blackbird, MD   History of Present Illness: Julie Mosley is a 82 y.o. female with persistent atrial fibrillation, hypertension, prior stroke/TIA, dilated aortic root currently 5cm up from 4.5 cm slightly up from 4.3 cm, Moderate to severe aortic stenosis, prior bradycardia on no AV nodal blocking agents with rate-controlled atrial fibrillation, chronic anticoagulation here for followup.  No syncope.no dyspnea. Patient denies chest pain, palpitations, syncope, swelling, SOB, nor PND. Walking a mile a day. She is still raking leaves.  Had a bad cold, cough. This is subsequently resolved. She had a skin lesion cutoff of her left foot. She is quite active, gardening etc.  Son at 28 had stent LAD. Had shingles twice. Left leg.   Wt Readings from Last 3 Encounters:  12/22/15 140 lb 9.6 oz (63.8 kg)  06/24/15 136 lb 12.8 oz (62.1 kg)  12/22/14 136 lb (61.7 kg)     Past Medical History:  Diagnosis Date  . Cancer ([place])   . Chronic anticoagulation 12/13/2012   Followed by Dr. Chapman Fitch  . Dilated aortic root ([place]) 12/13/2012   4.5 cm 2013.   Marland Kitchen Hypertension   . Mild aortic stenosis 12/13/2012  . TIA (transient ischemic attack)     Past Surgical History:  Procedure Laterality Date  . BREAST LUMPECTOMY     bilateral  . CESAREAN SECTION    . HIP SURGERY    . SKIN BIOPSY     From left foot.    Current Outpatient Prescriptions  Medication Sig Dispense Refill  . alendronate (FOSAMAX) 70 MG tablet Take 70 mg by mouth once a week. Take with a full glass of water on an empty stomach. Takes on Tuesday morning    . amLODipine (NORVASC) 5 MG tablet Take 1 tablet (5 mg total) by mouth daily. 90 tablet 3  . calcium carbonate (OS-CAL - DOSED IN MG OF ELEMENTAL CALCIUM) 1250 MG tablet Take 1 tablet by mouth daily.     Marland Kitchen glucosamine-chondroitin 500-400 MG tablet Take 1 tablet by mouth 3 (three) times daily.    . hydrochlorothiazide (HYDRODIURIL) 25 MG tablet Take 1 tablet (25 mg total) by mouth daily. 90 tablet 3  . lisinopril (PRINIVIL,ZESTRIL) 20 MG tablet Take 1 tablet (20 mg total) by mouth 2 (two) times daily. 180 tablet 3  . Misc Natural Products (OSTEO BI-FLEX JOINT SHIELD PO) Take 1 tablet by mouth daily.     . Multiple Vitamins-Minerals (CENTRUM SILVER PO) Take 1 tablet by mouth daily.     . Omega-3 Fatty Acids (FISH OIL) 1000 MG CAPS Take 1 capsule by mouth daily.    Marland Kitchen warfarin (COUMADIN) 1 MG tablet Take 5 mg by mouth as directed. MWF,Sa 1mg  T,TH,Su take .5mg      No current facility-administered medications for this visit.     Allergies:    Allergies  Allergen Reactions  . Valium [Diazepam] Other (See Comments)    Stopped breathing    Social History:  The patient  reports that she quit smoking about 40 years ago. She has never used smokeless tobacco. She reports that she does not drink alcohol or use drugs.   ROS:  Please see the history of present illness.   No bleeding, no CP, no orthopnea, no PND  PHYSICAL EXAM: VS:  BP 138/82   Pulse 68   Ht 5\' 7"  (1.702 m)   Wt 140 lb 9.6 oz (63.8 kg)   LMP  (LMP Unknown)   BMI 22.02 kg/m  Thin, elderly, in no acute distress  HEENT: normal  Neck: no JVD +bilateral carotid bruits Cardiac:  Irregularly irregular; RRR; 3/6 systolic murmur right upper sternal border Lungs:  clear to auscultation bilaterally, no wheezing, rhonchi or rales  Abd: soft, nontender, no hepatomegaly  Ext: no edema Previously had trace edema Skin: warm and dry, varicose veins feet bilaterally  Neuro: no focal abnormalities noted  EKG:  EKG was ordered today. 06/24/15-atrial fibrillation heart rate 60 with rightward axis, septal infarct pattern, no other changes. Personally viewed-prior 06/09/14-atrial fibrillation heart rate 65 bpm with occasional PVCs, vertical  axis. Prior 06/12/13-atrial fibrillation heart rate 58, occasional PVCs, vertical axis. Poor R wave progression.  ECHO: 06/18/14 - Left ventricle: The cavity size was normal. There wasmoderate  focal basal hypertrophy of the septum. Systolic function was  normal. The estimated ejection fraction was in the range of 50%  to 55%. Wall motion was normal; there were no regional wall  motion abnormalities. The study is not technically sufficient to  allow evaluation of LV diastolic function. - Aortic valve: Trileaflet; severely thickened, severely calcified  leaflets. Valve mobility was restricted. There was severe  stenosis. There was mild regurgitation. Mean gradient (S): 34 mm  Hg. Peak gradient (S): 54 mm Hg. - Mitral valve: Structurally normal valve. There was mild  regurgitation. - Left atrium: The atrium was severely dilated. - Right ventricle: Systolic function was moderately reduced. - Right atrium: The atrium was severely dilated. - Tricuspid valve: There was moderate-severe regurgitation. - Pulmonary arteries: PA peak pressure: 34 mm Hg (S).  Impressions:  - Normal LV size and function.  Severe aortic stenosis and mild regurgitation.  Moderate RV systolic dysfunction. RVSP 34 mmHg is most probably  underestimated secondary to RV dysfunction.  Severe biatrial dilatation.  Echo 07/13/15 - Left ventricle: The cavity size was normal. Wall thickness was   increased in a pattern of mild LVH. Systolic function was normal.   The estimated ejection fraction was in the range of 55% to 60%.   The study is not technically sufficient to allow evaluation of LV   diastolic function. - Aortic valve: AV is thickened, calcified, trileaflet with   restricted motion. Peak and mean gradients through the valve are   45 and 30 mm Hg respectively consistent with moderate AS 2D   imaging suggests it is more like severe. There is moderate AI. - Aorta: Aortic root is dilated at 43 mm.  Mid ascending aorta is   dilated at 50 mm. - Left atrium: The atrium was severely dilated. - Right atrium: The atrium was severely dilated. - Tricuspid valve: There was mild-moderate regurgitation.  ASSESSMENT AND PLAN:  1. Atrial fibrillation-permanent. Well rate controlled. No changes made. Doing well 2. Chronic anticoagulation-Dr. Chapman Fitch is monitoring her Coumadin. No bleeding. If she needs a lesion cut off of her foot, she may proceed. She may have to hold her Coumadin if necessary. This is fine. 3. Hypertension-well-controlled on current regimen.  4. Moderate to severe aortic stenosis-murmur appreciated.  Echocardiogram ordered, asymptomatic 5. Dilated aortic root-5 cm. this has expanded since last echocardiogram. We will closely monitor. Continue to treat blood pressure. Monitor with echo. Will repeat. 6. 6 month f/u  Signed, Candee Furbish, MD [facility]  12/22/2015 2:29 PM

## 2016-01-15 ENCOUNTER — Ambulatory Visit (HOSPITAL_COMMUNITY): Payer: Medicare Other | Attending: Cardiology

## 2016-01-15 DIAGNOSIS — I350 Nonrheumatic aortic (valve) stenosis: Secondary | ICD-10-CM | POA: Insufficient documentation

## 2016-01-15 DIAGNOSIS — I4891 Unspecified atrial fibrillation: Secondary | ICD-10-CM | POA: Diagnosis not present

## 2016-01-15 DIAGNOSIS — I77810 Thoracic aortic ectasia: Secondary | ICD-10-CM | POA: Insufficient documentation

## 2016-01-15 DIAGNOSIS — I10 Essential (primary) hypertension: Secondary | ICD-10-CM | POA: Diagnosis not present

## 2016-01-15 DIAGNOSIS — I272 Pulmonary hypertension, unspecified: Secondary | ICD-10-CM | POA: Diagnosis not present

## 2016-02-28 ENCOUNTER — Other Ambulatory Visit: Payer: Self-pay | Admitting: Cardiology

## 2016-06-21 ENCOUNTER — Encounter: Payer: Self-pay | Admitting: Cardiology

## 2016-06-21 ENCOUNTER — Encounter (INDEPENDENT_AMBULATORY_CARE_PROVIDER_SITE_OTHER): Payer: Self-pay

## 2016-06-21 ENCOUNTER — Ambulatory Visit (INDEPENDENT_AMBULATORY_CARE_PROVIDER_SITE_OTHER): Payer: Medicare Other | Admitting: Cardiology

## 2016-06-21 VITALS — BP 130/80 | HR 71 | Ht 67.0 in | Wt 137.8 lb

## 2016-06-21 DIAGNOSIS — I350 Nonrheumatic aortic (valve) stenosis: Secondary | ICD-10-CM | POA: Diagnosis not present

## 2016-06-21 DIAGNOSIS — I77810 Thoracic aortic ectasia: Secondary | ICD-10-CM | POA: Diagnosis not present

## 2016-06-21 DIAGNOSIS — I4819 Other persistent atrial fibrillation: Secondary | ICD-10-CM

## 2016-06-21 DIAGNOSIS — I481 Persistent atrial fibrillation: Secondary | ICD-10-CM

## 2016-06-21 DIAGNOSIS — I10 Essential (primary) hypertension: Secondary | ICD-10-CM | POA: Diagnosis not present

## 2016-06-21 NOTE — Patient Instructions (Signed)

## 2016-06-21 NOTE — Progress Notes (Signed)
[facility]. [address], [address] [place], [place]  [address] Phone: [phone] Fax:  [phone]  Date:  06/21/2016   ID:  Julie Mosley, DOB 1926-10-26, MRN [id number]  PCP:  Dineen Kid, MD   History of Present Illness: Julie Mosley is a 83 y.o. female with persistent atrial fibrillation, hypertension, prior stroke/TIA, dilated aortic root currently 5cm up from 4.5 cm slightly up from 4.3 cm, Moderate to severe aortic stenosis, prior bradycardia on no AV nodal blocking agents with rate-controlled atrial fibrillation, chronic anticoagulation here for followup.  No syncope.no dyspnea. Patient denies chest pain, palpitations, syncope, swelling, SOB, nor PND. Walking a mile a day. She is still raking leaves.  Had a bad cold, cough. This is subsequently resolved. She had a skin lesion cutoff of her left foot. She is quite active, gardening etc.  Son at 36 had stent LAD.  Had shingles twice. Left leg.   Wt Readings from Last 3 Encounters:  06/21/16 137 lb 12.8 oz (62.5 kg)  12/22/15 140 lb 9.6 oz (63.8 kg)  06/24/15 136 lb 12.8 oz (62.1 kg)     Past Medical History:  Diagnosis Date  . Cancer ([place])   . Chronic anticoagulation 12/13/2012   Followed by Dr. Chapman Fitch  . Dilated aortic root ([place]) 12/13/2012   4.5 cm 2013.   Marland Kitchen Hypertension   . Mild aortic stenosis 12/13/2012  . TIA (transient ischemic attack)     Past Surgical History:  Procedure Laterality Date  . BREAST LUMPECTOMY     bilateral  . CESAREAN SECTION    . HIP SURGERY    . SKIN BIOPSY     From left foot.    Current Outpatient Prescriptions  Medication Sig Dispense Refill  . alendronate (FOSAMAX) 70 MG tablet Take 70 mg by mouth once a week. Take with a full glass of water on an empty stomach. Takes on Tuesday morning    . amLODipine (NORVASC) 5 MG tablet Take 1 tablet (5 mg total) by mouth daily. 90 tablet 3  . calcium carbonate (OS-CAL - DOSED IN MG OF ELEMENTAL CALCIUM) 1250 MG tablet Take 1 tablet by mouth  daily.    Marland Kitchen glucosamine-chondroitin 500-400 MG tablet Take 1 tablet by mouth 3 (three) times daily.    . hydrochlorothiazide (HYDRODIURIL) 25 MG tablet TAKE ONE TABLET BY MOUTH ONCE DAILY 90 tablet 2  . lisinopril (PRINIVIL,ZESTRIL) 20 MG tablet Take 1 tablet (20 mg total) by mouth 2 (two) times daily. 180 tablet 3  . Misc Natural Products (OSTEO BI-FLEX JOINT SHIELD PO) Take 1 tablet by mouth daily.     . Multiple Vitamins-Minerals (CENTRUM SILVER PO) Take 1 tablet by mouth daily.     . Omega-3 Fatty Acids (FISH OIL) 1000 MG CAPS Take 1 capsule by mouth daily.    Marland Kitchen warfarin (COUMADIN) 1 MG tablet Take 4 mg by mouth as directed.      No current facility-administered medications for this visit.     Allergies:    Allergies  Allergen Reactions  . Valium [Diazepam] Other (See Comments)    Stopped breathing    Social History:  The patient  reports that she quit smoking about 41 years ago. She has never used smokeless tobacco. She reports that she does not drink alcohol or use drugs.   ROS:  Please see the history of present illness.   No bleeding, no CP, no orthopnea, no PND    PHYSICAL EXAM: VS:  BP 130/80   Pulse 71   Ht 5\' 7"  (1.702 m)   Wt 137 lb 12.8 oz (62.5 kg)   LMP  (LMP Unknown)   BMI 21.58 kg/m  Thin, elderly, in no acute distress  HEENT: normal  Neck: no JVD positive radiation of carotid/aortic murmur Cardiac: Normal rate, irregularly irregular with 3/6 crescendo decrescendo murmur right upper sternal border Lungs: Clear auscultation bilaterally, no wheezing, rhonchi or rales  Abd: soft, nontender, no hepatomegaly  Ext: No significant edema Skin: warm and dry, varicose veins feet bilaterally  Neuro: no focal abnormalities noted  EKG:  EKG was ordered today. 06/21/16-atrial fibrillation 71 bpm with septal infarct pattern personally viewed -prior 06/24/15-atrial fibrillation heart rate 60 with rightward axis, septal infarct pattern, no other changes. Personally  viewed-prior 06/09/14-atrial fibrillation heart rate 65 bpm with occasional PVCs, vertical axis. Prior 06/12/13-atrial fibrillation heart rate 58, occasional PVCs, vertical axis. Poor R wave progression.  ECHO: 06/18/14 - Left ventricle: The cavity size was normal. There wasmoderate  focal basal hypertrophy of the septum. Systolic function was  normal. The estimated ejection fraction was in the range of 50%  to 55%. Wall motion was normal; there were no regional wall  motion abnormalities. The study is not technically sufficient to  allow evaluation of LV diastolic function. - Aortic valve: Trileaflet; severely thickened, severely calcified  leaflets. Valve mobility was restricted. There was severe  stenosis. There was mild regurgitation. Mean gradient (S): 34 mm  Hg. Peak gradient (S): 54 mm Hg. - Mitral valve: Structurally normal valve. There was mild  regurgitation. - Left atrium: The atrium was severely dilated. - Right ventricle: Systolic function was moderately reduced. - Right atrium: The atrium was severely dilated. - Tricuspid valve: There was moderate-severe regurgitation. - Pulmonary arteries: PA peak pressure: 34 mm Hg (S).  Impressions:  - Normal LV size and function.  Severe aortic stenosis and mild regurgitation.  Moderate RV systolic dysfunction. RVSP 34 mmHg is most probably  underestimated secondary to RV dysfunction.  Severe biatrial dilatation.  Echo12/22/17   Moderate to Severe aortic stenosis and moderate regurgitation. (Mean gradient 6mmHg - remains moderate). LVEF is decreasing from 55-60% to 40-45%. There is severe pulmonary hypertension. Aortic root remains 43mm. Unchanged No changes to medication. Continue to monitor clinically.    ASSESSMENT AND PLAN:  Permanent atrial fibrillation  - No changes, doing well. Good rate control on no specific medications for rate control.  Chronic anticoagulation  - Encouraged her to call Dr.  Lynelle Doctor to get in for a Coumadin check. No bleeding. She recently changed her dosage to 4 mg.  Essential hypertension  - Currently 130/80. No changes made. Continue with amlodipine hydrochlorothiazide and lisinopril. If blood pressure decreases, agree with discontinuation of amlodipine.  Dilated aortic root  - 5 cm. Stable. No changes.  Moderate to severe aortic stenosis  - Asymptomatic. Echocardiogram reviewed as above. At next clinic visit we'll likely repeat echocardiogram.  6  month f/u  Signed, Candee Furbish, MD [facility]  06/21/2016 11:38 AM

## 2016-11-03 ENCOUNTER — Telehealth: Payer: Self-pay | Admitting: Cardiology

## 2016-11-03 NOTE — Telephone Encounter (Signed)
New message    Pt c/o of Chest Pain: STAT if CP now or developed within 24 hours  1. Are you having CP right now? Not this minute  2. Are you experiencing any other symptoms (ex. SOB, nausea, vomiting, sweating)? Lightheaded, not feeling well  3. How long have you been experiencing CP? Started today   4. Is your CP continuous or coming and going? Coming and going  5. Have you taken Nitroglycerin? No  ?

## 2016-11-03 NOTE — Telephone Encounter (Signed)
Called patient back about her message. Patient complained of having chest pain off and on today. Patient stated she does not have any chest pain right now. Patient stated she was just sitting around when chest pain came. Patient does not have any nitro to take. Patient does not have any other symptoms. Patient has no way to take her BP or HR. Informed patient that she should go to the ED if her chest pain comes back. Made patient an appointment at [facility] office for patient to be seen by the DOD tomorrow to be evaluated. Patient verbalized understanding and agree to plan.

## 2016-11-04 ENCOUNTER — Ambulatory Visit (INDEPENDENT_AMBULATORY_CARE_PROVIDER_SITE_OTHER): Payer: Medicare Other | Admitting: Cardiology

## 2016-11-04 ENCOUNTER — Encounter: Payer: Self-pay | Admitting: Cardiology

## 2016-11-04 VITALS — BP 130/82 | Ht 67.0 in | Wt 137.4 lb

## 2016-11-04 DIAGNOSIS — I481 Persistent atrial fibrillation: Secondary | ICD-10-CM | POA: Diagnosis not present

## 2016-11-04 DIAGNOSIS — I350 Nonrheumatic aortic (valve) stenosis: Secondary | ICD-10-CM | POA: Diagnosis not present

## 2016-11-04 DIAGNOSIS — I712 Thoracic aortic aneurysm, without rupture, unspecified: Secondary | ICD-10-CM

## 2016-11-04 DIAGNOSIS — I10 Essential (primary) hypertension: Secondary | ICD-10-CM

## 2016-11-04 DIAGNOSIS — I4819 Other persistent atrial fibrillation: Secondary | ICD-10-CM

## 2016-11-04 DIAGNOSIS — R072 Precordial pain: Secondary | ICD-10-CM | POA: Diagnosis not present

## 2016-11-04 NOTE — Patient Instructions (Signed)
We will schedule you for an Echocardiogram   Keep your appointment with Dr. Marlou Porch in November

## 2016-11-04 NOTE — Progress Notes (Signed)
[facility]. [address] [place], [place]  [address] Phone: [phone] Fax:  [phone]  Date:  11/04/2016   ID:  Julie, Mosley 02-13-1926, MRN [id number]  PCP:  Dineen Kid, MD  Cardiologist: Candee Furbish MD  History of Present Illness: Julie Mosley is a 83 y.o. female seen as a work in for evaluation of chest pain. She has a history of  persistent atrial fibrillation, hypertension, prior stroke/TIA, dilated aortic root currently 5cm, Moderate to severe aortic stenosis, prior bradycardia on no AV nodal blocking agents with rate-controlled atrial fibrillation, on chronic anticoagulation.   Patient states she was having chest pain yesterday. Noted it was a sharp, shooting pain in the left precordial area. Momentary lasting a few seconds. No radiation. No relation to activity. Never had this pain before. States last year she had some left shoulder an arm pain with exertion but this resolved. She does note she has more dyspnea on exertion now and as a result she doesn't walk as much. She still lives alone and cares for her house and does a little gardening. Still drives and does her grocery shopping.     Wt Readings from Last 3 Encounters:  11/04/16 137 lb 6.4 oz (62.3 kg)  06/21/16 137 lb 12.8 oz (62.5 kg)  12/22/15 140 lb 9.6 oz (63.8 kg)     Past Medical History:  Diagnosis Date  . Cancer ([facility])   . Chronic anticoagulation 12/13/2012   Followed by Dr. Chapman Fitch  . Dilated aortic root ([place]) 12/13/2012   4.5 cm 2013.   Marland Kitchen Hypertension   . Mild aortic stenosis 12/13/2012  . TIA (transient ischemic attack)     Past Surgical History:  Procedure Laterality Date  . BREAST LUMPECTOMY     bilateral  . CESAREAN SECTION    . HIP SURGERY    . SKIN BIOPSY     From left foot.    Current Outpatient Prescriptions  Medication Sig Dispense Refill  . alendronate (FOSAMAX) 70 MG tablet Take 70 mg by mouth once a week. Take with a full glass of water on an empty stomach. Takes  on Tuesday morning    . amLODipine (NORVASC) 5 MG tablet Take 1 tablet (5 mg total) by mouth daily. 90 tablet 3  . calcium carbonate (OS-CAL - DOSED IN MG OF ELEMENTAL CALCIUM) 1250 MG tablet Take 1 tablet by mouth daily.    Marland Kitchen glucosamine-chondroitin 500-400 MG tablet Take 1 tablet by mouth 3 (three) times daily.    . hydrochlorothiazide (HYDRODIURIL) 25 MG tablet TAKE ONE TABLET BY MOUTH ONCE DAILY 90 tablet 2  . lisinopril (PRINIVIL,ZESTRIL) 20 MG tablet Take 1 tablet (20 mg total) by mouth 2 (two) times daily. 180 tablet 3  . Misc Natural Products (OSTEO BI-FLEX JOINT SHIELD PO) Take 1 tablet by mouth daily.     . Multiple Vitamins-Minerals (CENTRUM SILVER PO) Take 1 tablet by mouth daily.     . Omega-3 Fatty Acids (FISH OIL) 1000 MG CAPS Take 1 capsule by mouth daily.    Marland Kitchen warfarin (COUMADIN) 1 MG tablet Take 4 mg by mouth as directed.      No current facility-administered medications for this visit.     Allergies:    Allergies  Allergen Reactions  . Valium [Diazepam] Other (See Comments)    Stopped breathing    Social History:  The patient  reports that she quit smoking about 41 years ago. She has never  used smokeless tobacco. She reports that she does not drink alcohol or use drugs.   Family History  Problem Relation Age of Onset  . Heart attack Mother   . Heart attack Sister   . Heart attack Brother     ROS:  Please see the history of present illness.   No bleeding, no CP, no orthopnea, no PND    PHYSICAL EXAM: VS:  BP 130/82   Ht 5\' 7"  (1.702 m)   Wt 137 lb 6.4 oz (62.3 kg)   LMP  (LMP Unknown)   BMI 21.52 kg/m  GENERAL:  Well appearing, elderly WF in NAD HEENT:  PERRL, EOMI, sclera are clear. Oropharynx is clear. NECK:  No jugular venous distention, carotid upstroke normal, no bruits, no thyromegaly or adenopathy LUNGS:  Clear to auscultation bilaterally CHEST:  Unremarkable HEART:  IRRR,  Grade 2/6 harsh systolic crescendo/decrescendo murmur RUSB radiating to  carotids. Blowing holosystolic murmur at the apex. PMI not displaced or sustained,S1 and S2 within normal limits, no S3, no S4: no clicks, no rubs, no murmurs ABD:  Soft, nontender. BS +, no masses or bruits. No hepatomegaly, no splenomegaly EXT:  2 + pulses throughout, no edema, no cyanosis no clubbing SKIN:  Warm and dry.  No rashes NEURO:  Alert and oriented x 3. Cranial nerves II through XII intact. PSYCH:  Cognitively intact  EKG:  EKG was ordered today.Afib  rate 76, LAD, LBBB.I have personally reviewed and interpreted this study.   Echo12/22/17   Moderate to Severe aortic stenosis and moderate regurgitation. (Mean gradient 60mmHg - remains moderate). LVEF is decreasing from 55-60% to 40-45%. There is severe pulmonary hypertension. Aortic root remains 50mm. Unchanged    ASSESSMENT AND PLAN:  1. Chest pain. Symptoms are atypical for angina. she does have progressive DOE more concerning for progression of her AS. Other potential cardiac etiologies of chest pain include ischemia, thoracic aneurysm, or pulmonary HTN. Will update Echo at this time. patient to keep follow up appointment with Dr Marlou Porch in November. If pain worsens she is to contact us or go to ED.   2. Permanent atrial fibrillation- rate is well controlled and she is anticoagulated with coumadin. On no rate slowing therapy.  3. Aortic stenosis. I am concerned that her AS has progressed from moderate to severe. She does have progressive DOE. Will update Echo. Would need to consider if she would be a candidate for AVR. I doubt she would be a TAVR candidate with thoracic aneurysm.   4. Thoracic aortic aneurysm. Last measured 5.0 cm by Echo. Would consider CT at some point.   5. Essential hypertension  - well controlled today  6. Pulmonary HTN. Probably related to AS and diastolic dysfunction.   We will order routing labs and Echo. Follow up with Dr. Marlou Porch in November.   Signed,  [place] MD,  [facility]  11/04/2016 3:22 PM

## 2016-11-07 ENCOUNTER — Other Ambulatory Visit: Payer: Self-pay | Admitting: Cardiology

## 2016-11-21 ENCOUNTER — Other Ambulatory Visit: Payer: Medicare Other

## 2016-11-21 ENCOUNTER — Other Ambulatory Visit: Payer: Self-pay

## 2016-11-21 ENCOUNTER — Ambulatory Visit (HOSPITAL_COMMUNITY): Payer: Medicare Other | Attending: Cardiology

## 2016-11-21 DIAGNOSIS — I350 Nonrheumatic aortic (valve) stenosis: Secondary | ICD-10-CM

## 2016-11-21 DIAGNOSIS — I083 Combined rheumatic disorders of mitral, aortic and tricuspid valves: Secondary | ICD-10-CM | POA: Diagnosis not present

## 2016-11-21 DIAGNOSIS — I719 Aortic aneurysm of unspecified site, without rupture: Secondary | ICD-10-CM | POA: Diagnosis not present

## 2016-11-21 DIAGNOSIS — R079 Chest pain, unspecified: Secondary | ICD-10-CM | POA: Diagnosis not present

## 2016-11-21 DIAGNOSIS — R072 Precordial pain: Secondary | ICD-10-CM | POA: Diagnosis not present

## 2016-11-21 DIAGNOSIS — I4819 Other persistent atrial fibrillation: Secondary | ICD-10-CM

## 2016-11-21 DIAGNOSIS — I4891 Unspecified atrial fibrillation: Secondary | ICD-10-CM | POA: Insufficient documentation

## 2016-11-21 DIAGNOSIS — I712 Thoracic aortic aneurysm, without rupture, unspecified: Secondary | ICD-10-CM

## 2016-11-21 DIAGNOSIS — Z8673 Personal history of transient ischemic attack (TIA), and cerebral infarction without residual deficits: Secondary | ICD-10-CM | POA: Diagnosis not present

## 2016-11-21 DIAGNOSIS — I10 Essential (primary) hypertension: Secondary | ICD-10-CM | POA: Insufficient documentation

## 2016-11-21 DIAGNOSIS — I481 Persistent atrial fibrillation: Secondary | ICD-10-CM

## 2016-11-21 LAB — ECHOCARDIOGRAM COMPLETE
AOPV: 0.26 m/s
AV Area VTI index: 0.52 cm2/m2
AV Area VTI: 0.81 cm2
AV Mean grad: 27 mmHg
AV Peak grad: 57 mmHg
AV VEL mean LVOT/AV: 0.31
AV pk vel: 379 cm/s
AV vel: 0.89
AVAREAMEANV: 0.98 cm2
AVAREAMEANVIN: 0.57 cm2/m2
Area-P 1/2: 3.24 cm2
CHL CUP AV PEAK INDEX: 0.47
CHL CUP RV SYS PRESS: 48 mmHg
CHL CUP TV REG PEAK VELOCITY: 337 cm/s
DOP CAL AO MEAN VELOCITY: 239 cm/s
E decel time: 232 msec
E/e' ratio: 10.74
FS: 38 % (ref 28–44)
IVS/LV PW RATIO, ED: 0.93
LA ID, A-P, ES: 47 mm
LA diam end sys: 47 mm
LA vol index: 76.3 mL/m2
LA vol: 131 mL
LADIAMINDEX: 2.74 cm/m2
LAVOLA4C: 106 mL
LDCA: 3.14 cm2
LV E/e' medial: 10.74
LV E/e'average: 10.74
LV PW d: 15 mm — AB (ref 0.6–1.1)
LV TDI E'LATERAL: 8.81
LV TDI E'MEDIAL: 4.68
LVELAT: 8.81 cm/s
LVOT SV: 70 mL
LVOT VTI: 22.3 cm
LVOT diameter: 20 mm
LVOTPV: 97.6 cm/s
LVOTVTI: 0.28 cm
Lateral S' vel: 9.79 cm/s
MV Dec: 232
MV VTI: 184 cm
MVPG: 4 mmHg
MVPKEVEL: 94.6 m/s
P 1/2 time: 566 ms
P 1/2 time: 68 ms
PISA EROA: 0.13 cm2
Peak grad: 337 mmHg
RV TAPSE: 14.2 mm
TRMAXVEL: 337 cm/s
VTI: 78.9 cm
Valve area index: 0.52
Valve area: 0.89 cm2

## 2016-11-22 LAB — CBC WITH DIFFERENTIAL/PLATELET
BASOS: 1 %
Basophils Absolute: 0 10*3/uL (ref 0.0–0.2)
EOS (ABSOLUTE): 0.1 10*3/uL (ref 0.0–0.4)
EOS: 2 %
HEMATOCRIT: 42.1 % (ref 34.0–46.6)
HEMOGLOBIN: 14 g/dL (ref 11.1–15.9)
IMMATURE GRANS (ABS): 0 10*3/uL (ref 0.0–0.1)
IMMATURE GRANULOCYTES: 0 %
LYMPHS: 31 %
Lymphocytes Absolute: 1.5 10*3/uL (ref 0.7–3.1)
MCH: 31.5 pg (ref 26.6–33.0)
MCHC: 33.3 g/dL (ref 31.5–35.7)
MCV: 95 fL (ref 79–97)
Monocytes Absolute: 0.4 10*3/uL (ref 0.1–0.9)
Monocytes: 9 %
NEUTROS ABS: 2.9 10*3/uL (ref 1.4–7.0)
Neutrophils: 57 %
PLATELETS: 134 10*3/uL — AB (ref 150–379)
RBC: 4.44 x10E6/uL (ref 3.77–5.28)
RDW: 15.5 % — ABNORMAL HIGH (ref 12.3–15.4)
WBC: 4.9 10*3/uL (ref 3.4–10.8)

## 2016-11-22 LAB — COMPREHENSIVE METABOLIC PANEL
A/G RATIO: 1.8 (ref 1.2–2.2)
ALT: 19 IU/L (ref 0–32)
AST: 36 IU/L (ref 0–40)
Albumin: 4.1 g/dL (ref 3.5–4.7)
Alkaline Phosphatase: 49 IU/L (ref 39–117)
BUN/Creatinine Ratio: 22 (ref 12–28)
BUN: 19 mg/dL (ref 8–27)
Bilirubin Total: 0.5 mg/dL (ref 0.0–1.2)
CALCIUM: 9.8 mg/dL (ref 8.7–10.3)
CO2: 27 mmol/L (ref 20–29)
CREATININE: 0.86 mg/dL (ref 0.57–1.00)
Chloride: 101 mmol/L (ref 96–106)
GFR, EST AFRICAN AMERICAN: 70 mL/min/{1.73_m2} (ref >59)
GFR, EST NON AFRICAN AMERICAN: 61 mL/min/{1.73_m2} (ref >59)
GLOBULIN, TOTAL: 2.3 g/dL (ref 1.5–4.5)
Glucose: 121 mg/dL — ABNORMAL HIGH (ref 65–99)
POTASSIUM: 4 mmol/L (ref 3.5–5.2)
Sodium: 144 mmol/L (ref 134–144)
TOTAL PROTEIN: 6.4 g/dL (ref 6.0–8.5)

## 2016-11-22 LAB — TSH: TSH: 8.38 u[IU]/mL — ABNORMAL HIGH (ref 0.450–4.500)

## 2016-12-09 ENCOUNTER — Encounter: Payer: Self-pay | Admitting: Cardiology

## 2016-12-21 ENCOUNTER — Encounter: Payer: Self-pay | Admitting: Cardiology

## 2016-12-21 ENCOUNTER — Ambulatory Visit: Payer: Medicare Other | Admitting: Cardiology

## 2016-12-21 VITALS — BP 130/64 | HR 60 | Ht 67.0 in | Wt 139.1 lb

## 2016-12-21 DIAGNOSIS — I77810 Thoracic aortic ectasia: Secondary | ICD-10-CM | POA: Diagnosis not present

## 2016-12-21 DIAGNOSIS — I350 Nonrheumatic aortic (valve) stenosis: Secondary | ICD-10-CM | POA: Diagnosis not present

## 2016-12-21 DIAGNOSIS — I712 Thoracic aortic aneurysm, without rupture, unspecified: Secondary | ICD-10-CM

## 2016-12-21 NOTE — Patient Instructions (Signed)
Medication Instructions:  The current medical regimen is effective;  continue present plan and medications.  Testing/Procedures: Your physician has requested that you have CT of your chest to evaluate your aortic root. Cardiac computed tomography (CT) is a painless test that uses an x-ray machine to take clear, detailed pictures of your heart.   Follow-Up: Follow up in 6 months with Dr. Marlou Porch.  You will receive a letter in the mail 2 months before you are due.  Please call us when you receive this letter to schedule your follow up appointment.  If you need a refill on your cardiac medications before your next appointment, please call your pharmacy.  Thank you for choosing [facility]!!

## 2016-12-21 NOTE — Progress Notes (Signed)
[facility]. [address], [address] [place], [place]  [address] Phone: [phone] Fax:  [phone]  Date:  12/21/2016   ID:  Julie Mosley, DOB October 23, 1926, MRN [id number]  PCP:  Dineen Kid, MD   History of Present Illness: Julie Mosley is a 83 y.o. female with persistent atrial fibrillation, hypertension, prior stroke/TIA, dilated aortic root currently 5cm up from 4.5 cm slightly up from 4.3 cm, Moderate to severe aortic stenosis, prior bradycardia on no AV nodal blocking agents with rate-controlled atrial fibrillation, chronic anticoagulation here for followup.  Walking a mile a day. She is still raking leaves.  Had a bad cold, cough. This is subsequently resolved. She had a skin lesion cutoff of her left foot. She is quite active, gardening etc.  Son at 11 had stent LAD.  Had shingles twice. Left leg.   12/21/16-she was last seen approximately 1 month ago by Dr. Martinique after having chest discomfort.  She noted sharp shooting pain in the left precordial area lasting a few seconds duration.  Never had this type of pain before.  Symptoms were atypical.  Echocardiogram was updated. Moderate AS. Likely MSK, was cutting Irises in garden.    Wt Readings from Last 3 Encounters:  12/21/16 139 lb 1.9 oz (63.1 kg)  11/04/16 137 lb 6.4 oz (62.3 kg)  06/21/16 137 lb 12.8 oz (62.5 kg)     Past Medical History:  Diagnosis Date  . Cancer ([facility])   . Chronic anticoagulation 12/13/2012   Followed by Dr. Chapman Fitch  . Dilated aortic root ([facility]) 12/13/2012   4.5 cm 2013.   Marland Kitchen Hypertension   . Mild aortic stenosis 12/13/2012  . TIA (transient ischemic attack)     Past Surgical History:  Procedure Laterality Date  . BREAST LUMPECTOMY     bilateral  . CESAREAN SECTION    . HIP SURGERY    . SKIN BIOPSY     From left foot.    Current Outpatient Medications  Medication Sig Dispense Refill  . alendronate (FOSAMAX) 70 MG tablet Take 70 mg by mouth once a week. Take with a full glass of water  on an empty stomach. Takes on Tuesday morning    . amLODipine (NORVASC) 5 MG tablet Take 1 tablet (5 mg total) by mouth daily. 90 tablet 3  . calcium carbonate (OS-CAL - DOSED IN MG OF ELEMENTAL CALCIUM) 1250 MG tablet Take 1 tablet by mouth daily.    Marland Kitchen glucosamine-chondroitin 500-400 MG tablet Take 1 tablet by mouth 3 (three) times daily.    . hydrochlorothiazide (HYDRODIURIL) 25 MG tablet TAKE ONE TABLET BY MOUTH ONCE DAILY 90 tablet 2  . lisinopril (PRINIVIL,ZESTRIL) 20 MG tablet TAKE 1 TABLET BY MOUTH TWO  TIMES DAILY 180 tablet 3  . Misc Natural Products (OSTEO BI-FLEX JOINT SHIELD PO) Take 1 tablet by mouth daily.     . Multiple Vitamins-Minerals (CENTRUM SILVER PO) Take 1 tablet by mouth daily.     . Omega-3 Fatty Acids (FISH OIL) 1000 MG CAPS Take 1 capsule by mouth daily.    Marland Kitchen warfarin (COUMADIN) 1 MG tablet Take 4 mg by mouth as directed.      No current facility-administered medications for this visit.     Allergies:    Allergies  Allergen Reactions  . Valium [Diazepam] Other (See Comments)    Stopped breathing    Social History:  The patient  reports that she quit smoking about 41 years ago. she  has never used smokeless tobacco. She reports that she does not drink alcohol or use drugs.   ROS:  Please see the history of present illness.   No bleeding, no CP, no orthopnea, no PND    PHYSICAL EXAM: VS:  BP 130/64   Pulse 60   Ht 5\' 7"  (1.702 m)   Wt 139 lb 1.9 oz (63.1 kg)   LMP  (LMP Unknown)   SpO2 90%   BMI 21.79 kg/m  Thin, elderly, in no acute distress  HEENT: normal  Neck: no JVD positive radiation of carotid/aortic murmur Cardiac: Normal rate, irregularly irregular with 3/6 crescendo decrescendo murmur right upper sternal border Lungs: Clear auscultation bilaterally, no wheezing, rhonchi or rales  Abd: soft, nontender, no hepatomegaly  Ext: No significant edema Skin: warm and dry, varicose veins feet bilaterally  Neuro: no focal abnormalities noted  EKG:   EKG was ordered today. 06/21/16-atrial fibrillation 71 bpm with septal infarct pattern personally viewed -prior 06/24/15-atrial fibrillation heart rate 60 with rightward axis, septal infarct pattern, no other changes. Personally viewed-prior 06/09/14-atrial fibrillation heart rate 65 bpm with occasional PVCs, vertical axis. Prior 06/12/13-atrial fibrillation heart rate 58, occasional PVCs, vertical axis. Poor R wave progression.  ECHO: 11/21/16: - Left ventricle: The cavity size was normal. There was mild focal   basal hypertrophy of the septum. Systolic function was normal.   The estimated ejection fraction was in the range of 55% to 60%.   Wall motion was normal; there were no regional wall motion   abnormalities. Doppler parameters are consistent with high   ventricular filling pressure. - Aortic valve: Valve mobility was restricted. There was moderate   stenosis. There was mild regurgitation. - Aortic root: The aortic root was mildly dilated. - Mitral valve: Calcified annulus. Mildly thickened leaflets .   There was mild regurgitation. - Left atrium: The atrium was severely dilated. - Right atrium: The atrium was severely dilated. - Tricuspid valve: There was mild-moderate regurgitation. - Pulmonary arteries: Systolic pressure was mildly increased. PA   peak pressure: 48 mm Hg (S). - Pericardium, extracardiac: A trivial pericardial effusion was   identified.  Impressions:  - Normal LV systolic function; elevated LV filling pressure; mildly   dilated aortic root (4.5 cm); suggest CTA to further assess;   thickened aortic valve with moderate AS (mean gradient 27 mmHg)   and mild AI; mild MR; severe biatrial enlargement; mild to   moderate TR with mildly elevated pulmonary pressure.   ASSESSMENT AND PLAN:  Atypical chest pain -No further recurrences, likely musculoskeletal previously when she was working in her garden.  Permanent atrial fibrillation  - No changes, doing well. Good  rate control on no specific medications for rate control.  No changes made.  Chronic anticoagulation  - Encouraged her to call Dr. Lynelle Doctor to get in for a Coumadin check. No bleeding. She recently changed her dosage to 4 mg.  No bleeding.  Essential hypertension  - Currently 130/80. No changes made. Continue with amlodipine hydrochlorothiazide and lisinopril. If blood pressure decreases, agree with discontinuation of amlodipine.  Dilated aortic root  - 4.5-5 cm. Stable. No changes.  We will check a CT scan to get an accurate measurement of her aortic diameter.  Moderate to severe aortic stenosis  - Asymptomatic. Echocardiogram reviewed as above.  Repeat fairly stable.  6  month f/u  Signed, Candee Furbish, MD [facility]  12/21/2016 11:16 AM

## 2016-12-23 ENCOUNTER — Ambulatory Visit (INDEPENDENT_AMBULATORY_CARE_PROVIDER_SITE_OTHER)
Admission: RE | Admit: 2016-12-23 | Discharge: 2016-12-23 | Disposition: A | Discharge status: Home / Self Care | Payer: Medicare Other | Source: Ambulatory Visit | Attending: Cardiology | Admitting: Cardiology

## 2016-12-23 DIAGNOSIS — I77810 Thoracic aortic ectasia: Secondary | ICD-10-CM | POA: Diagnosis not present

## 2016-12-23 MED ORDER — IOPAMIDOL (ISOVUE-370) INJECTION 76%
100.0000 mL | Freq: Once | INTRAVENOUS | Status: AC | PRN
  Administered 2016-12-23: 100 mL via INTRAVENOUS

## 2016-12-27 ENCOUNTER — Telehealth: Payer: Self-pay | Admitting: Cardiology

## 2016-12-27 NOTE — Telephone Encounter (Signed)
Notes recorded by Jerline Pain, MD on 12/24/2016 at 3:28 PM EST Stable dilated aortic aorta (4.9cm at greatest diameter).  We will encourage repeat CT in at least one year.  Candee Furbish, MD  Pt aware and had no further questions.

## 2016-12-27 NOTE — Telephone Encounter (Signed)
New Message  Pt call requesting to speak with RN about getting CT results. Please call back to discuss

## 2017-01-23 ENCOUNTER — Other Ambulatory Visit: Payer: Self-pay | Admitting: Cardiology

## 2017-01-23 DIAGNOSIS — I10 Essential (primary) hypertension: Secondary | ICD-10-CM

## 2017-06-12 ENCOUNTER — Encounter (INDEPENDENT_AMBULATORY_CARE_PROVIDER_SITE_OTHER): Payer: Self-pay

## 2017-06-12 ENCOUNTER — Encounter: Payer: Self-pay | Admitting: Cardiology

## 2017-06-12 ENCOUNTER — Ambulatory Visit: Payer: Medicare Other | Admitting: Cardiology

## 2017-06-12 VITALS — BP 144/72 | HR 74 | Ht 67.0 in | Wt 132.0 lb

## 2017-06-12 DIAGNOSIS — I350 Nonrheumatic aortic (valve) stenosis: Secondary | ICD-10-CM | POA: Diagnosis not present

## 2017-06-12 DIAGNOSIS — I712 Thoracic aortic aneurysm, without rupture, unspecified: Secondary | ICD-10-CM

## 2017-06-12 DIAGNOSIS — I481 Persistent atrial fibrillation: Secondary | ICD-10-CM

## 2017-06-12 DIAGNOSIS — I4819 Other persistent atrial fibrillation: Secondary | ICD-10-CM

## 2017-06-12 NOTE — Patient Instructions (Signed)
Medication Instructions:  The current medical regimen is effective;  continue present plan and medications.  Testing/Procedures: Your physician has requested that you have cardiac CT. Cardiac computed tomography (CT) is a painless test that uses an x-ray machine to take clear, detailed pictures of your heart. For further information please visit [facility]. Please follow instruction sheet as given.  Follow-Up: Follow up in 12/2017 with Dr Marlou Porch after your CT scan.  If you need a refill on your cardiac medications before your next appointment, please call your pharmacy.  Thank you for choosing [facility]!!

## 2017-06-12 NOTE — Progress Notes (Signed)
[facility]. [address], [address] [place], [place]  [address] Phone: [phone] Fax:  [phone]  Date:  06/12/2017   ID:  Julie Mosley, DOB Mar 21, 1926, MRN [id number]  PCP:  Dineen Kid, MD   History of Present Illness: Julie Mosley is a 84 y.o. female with persistent atrial fibrillation, hypertension, prior stroke/TIA, dilated aortic root currently 5cm up from 4.5 cm slightly up from 4.3 cm, Moderate to severe aortic stenosis, prior bradycardia on no AV nodal blocking agents with rate-controlled atrial fibrillation, chronic anticoagulation here for followup.  Walking still a mile a day. She is still raking leaves.  Son at 25 had stent LAD.  Had shingles twice. Left leg.   12/21/16-she was last seen approximately 1 month ago by Dr. Martinique after having chest discomfort.  She noted sharp shooting pain in the left precordial area lasting a few seconds duration.  Never had this type of pain before.  Symptoms were atypical.  Echocardiogram was updated. Moderate AS. Likely MSK, was cutting [place] in garden.   06/12/2017-overall doing fairly well.  Main complaints are back pain easy bruising hearing loss.  Continues to lose some weight.  We discussed her ascending root aortic aneurysm.  Risks and benefits of potential surgery.  She seems very comfortable with the fact that this may be a potential cause for her death.  She states that she is ready and she wants to go quickly.   Wt Readings from Last 3 Encounters:  06/12/17 132 lb (59.9 kg)  12/21/16 139 lb 1.9 oz (63.1 kg)  11/04/16 137 lb 6.4 oz (62.3 kg)     Past Medical History:  Diagnosis Date  . Cancer ([facility])   . Chronic anticoagulation 12/13/2012   Followed by Dr. Chapman Fitch  . Dilated aortic root ([facility]) 12/13/2012   4.5 cm 2013.   Marland Kitchen Hypertension   . Mild aortic stenosis 12/13/2012  . TIA (transient ischemic attack)     Past Surgical History:  Procedure Laterality Date  . BREAST LUMPECTOMY     bilateral  . CESAREAN SECTION     . HIP SURGERY    . SKIN BIOPSY     From left foot.    Current Outpatient Medications  Medication Sig Dispense Refill  . alendronate (FOSAMAX) 70 MG tablet Take 70 mg by mouth once a week. Take with a full glass of water on an empty stomach. Takes on Tuesday morning    . amLODipine (NORVASC) 5 MG tablet TAKE 1 TABLET BY MOUTH  DAILY 90 tablet 3  . calcium carbonate (OS-CAL - DOSED IN MG OF ELEMENTAL CALCIUM) 1250 MG tablet Take 1 tablet by mouth daily.    Marland Kitchen glucosamine-chondroitin 500-400 MG tablet Take 1 tablet by mouth 3 (three) times daily.    . hydrochlorothiazide (HYDRODIURIL) 25 MG tablet TAKE ONE TABLET BY MOUTH ONCE DAILY 90 tablet 2  . lisinopril (PRINIVIL,ZESTRIL) 20 MG tablet TAKE 1 TABLET BY MOUTH TWO  TIMES DAILY 180 tablet 3  . Misc Natural Products (OSTEO BI-FLEX JOINT SHIELD PO) Take 1 tablet by mouth daily.     . Multiple Vitamins-Minerals (CENTRUM SILVER PO) Take 1 tablet by mouth daily.     . Omega-3 Fatty Acids (FISH OIL) 1000 MG CAPS Take 1 capsule by mouth daily.    Marland Kitchen warfarin (COUMADIN) 1 MG tablet Take 4 mg by mouth as directed.      No current facility-administered medications for this visit.     Allergies:  Allergies  Allergen Reactions  . Valium [Diazepam] Other (See Comments)    Stopped breathing    Social History:  The patient  reports that she quit smoking about 42 years ago. She has never used smokeless tobacco. She reports that she does not drink alcohol or use drugs.   ROS:  Please see the history of present illness.   No bleeding, no CP, no orthopnea, no PND    PHYSICAL EXAM: VS:  BP (!) 144/72   Pulse 74   Ht 5\' 7"  (1.702 m)   Wt 132 lb (59.9 kg)   LMP  (LMP Unknown)   BMI 20.67 kg/m  GEN: Thin in no acute distress  HEENT: normal  Neck: no JVD, carotid bruits, or masses Cardiac: RRR; 3/6 systolic murmur, right upper sternal border, no rubs, or gallops,no edema  Respiratory:  clear to auscultation bilaterally, normal work of  breathing GI: soft, nontender, nondistended, + BS MS: no deformity or atrophy  Skin: warm and dry, no rash Neuro:  Alert and Oriented x 3, Strength and sensation are intact Psych: euthymic mood, full affect   EKG:  EKG was ordered today. 06/21/16-atrial fibrillation 71 bpm with septal infarct pattern personally viewed -prior 06/24/15-atrial fibrillation heart rate 60 with rightward axis, septal infarct pattern, no other changes. Personally viewed-prior 06/09/14-atrial fibrillation heart rate 65 bpm with occasional PVCs, vertical axis. Prior 06/12/13-atrial fibrillation heart rate 58, occasional PVCs, vertical axis. Poor R wave progression.  ECHO: 11/21/16: - Left ventricle: The cavity size was normal. There was mild focal   basal hypertrophy of the septum. Systolic function was normal.   The estimated ejection fraction was in the range of 55% to 60%.   Wall motion was normal; there were no regional wall motion   abnormalities. Doppler parameters are consistent with high   ventricular filling pressure. - Aortic valve: Valve mobility was restricted. There was moderate   stenosis. There was mild regurgitation. - Aortic root: The aortic root was mildly dilated. - Mitral valve: Calcified annulus. Mildly thickened leaflets .   There was mild regurgitation. - Left atrium: The atrium was severely dilated. - Right atrium: The atrium was severely dilated. - Tricuspid valve: There was mild-moderate regurgitation. - Pulmonary arteries: Systolic pressure was mildly increased. PA   peak pressure: 48 mm Hg (S). - Pericardium, extracardiac: A trivial pericardial effusion was   identified.  Impressions:  - Normal LV systolic function; elevated LV filling pressure; mildly   dilated aortic root (4.5 cm); suggest CTA to further assess;   thickened aortic valve with moderate AS (mean gradient 27 mmHg)   and mild AI; mild MR; severe biatrial enlargement; mild to   moderate TR with mildly elevated  pulmonary pressure.  CT chest: IMPRESSION: Thoracic aortic aneurysm. The ascending thoracic aorta measures up to 4.9 cm. Aortic root measures up to 4.5 cm. Negative for aortic dissection. Recommend semi-annual imaging followup by CTA or MRA and referral to cardiothoracic surgery if not already obtained. This recommendation follows 2010   ASSESSMENT AND PLAN:  Atypical chest pain -No further recurrences, likely musculoskeletal previously when she was working in her garden.  Permanent atrial fibrillation  - No changes, doing well.  No changes made.  Chronic anticoagulation  -She is going to Coumadin clinic soon.  [facility].  Essential hypertension  -Overall has been under good control.  No changes made. Continue with amlodipine hydrochlorothiazide and lisinopril.   Dilated aortic root  - 4.5-5 cm. Stable. No changes.  CT scan shows  4.9.  I will repeat in the beginning of December.  We did discuss that if it were to enlarge, we could consider sit down with surgery.  We also discussed risks and benefits of this.  Moderate to severe aortic stenosis  - Asymptomatic.  Repeat fairly stable. Echocardiogram reviewed again. 6  month f/u  Signed, Candee Furbish, MD [facility]  06/12/2017 1:42 PM

## 2017-06-13 ENCOUNTER — Other Ambulatory Visit: Payer: Self-pay | Admitting: *Deleted

## 2017-06-13 DIAGNOSIS — Z01812 Encounter for preprocedural laboratory examination: Secondary | ICD-10-CM

## 2017-06-13 DIAGNOSIS — I712 Thoracic aortic aneurysm, without rupture, unspecified: Secondary | ICD-10-CM

## 2017-06-13 DIAGNOSIS — I10 Essential (primary) hypertension: Secondary | ICD-10-CM

## 2017-06-13 DIAGNOSIS — Z79899 Other long term (current) drug therapy: Secondary | ICD-10-CM

## 2017-10-30 ENCOUNTER — Other Ambulatory Visit: Payer: Self-pay | Admitting: Cardiology

## 2017-11-23 ENCOUNTER — Inpatient Hospital Stay (HOSPITAL_COMMUNITY)
Admission: EM | Admit: 2017-11-23 | Discharge: 2017-11-28 | DRG: 194 | Disposition: A | Discharge status: Skilled Nursing Facility | Payer: Medicare Other | Attending: Internal Medicine | Admitting: Internal Medicine

## 2017-11-23 ENCOUNTER — Encounter (HOSPITAL_COMMUNITY): Payer: Self-pay

## 2017-11-23 ENCOUNTER — Other Ambulatory Visit: Payer: Self-pay

## 2017-11-23 ENCOUNTER — Emergency Department (HOSPITAL_COMMUNITY): Payer: Medicare Other

## 2017-11-23 DIAGNOSIS — I4819 Other persistent atrial fibrillation: Secondary | ICD-10-CM | POA: Diagnosis present

## 2017-11-23 DIAGNOSIS — Z87891 Personal history of nicotine dependence: Secondary | ICD-10-CM

## 2017-11-23 DIAGNOSIS — Z8249 Family history of ischemic heart disease and other diseases of the circulatory system: Secondary | ICD-10-CM | POA: Diagnosis not present

## 2017-11-23 DIAGNOSIS — Z79899 Other long term (current) drug therapy: Secondary | ICD-10-CM

## 2017-11-23 DIAGNOSIS — I350 Nonrheumatic aortic (valve) stenosis: Secondary | ICD-10-CM | POA: Diagnosis present

## 2017-11-23 DIAGNOSIS — I447 Left bundle-branch block, unspecified: Secondary | ICD-10-CM | POA: Diagnosis present

## 2017-11-23 DIAGNOSIS — Z66 Do not resuscitate: Secondary | ICD-10-CM | POA: Diagnosis present

## 2017-11-23 DIAGNOSIS — I10 Essential (primary) hypertension: Secondary | ICD-10-CM | POA: Diagnosis not present

## 2017-11-23 DIAGNOSIS — R0602 Shortness of breath: Secondary | ICD-10-CM | POA: Diagnosis present

## 2017-11-23 DIAGNOSIS — Z8673 Personal history of transient ischemic attack (TIA), and cerebral infarction without residual deficits: Secondary | ICD-10-CM | POA: Diagnosis not present

## 2017-11-23 DIAGNOSIS — S91002D Unspecified open wound, left ankle, subsequent encounter: Secondary | ICD-10-CM | POA: Diagnosis not present

## 2017-11-23 DIAGNOSIS — N179 Acute kidney failure, unspecified: Secondary | ICD-10-CM | POA: Diagnosis present

## 2017-11-23 DIAGNOSIS — E876 Hypokalemia: Secondary | ICD-10-CM | POA: Diagnosis not present

## 2017-11-23 DIAGNOSIS — Z888 Allergy status to other drugs, medicaments and biological substances status: Secondary | ICD-10-CM | POA: Diagnosis not present

## 2017-11-23 DIAGNOSIS — I4821 Permanent atrial fibrillation: Secondary | ICD-10-CM | POA: Diagnosis not present

## 2017-11-23 DIAGNOSIS — R0902 Hypoxemia: Secondary | ICD-10-CM | POA: Diagnosis present

## 2017-11-23 DIAGNOSIS — I248 Other forms of acute ischemic heart disease: Secondary | ICD-10-CM | POA: Diagnosis present

## 2017-11-23 DIAGNOSIS — I712 Thoracic aortic aneurysm, without rupture: Secondary | ICD-10-CM | POA: Diagnosis present

## 2017-11-23 DIAGNOSIS — IMO0001 Reserved for inherently not codable concepts without codable children: Secondary | ICD-10-CM

## 2017-11-23 DIAGNOSIS — Z85820 Personal history of malignant melanoma of skin: Secondary | ICD-10-CM

## 2017-11-23 DIAGNOSIS — R911 Solitary pulmonary nodule: Secondary | ICD-10-CM | POA: Diagnosis present

## 2017-11-23 DIAGNOSIS — S91002A Unspecified open wound, left ankle, initial encounter: Secondary | ICD-10-CM

## 2017-11-23 DIAGNOSIS — J189 Pneumonia, unspecified organism: Secondary | ICD-10-CM | POA: Diagnosis not present

## 2017-11-23 DIAGNOSIS — Z7901 Long term (current) use of anticoagulants: Secondary | ICD-10-CM | POA: Diagnosis not present

## 2017-11-23 DIAGNOSIS — L8990 Pressure ulcer of unspecified site, unspecified stage: Secondary | ICD-10-CM

## 2017-11-23 DIAGNOSIS — I4891 Unspecified atrial fibrillation: Secondary | ICD-10-CM | POA: Diagnosis present

## 2017-11-23 LAB — BASIC METABOLIC PANEL
ANION GAP: 9 (ref 5–15)
BUN: 38 mg/dL — ABNORMAL HIGH (ref 8–23)
CALCIUM: 9.9 mg/dL (ref 8.9–10.3)
CO2: 29 mmol/L (ref 22–32)
Chloride: 100 mmol/L (ref 98–111)
Creatinine, Ser: 1.41 mg/dL — ABNORMAL HIGH (ref 0.44–1.00)
GFR calc Af Amer: 37 mL/min — ABNORMAL LOW (ref >60)
GFR calc non Af Amer: 32 mL/min — ABNORMAL LOW (ref >60)
GLUCOSE: 101 mg/dL — AB (ref 70–99)
Potassium: 3.4 mmol/L — ABNORMAL LOW (ref 3.5–5.1)
Sodium: 138 mmol/L (ref 135–145)

## 2017-11-23 LAB — CBC WITH DIFFERENTIAL/PLATELET
Abs Immature Granulocytes: 0.06 10*3/uL (ref 0.00–0.07)
BASOS PCT: 0 %
Basophils Absolute: 0 10*3/uL (ref 0.0–0.1)
Eosinophils Absolute: 0.1 10*3/uL (ref 0.0–0.5)
Eosinophils Relative: 1 %
HCT: 43.7 % (ref 36.0–46.0)
Hemoglobin: 13.9 g/dL (ref 12.0–15.0)
IMMATURE GRANULOCYTES: 1 %
Lymphocytes Relative: 14 %
Lymphs Abs: 1.3 10*3/uL (ref 0.7–4.0)
MCH: 31 pg (ref 26.0–34.0)
MCHC: 31.8 g/dL (ref 30.0–36.0)
MCV: 97.3 fL (ref 80.0–100.0)
MONOS PCT: 10 %
Monocytes Absolute: 0.9 10*3/uL (ref 0.1–1.0)
NEUTROS PCT: 74 %
NRBC: 0 % (ref 0.0–0.2)
Neutro Abs: 7.1 10*3/uL (ref 1.7–7.7)
Platelets: 113 10*3/uL — ABNORMAL LOW (ref 150–400)
RBC: 4.49 MIL/uL (ref 3.87–5.11)
RDW: 15.2 % (ref 11.5–15.5)
WBC: 9.5 10*3/uL (ref 4.0–10.5)

## 2017-11-23 LAB — URINALYSIS, ROUTINE W REFLEX MICROSCOPIC
Bilirubin Urine: NEGATIVE
Glucose, UA: NEGATIVE mg/dL
Hgb urine dipstick: NEGATIVE
Ketones, ur: NEGATIVE mg/dL
Nitrite: NEGATIVE
Protein, ur: 100 mg/dL — AB
SPECIFIC GRAVITY, URINE: 1.017 (ref 1.005–1.030)
pH: 6 (ref 5.0–8.0)

## 2017-11-23 LAB — PROTIME-INR
INR: 2.45
PROTHROMBIN TIME: 26.2 s — AB (ref 11.4–15.2)

## 2017-11-23 LAB — TROPONIN I
TROPONIN I: 0.04 ng/mL — AB (ref <0.03)
Troponin I: 0.05 ng/mL (ref <0.03)

## 2017-11-23 MED ORDER — AMLODIPINE BESYLATE 5 MG PO TABS
5.0000 mg | ORAL_TABLET | Freq: Every day | ORAL | Status: DC
  Administered 2017-11-24 – 2017-11-28 (×5): 5 mg via ORAL
  Filled 2017-11-23 (×5): qty 1

## 2017-11-23 MED ORDER — SODIUM CHLORIDE 0.9 % IV SOLN
INTRAVENOUS | Status: AC
  Administered 2017-11-23: 21:00:00 via INTRAVENOUS

## 2017-11-23 MED ORDER — ACETAMINOPHEN 325 MG PO TABS
650.0000 mg | ORAL_TABLET | Freq: Three times a day (TID) | ORAL | Status: DC | PRN
  Administered 2017-11-26: 650 mg via ORAL
  Filled 2017-11-23: qty 2

## 2017-11-23 MED ORDER — SODIUM CHLORIDE 0.9 % IV SOLN
Freq: Once | INTRAVENOUS | Status: AC
Start: 2017-11-23 — End: 2017-11-23
  Administered 2017-11-23: 18:00:00 via INTRAVENOUS

## 2017-11-23 MED ORDER — SODIUM CHLORIDE 0.9 % IV SOLN
500.0000 mg | INTRAVENOUS | Status: DC
  Administered 2017-11-23 – 2017-11-24 (×2): 500 mg via INTRAVENOUS
  Filled 2017-11-23: qty 500

## 2017-11-23 MED ORDER — WARFARIN SODIUM 3 MG PO TABS
3.0000 mg | ORAL_TABLET | Freq: Every day | ORAL | Status: DC
  Administered 2017-11-24 – 2017-11-27 (×5): 3 mg via ORAL
  Filled 2017-11-23 (×5): qty 1

## 2017-11-23 MED ORDER — WARFARIN - PHARMACIST DOSING INPATIENT
Freq: Every day | Status: DC
  Administered 2017-11-24: 17:00:00
  Administered 2017-11-25 – 2017-11-26 (×2): 1
  Administered 2017-11-27: 18:00:00

## 2017-11-23 MED ORDER — SODIUM CHLORIDE 0.9 % IV SOLN
1.0000 g | Freq: Once | INTRAVENOUS | Status: AC
  Administered 2017-11-23: 1 g via INTRAVENOUS
  Filled 2017-11-23: qty 10

## 2017-11-23 MED ORDER — POTASSIUM CHLORIDE 20 MEQ/15ML (10%) PO SOLN
40.0000 meq | Freq: Once | ORAL | Status: AC
  Administered 2017-11-23: 40 meq via ORAL
  Filled 2017-11-23: qty 30

## 2017-11-23 MED ORDER — CALCIUM CARBONATE-VITAMIN D 500-200 MG-UNIT PO TABS
1.0000 | ORAL_TABLET | Freq: Every day | ORAL | Status: DC
  Administered 2017-11-24 – 2017-11-28 (×5): 1 via ORAL
  Filled 2017-11-23 (×5): qty 1

## 2017-11-23 MED ORDER — SODIUM CHLORIDE 0.9 % IV SOLN
500.0000 mg | Freq: Once | INTRAVENOUS | Status: DC
  Administered 2017-11-23: 500 mg via INTRAVENOUS
  Filled 2017-11-23: qty 500

## 2017-11-23 MED ORDER — SODIUM CHLORIDE 0.9 % IV SOLN
1.0000 g | INTRAVENOUS | Status: DC
  Administered 2017-11-24 – 2017-11-28 (×5): 1 g via INTRAVENOUS
  Filled 2017-11-23 (×6): qty 10

## 2017-11-23 MED ORDER — ENSURE ENLIVE PO LIQD
237.0000 mL | Freq: Two times a day (BID) | ORAL | Status: DC
  Administered 2017-11-24 – 2017-11-28 (×2): 237 mL via ORAL

## 2017-11-23 MED ORDER — IPRATROPIUM-ALBUTEROL 0.5-2.5 (3) MG/3ML IN SOLN
3.0000 mL | Freq: Once | RESPIRATORY_TRACT | Status: AC
  Administered 2017-11-23: 3 mL via RESPIRATORY_TRACT
  Filled 2017-11-23: qty 3

## 2017-11-23 NOTE — ED Notes (Signed)
Pt ambulated to the bathroom on pulse ox. Pt stated she got dizzy and needed to sit down. Pt's O2 dropped down to 86%. Got pt to the bathroom to obtain urine sample. Wheeled pt in wheelchair back to room because pt said she didn't feel like she could walk back. RN Hannie notified

## 2017-11-23 NOTE — ED Provider Notes (Signed)
Patient placed in Quick Look pathway, seen and evaluated   Chief Complaint: shortness of breath, cough  HPI: Julie Mosley is a 84 y.o. female who presents to the ED for a cough and shortness of breath since being dx with pneumonia a week ago. Patient finished her antibiotics, Levaquin, and steroids but has not gotten any better. Patient c/o feeling weak.   ROS: Resp: shortness of breath, cough  Physical Exam:  BP 93/72 (BP Location: Right Arm)   Pulse 85   Temp 98.3 F (36.8 C) (Oral)   Resp 16   LMP  (LMP Unknown)   SpO2 95%    Gen: No distress  Neuro: Awake and Alert  Skin: Warm and dry  Resp: no distress   Initiation of care has begun. The patient has been counseled on the process, plan, and necessity for staying for the completion/evaluation, and the remainder of the medical screening examination    Ashley Murrain, NP 11/23/17 1448    Lacretia Leigh, MD 11/24/17 [phone]

## 2017-11-23 NOTE — ED Provider Notes (Signed)
[facility] EMERGENCY DEPARTMENT Provider Note  CSN: [id number] Arrival date & time: 11/23/17  1428  History   Chief Complaint Chief Complaint  Patient presents with  . Shortness of Breath    HPI Julie Mosley is a 84 y.o. female with a medical history of aortic stenosis, a-fib, HTN, skin cancer and TIA who presented to the ED for   Shortness of Breath  The current episode started more than 1 week ago. The problem has not changed since onset.Associated symptoms include cough and sputum production. Pertinent negatives include no fever, no rhinorrhea, no sore throat, no hemoptysis, no wheezing, no leg swelling and no claudication. Associated symptoms comments: Decreased appetite and weakness. Treatments tried: PO steroids and Levaquin for CAP. The treatment provided mild relief. She has had no prior hospitalizations. She has had no prior ED visits. She has had no prior ICU admissions. Associated medical issues include pneumonia (Treated as outpatient last week).    Past Medical History:  Diagnosis Date  . Cancer ([place])   . Chronic anticoagulation 12/13/2012   Followed by Dr. Chapman Fitch  . Dilated aortic root ([place]) 12/13/2012   4.5 cm 2013.   Marland Kitchen Hypertension   . Mild aortic stenosis 12/13/2012  . TIA (transient ischemic attack)     Patient Active Problem List   Diagnosis Date Noted  . Aortic stenosis 12/22/2014  . Persistent atrial fibrillation 12/22/2014  . Postinflammatory pulmonary fibrosis ([place]) 01/15/2014  . Chronic anticoagulation 12/13/2012  . Mild aortic stenosis 12/13/2012  . Dilated aortic root ([place]) 12/13/2012  . A-fib ([place]) 04/14/2012  . History of stroke 04/14/2012  . Essential hypertension, benign 04/14/2012  . Other and unspecified hyperlipidemia 04/14/2012  . Senile osteoporosis 04/14/2012  . Uncontrolled pain 04/03/2012  . Neck pain 04/02/2012  . H/O: CVA (cerebrovascular accident) 04/02/2012  . Nausea 04/02/2012  . Hypertension   . TIA  (transient ischemic attack)     Past Surgical History:  Procedure Laterality Date  . BREAST LUMPECTOMY     bilateral  . CESAREAN SECTION    . HIP SURGERY    . SKIN BIOPSY     From left foot.     OB History   None      Home Medications    Prior to Admission medications   Medication Sig Start Date End Date Taking? Authorizing Provider  alendronate (FOSAMAX) 70 MG tablet Take 70 mg by mouth once a week. Take with a full glass of water on an empty stomach. Takes on Tuesday morning    [provider]  amLODipine (NORVASC) 5 MG tablet TAKE 1 TABLET BY MOUTH  DAILY 01/23/17   Jerline Pain, MD  calcium carbonate (OS-CAL - DOSED IN MG OF ELEMENTAL CALCIUM) 1250 MG tablet Take 1 tablet by mouth daily.    [provider]  glucosamine-chondroitin 500-400 MG tablet Take 1 tablet by mouth 3 (three) times daily.    [provider]  hydrochlorothiazide (HYDRODIURIL) 25 MG tablet TAKE ONE TABLET BY MOUTH ONCE DAILY 03/01/16   Jerline Pain, MD  lisinopril (PRINIVIL,ZESTRIL) 20 MG tablet TAKE 1 TABLET BY MOUTH TWO  TIMES DAILY 10/30/17   Jerline Pain, MD  Misc Natural Products (OSTEO BI-FLEX JOINT SHIELD PO) Take 1 tablet by mouth daily.     [provider]  Multiple Vitamins-Minerals (CENTRUM SILVER PO) Take 1 tablet by mouth daily.     [provider]  Omega-3 Fatty Acids (FISH OIL) 1000 MG CAPS Take 1  capsule by mouth daily.    [provider]  warfarin (COUMADIN) 1 MG tablet Take 4 mg by mouth as directed.     [provider]    Family History Family History  Problem Relation Age of Onset  . Heart attack Mother   . Heart attack Sister   . Heart attack Brother     Social History Social History   Tobacco Use  . Smoking status: Former Smoker    Last attempt to quit: 04/03/1975    Years since quitting: 42.6  . Smokeless tobacco: Never Used  Substance Use Topics  . Alcohol use: No  . Drug use: No   Allergies   Valium  [diazepam]   Review of Systems Review of Systems  Constitutional: Positive for appetite change. Negative for chills and fever.  HENT: Negative for rhinorrhea and sore throat.   Respiratory: Positive for cough, sputum production and shortness of breath. Negative for hemoptysis and wheezing.   Cardiovascular: Negative for claudication and leg swelling.  Gastrointestinal: Negative.   Endocrine: Negative.   Genitourinary: Negative.   Musculoskeletal: Negative.   Skin: Positive for wound (Left ankle from skin cancer excision).  Neurological: Positive for weakness. Negative for dizziness, light-headedness and numbness.    Physical Exam Updated Vital Signs BP 93/72 (BP Location: Right Arm)   Pulse 85   Temp 98.3 F (36.8 C) (Oral)   Resp 16   LMP  (LMP Unknown)   SpO2 95%   Physical Exam  Constitutional: Vital signs are normal. She is cooperative.  Chronic ill appearance. Thin frame.  HENT:  Mouth/Throat: Uvula is midline and oropharynx is clear and moist. Mucous membranes are dry.  Eyes: Pupils are equal, round, and reactive to light. EOM and lids are normal.  Cardiovascular: Intact distal pulses and normal pulses. An irregularly irregular rhythm present.  Murmur heard.  Systolic murmur is present. Pulmonary/Chest: Effort normal. No accessory muscle usage. No tachypnea. No respiratory distress. She has no wheezes. She has rhonchi.  Able to speak in complete sentences. Occasional coughs throughout the interview with clear sputum production.  Musculoskeletal: Normal range of motion.  Neurological: She is alert. She has normal strength. She exhibits normal muscle tone.  Skin: Skin is dry and intact. Capillary refill takes 2 to 3 seconds.  Nursing note and vitals reviewed.  ED Treatments / Results  Labs (all labs ordered are listed, but only abnormal results are displayed) Labs Reviewed  BASIC METABOLIC PANEL - Abnormal; Notable for the following components:      Result Value    Potassium 3.4 (*)    Glucose, Bld 101 (*)    BUN 38 (*)    Creatinine, Ser 1.41 (*)    GFR calc non Af Amer 32 (*)    GFR calc Af Amer 37 (*)    All other components within normal limits  CBC WITH DIFFERENTIAL/PLATELET    EKG None  Radiology No results found.  Procedures Procedures (including critical care time)  Medications Ordered in ED Medications - No data to display   Initial Impression / Assessment and Plan / ED Course  Triage vital signs and the nursing notes have been reviewed.  Pertinent labs & imaging results that were available during care of the patient were reviewed and considered in medical decision making (see chart for details).  Patient presents to the ED afebrile with complaints of SOB. Per patient report, she was treated for pneumonia with antibiotics and steroids by her PCP. It is not  uncommon for patient's with CAP to have residual symptoms after treatment. Crackles heard throughout lung fields on exam, but patient is not in respiratory distress, is able to maintain oxygen sats > 95% at rest  It is reassuring that she is able to maintain oxygen saturations > 95% at rest and with ambulation. CXR ordered in triage for further evaluation.  Clinical Course as of Nov 23 1824  Thu Nov 23, 2017  1551 CXR shows residual signs of PNA present which is expected as patient is < 1 week after initial diagnosis and treatment. Changes typically take much longer before they are seen. Reassuring that there are no other new acute findings. Increase in creatinine since last CMP from last year. Possibly due to dehydration as patient reports decreased oral intake due to loss of appetite. Case discussed with Dr. Aletta Edouard. Will determine if patient needs to be hospitalized after her oxygen sats are measured with ambulation.  Also ordering Duo-Neb treatment as she is reporting SOB.   [GM]  1330 84 year old female here with worsening weakness and anorexia after being treated for  pneumonia about a week ago.  She still having some shortness of breath but no fever.  Her x-ray looks a little worse and her creatinine is elevated.  She likely will benefit from being admitted to the hospital for IV antibiotics and hydration.   [MB]  9326 EKG consistent with latest EKG in 10/2016. No acute ST changes or signs of ischemia/infarct.   [GM]  1805 Patient attempted ambulation to the bathroom, but reports unable to make it due to dizziness, weakness and SOB. Oxygen saturation dropped to 86%. Will consult [facility] for admission for PNA. IV Rocephin and Zithromax ordered for PNA treatment.   [GM]  7124 Case discussed with [facility] who will admit the patient.   [GM]    Clinical Course User Index [GM] Kari Montero, Jonelle Sports, PA-C [MB] Hayden Rasmussen, MD   Final Clinical Impressions(s) / ED Diagnoses  1. Community Acquired PNA. IV Rocephin + Zithromax started in the ED for treatment. Patient stable to go to general med-surg floor. Case discussed with [facility] for admission.   Dispo: Admit.  Final diagnoses:  Community acquired pneumonia, unspecified laterality    ED Discharge Orders    None        Romie Jumper, [place] 11/23/17 1826    Hayden Rasmussen, MD 11/24/17 [phone]

## 2017-11-23 NOTE — Progress Notes (Signed)
ANTICOAGULATION CONSULT NOTE - Initial Consult  Pharmacy Consult for Coumadin Indication: atrial fibrillation  Allergies  Allergen Reactions  . Valium [Diazepam] Other (See Comments)    Stopped breathing    Vital Signs: Temp: 97.7 F (36.5 C) (10/31 2319) Temp Source: Oral (10/31 2319) BP: 142/85 (10/31 2319) Pulse Rate: 82 (10/31 2319)  Labs: Recent Labs    11/23/17 1446 11/23/17 1645 11/23/17 2220  HGB 13.9  --   --   HCT 43.7  --   --   PLT 113*  --   --   LABPROT  --   --  26.2*  INR  --   --  2.45  CREATININE 1.41*  --   --   TROPONINI  --  0.04* 0.05*    Medical History: Past Medical History:  Diagnosis Date  . Cancer ([place])   . Chronic anticoagulation 12/13/2012   Followed by Dr. Chapman Fitch  . Dilated aortic root ([place]) 12/13/2012   4.5 cm 2013.   Marland Kitchen Hypertension   . Mild aortic stenosis 12/13/2012  . TIA (transient ischemic attack)     Medications:  Medications Prior to Admission  Medication Sig Dispense Refill Last Dose  . acetaminophen (TYLENOL) 650 MG CR tablet Take 650 mg by mouth every 8 (eight) hours as needed for pain.   unk  . amLODipine (NORVASC) 5 MG tablet TAKE 1 TABLET BY MOUTH  DAILY (Patient taking differently: Take 5 mg by mouth daily. ) 90 tablet 3 11/23/2017 at Unknown time  . Calcium Carbonate-Vitamin D (CALCIUM 600+D) 600-200 MG-UNIT TABS Take 1 tablet by mouth daily.   11/23/2017 at Unknown time  . hydrochlorothiazide (HYDRODIURIL) 25 MG tablet TAKE ONE TABLET BY MOUTH ONCE DAILY (Patient taking differently: 25 mg daily. ) 90 tablet 2 11/23/2017 at Unknown time  . lisinopril (PRINIVIL,ZESTRIL) 20 MG tablet TAKE 1 TABLET BY MOUTH TWO  TIMES DAILY (Patient taking differently: Take 20 mg by mouth daily. ) 180 tablet 2 11/22/2017 at Unknown time  . Misc Natural Products (OSTEO BI-FLEX JOINT SHIELD PO) Take 1 tablet by mouth daily.    unk  . Multiple Vitamins-Minerals (CENTRUM SILVER PO) Take 1 tablet by mouth daily.    11/23/2017 at Unknown time   . Omega-3 Fatty Acids (FISH OIL) 1000 MG CAPS Take 1 capsule by mouth daily.   11/23/2017 at Unknown time  . warfarin (COUMADIN) 3 MG tablet Take 3 mg by mouth daily.    11/22/2017 at 2000   Scheduled:  . [START ON 11/24/2017] amLODipine  5 mg Oral Daily  . [START ON 11/24/2017] calcium-vitamin D  1 tablet Oral Daily  . [START ON 11/24/2017] feeding supplement (ENSURE ENLIVE)  237 mL Oral BID BM   Infusions:  . sodium chloride 100 mL/hr at 11/23/17 2049  . azithromycin 500 mg (11/23/17 2051)  . [START ON 11/24/2017] cefTRIAXone (ROCEPHIN)  IV      Assessment: 84yo female c/o SOB, admitted w/ CAP, to continue Coumadin for Afib; current INR at goal w/ last dose of Coumadin taken 10/30.  Goal of Therapy:  INR 2-3   Plan:  Will continue home Coumadin dose of 3mg  daily and monitor INR.  Wynona Neat, PharmD, BCPS  11/23/2017,11:54 PM

## 2017-11-23 NOTE — ED Triage Notes (Signed)
Pt presents for evaluation of ongoing sob and cough since being diagnosed with pneumonia a week ago, has finished antibiotics and steroids but has not gotten better.

## 2017-11-23 NOTE — H&P (Addendum)
History and Physical    Julie Mosley JJO:[id number] DOB: 1926-10-18 DOA: 11/23/2017  PCP: Dineen Kid, MD  Patient coming from: Home  I have personally briefly reviewed patient's old medical records in [place]  Chief Complaint: Shortness of breath  HPI: EVALISE Julie Mosley is a 84 y.o. female with medical history significant for persistent atrial fibrillation on Coumadin, hypertension, history of TIA, moderate to severe aortic stenosis, dilated aortic root, left bundle branch block, and melanoma with surgical wound after left lateral ankle who presents the ED with shortness of breath.  Patient reports several weeks of shortness of breath with exertion, initially she did not think much of this.  However since her symptoms continued and she saw her PCP 1 week ago.  She had a chest x-ray performed and was diagnosed with pneumonia.  She was treated with and completed a one-week course of Levaquin and prednisone.  Despite this, she has had continued shortness of breath with minimal activity.  She has had associated cough productive of yellow sputum.  She reports poor appetite with low oral intake and low energy.  She has felt chills but denies fevers, chest pain, hemoptysis, abdominal pain, diaphoresis.  She is taking Coumadin and denies any obvious bleeding.  ED Course:  BP 93/72, pulse 85, RR 16, temp 98.7F, SPO2 95% on room air at rest >> 86% on room air with ambulation. WBC 9.5, Hgb 13.9, Plt 113 K 3.4, Creatinine 1.41 (0.86 one year ago) Troponin I 0.04 UA negative nitrities, trace leukocytes, rare bacteria  EKG showed Afib with IVCD (LBBB on prior EKG), controlled rate. CXR showed persistent patchy infiltrates, right greater than left and more pronounced on the left compared to prior.  She was given a duoneb treatment and started on IV Ceftriaxone and Azithromycin. The hospitalist service was consulted to admit for hypoxia with ambulation and community acquired pneumonia failing  outpatient treatment.   Review of Systems: As per HPI otherwise 10 point review of systems negative.    Past Medical History:  Diagnosis Date  . Cancer ([place])   . Chronic anticoagulation 12/13/2012   Followed by Dr. Chapman Fitch  . Dilated aortic root ([place]) 12/13/2012   4.5 cm 2013.   Marland Kitchen Hypertension   . Mild aortic stenosis 12/13/2012  . TIA (transient ischemic attack)     Past Surgical History:  Procedure Laterality Date  . BREAST LUMPECTOMY     bilateral  . CESAREAN SECTION    . HIP SURGERY    . SKIN BIOPSY     From left foot.     reports that she quit smoking about 42 years ago. She has never used smokeless tobacco. She reports that she does not drink alcohol or use drugs.  Allergies  Allergen Reactions  . Valium [Diazepam] Other (See Comments)    Stopped breathing    Family History  Problem Relation Age of Onset  . Heart attack Mother   . Heart attack Sister   . Heart attack Brother      Prior to Admission medications   Medication Sig Start Date End Date Taking? Authorizing Provider  alendronate (FOSAMAX) 70 MG tablet Take 70 mg by mouth once a week. Take with a full glass of water on an empty stomach. Takes on Tuesday morning    [provider]  amLODipine (NORVASC) 5 MG tablet TAKE 1 TABLET BY MOUTH  DAILY 01/23/17   Jerline Pain, MD  calcium carbonate (OS-CAL - DOSED IN MG OF ELEMENTAL  CALCIUM) 1250 MG tablet Take 1 tablet by mouth daily.    [provider]  glucosamine-chondroitin 500-400 MG tablet Take 1 tablet by mouth 3 (three) times daily.    [provider]  hydrochlorothiazide (HYDRODIURIL) 25 MG tablet TAKE ONE TABLET BY MOUTH ONCE DAILY 03/01/16   Jerline Pain, MD  lisinopril (PRINIVIL,ZESTRIL) 20 MG tablet TAKE 1 TABLET BY MOUTH TWO  TIMES DAILY 10/30/17   Jerline Pain, MD  Misc Natural Products (OSTEO BI-FLEX JOINT SHIELD PO) Take 1 tablet by mouth daily.     [provider]  Multiple Vitamins-Minerals (CENTRUM  SILVER PO) Take 1 tablet by mouth daily.     [provider]  Omega-3 Fatty Acids (FISH OIL) 1000 MG CAPS Take 1 capsule by mouth daily.    [provider]  warfarin (COUMADIN) 1 MG tablet Take 4 mg by mouth as directed.     [provider]    Physical Exam: Vitals:   11/23/17 1815 11/23/17 1830 11/23/17 1900 11/23/17 1945  BP: 132/78 133/81 123/80 137/79  Pulse: (!) 58 82 82 82  Resp: 20 16 (!) 23 19  Temp:      TempSrc:      SpO2: 98% 93% 93% 96%    Constitutional: Elderly woman resting in bed, NAD, calm, comfortable Eyes: PERRL, lids and conjunctivae normal ENMT: Mucous membranes are dry. Posterior pharynx clear of any exudate or lesions. Neck: normal, supple, no masses. Respiratory: Coarse rhonchi with expiratory wheezing bilaterally, right greater than left.  Normal respiratory effort. No accessory muscle use.  Cardiovascular: Irregularly irregular, systolic murmur loudest at the lower left sternal border. No extremity edema. 2+ pedal pulses. No carotid bruits.  Abdomen: no tenderness, no masses palpated. No hepatosplenomegaly. Bowel sounds positive.  Musculoskeletal: no clubbing / cyanosis. No joint deformity upper and lower extremities. Good ROM, no contractures. Normal muscle tone.  Skin: Circumferential ulcer of her left lateral malleolus with yellow granulation tissue and minimal serosanguineous discharge.  No surrounding erythema. Neurologic: CN 2-12 grossly intact. Sensation intact, DTR normal. Strength 5/5 in all 4.  Psychiatric: Normal judgment and insight. Alert and oriented x 3. Normal mood.    Labs on Admission: I have personally reviewed following labs and imaging studies  CBC: Recent Labs  Lab 11/23/17 1446  WBC 9.5  NEUTROABS 7.1  HGB 13.9  HCT 43.7  MCV 97.3  PLT 607*   Basic Metabolic Panel: Recent Labs  Lab 11/23/17 1446  NA 138  K 3.4*  CL 100  CO2 29  GLUCOSE 101*  BUN 38*  CREATININE 1.41*  CALCIUM 9.9    GFR: CrCl cannot be calculated (Unknown ideal weight.). Liver Function Tests: No results for input(s): AST, ALT, ALKPHOS, BILITOT, PROT, ALBUMIN in the last 168 hours. No results for input(s): LIPASE, AMYLASE in the last 168 hours. No results for input(s): AMMONIA in the last 168 hours. Coagulation Profile: No results for input(s): INR, PROTIME in the last 168 hours. Cardiac Enzymes: Recent Labs  Lab 11/23/17 1645  TROPONINI 0.04*   BNP (last 3 results) No results for input(s): PROBNP in the last 8760 hours. HbA1C: No results for input(s): HGBA1C in the last 72 hours. CBG: No results for input(s): GLUCAP in the last 168 hours. Lipid Profile: No results for input(s): CHOL, HDL, LDLCALC, TRIG, CHOLHDL, LDLDIRECT in the last 72 hours. Thyroid Function Tests: No results for input(s): TSH, T4TOTAL, FREET4, T3FREE, THYROIDAB in the last 72 hours. Anemia Panel: No results for input(s):  VITAMINB12, FOLATE, FERRITIN, TIBC, IRON, RETICCTPCT in the last 72 hours. Urine analysis:    Component Value Date/Time   COLORURINE YELLOW 11/23/2017 1727   APPEARANCEUR HAZY (A) 11/23/2017 1727   LABSPEC 1.017 11/23/2017 1727   PHURINE 6.0 11/23/2017 1727   GLUCOSEU NEGATIVE 11/23/2017 1727   HGBUR NEGATIVE 11/23/2017 1727   [place] 11/23/2017 1727   KETONESUR NEGATIVE 11/23/2017 1727   PROTEINUR 100 (A) 11/23/2017 1727   UROBILINOGEN 1.0 04/04/2012 0227   NITRITE NEGATIVE 11/23/2017 1727   LEUKOCYTESUR TRACE (A) 11/23/2017 1727    Radiological Exams on Admission: Dg Chest 2 View  Result Date: 11/23/2017 CLINICAL DATA:  Pneumonia.  Cough and shortness of breath. EXAM: CHEST - 2 VIEW COMPARISON:  November 16, 2017 FINDINGS: Patchy infiltrates in the lungs, right greater than left, similar on the right in the interval and mildly more pronounced on the left. Cardiomegaly. The hila and mediastinum are normal. No pneumothorax. No pulmonary nodules or masses. IMPRESSION: Persistent  bilateral pulmonary infiltrates, similar on the right in the interval at and mildly more prominent on the left. Recommend treatment with short-term follow-up to ensure resolution. Electronically Signed   By: Dorise Bullion III M.D   On: 11/23/2017 15:43    EKG: Independently reviewed.  Atrial fibrillation with IVCD vs LBBB (? V1 lead placement), controlled rate.  Assessment/Plan Principal Problem:   Community acquired pneumonia Active Problems:   Hypertension   A-fib ([place])   Open wound of left ankle   Hypokalemia   AKI (acute kidney injury) ([place])   Pulmonary nodule less than 6 cm determined by computed tomography of lung    DALISHA SHIVELY is a 84 y.o. female with medical history significant for persistent atrial fibrillation on Coumadin, hypertension, history of TIA, moderate to severe aortic stenosis, dilated aortic root, left bundle branch block, and melanoma with surgical wound after left lateral ankle who presents the ED with continued shortness of breath after outpatient treatment for CAP with Levaquin and Prednisone.  Community acquired pneumonia: Completed 1 week outpatient treatment with Levaquin and prednisone.  She has continued shortness of breath with hypoxia on ambulation.  Will likely need IV antibiotic therapy for 2 to 3 days. -IV Ceftriaxone and Azithromycin -Check strep pneumo, legionella urine antigen -supplemental O2 as needed  Acute kidney injury: Cr elevated to 1.4 compared to normal baseline. Likely prerenal in setting of poor oral intake. -IV NS @ 100/hr overnight -Repeat BMET in AM -Holding home lisinopril and hctz  Persistent atrial fibrillation on Coumadin: CHADSVASc 6. Chronic and stable issue with controlled rates. Has a history of bradycardia with AV nodal blockers. -Continue coumadin per pharmacy  Open wound of left lateral malleolus: Reportedly secondary to surgical procedure for treatment of melanoma.  -consult wound care  Hypokalemia: K 3.4,  repleting.  Hypertension: BP controlled here. Holding home Lisinopril and HCTZ with AKI as above. Resume home Amlodipine 5 mg daily.  Mild Troponin Elevation: Troponin 0.04, likely from demand ischemia in setting of AKI. Will repeat once and only trend further if rising.  Thoracic Aortic Anuerysm: Follows with Cardiology Dr. Marlou Porch for aortic root dilation. CTA Chest Aorta 12/23/16 showed Thoracic aortic aneurysm. The ascending thoracic aorta measures up to 4.9 cm. Aortic root measures up to 4.5 cm. Negative for aortic dissection.  Subcentimeter Pulmonary Nodules seen on CT Imaging: CTA Chest Aorta 12/23/16 showed indeterminate subcentimeter pulmonary nodules. Largest pulmonary nodule measures roughly 0.7 cm. Non-contrast chest CT at 6-12 months is recommended.  DVT prophylaxis:  Coumadin  Code Status: DNR  Family Communication: Discussed with two sons at bedside.  Disposition Plan: Anticipate at least two midnight stay for IV antibiotic treatment of CAP due to outpatient failure and new hypoxia with ambulation. Likely d/c to home when respiratory status improved.  Consults called: none  Admission status: Inpatient    Zada Finders MD [facility] Pager [phone]  If 7PM-7AM, please contact night-coverage [email or web address] Password [email or web address]  11/23/2017, 8:22 PM

## 2017-11-23 NOTE — ED Notes (Signed)
Attempted report 

## 2017-11-24 ENCOUNTER — Inpatient Hospital Stay (HOSPITAL_COMMUNITY): Payer: Medicare Other

## 2017-11-24 DIAGNOSIS — E876 Hypokalemia: Secondary | ICD-10-CM

## 2017-11-24 DIAGNOSIS — L8990 Pressure ulcer of unspecified site, unspecified stage: Secondary | ICD-10-CM

## 2017-11-24 DIAGNOSIS — R911 Solitary pulmonary nodule: Secondary | ICD-10-CM

## 2017-11-24 LAB — CBC
HEMATOCRIT: 37.3 % (ref 36.0–46.0)
Hemoglobin: 12 g/dL (ref 12.0–15.0)
MCH: 31 pg (ref 26.0–34.0)
MCHC: 32.2 g/dL (ref 30.0–36.0)
MCV: 96.4 fL (ref 80.0–100.0)
NRBC: 0 % (ref 0.0–0.2)
Platelets: 93 10*3/uL — ABNORMAL LOW (ref 150–400)
RBC: 3.87 MIL/uL (ref 3.87–5.11)
RDW: 15.2 % (ref 11.5–15.5)
WBC: 7.4 10*3/uL (ref 4.0–10.5)

## 2017-11-24 LAB — BASIC METABOLIC PANEL
Anion gap: 5 (ref 5–15)
BUN: 35 mg/dL — ABNORMAL HIGH (ref 8–23)
CALCIUM: 8.7 mg/dL — AB (ref 8.9–10.3)
CHLORIDE: 105 mmol/L (ref 98–111)
CO2: 29 mmol/L (ref 22–32)
CREATININE: 1.27 mg/dL — AB (ref 0.44–1.00)
GFR calc non Af Amer: 36 mL/min — ABNORMAL LOW (ref >60)
GFR, EST AFRICAN AMERICAN: 42 mL/min — AB (ref >60)
GLUCOSE: 98 mg/dL (ref 70–99)
Potassium: 3.9 mmol/L (ref 3.5–5.1)
Sodium: 139 mmol/L (ref 135–145)

## 2017-11-24 LAB — HIV ANTIBODY (ROUTINE TESTING W REFLEX): HIV SCREEN 4TH GENERATION: NONREACTIVE

## 2017-11-24 LAB — PROTIME-INR
INR: 2.36
Prothrombin Time: 25.5 seconds — ABNORMAL HIGH (ref 11.4–15.2)

## 2017-11-24 LAB — STREP PNEUMONIAE URINARY ANTIGEN: Strep Pneumo Urinary Antigen: NEGATIVE

## 2017-11-24 MED ORDER — COLLAGENASE 250 UNIT/GM EX OINT
TOPICAL_OINTMENT | Freq: Every day | CUTANEOUS | Status: DC
  Administered 2017-11-25: 09:00:00 via TOPICAL
  Administered 2017-11-26: 1 via TOPICAL
  Administered 2017-11-27 – 2017-11-28 (×2): via TOPICAL
  Filled 2017-11-24: qty 30

## 2017-11-24 MED ORDER — LIP MEDEX EX OINT
TOPICAL_OINTMENT | CUTANEOUS | Status: DC | PRN
  Filled 2017-11-24: qty 7

## 2017-11-24 NOTE — Evaluation (Signed)
Physical Therapy Evaluation Patient Details Name: Julie Mosley MRN: [id number] DOB: 1926/03/13 Today's Date: 11/24/2017   History of Present Illness  Patient is a 84 y/o female who presents with SOB, fever and cough, recently diagnosed with PNA but failed outpatient treatment. CXR-showing billateral pulmonary infiltrates. PMH includes A-fib on Coumadin, TIA, aortic stenosis, LBBB and melanoma surgically removed.  Clinical Impression  Patient presents with generalized deconditioning, weakness, dyspnea on exertion and impaired balance s/p above. Tolerated standing and gait training with Min A for balance/safety. Would benefit from use of RW for support to minimize fall risk. Pt lives alone and independent PTA- furniture walker at baseline. Reports 1 fall last summer. Sp02 remained >90% on RA. Has stairs to negotiate to get into home. Will follow acutely to maximize independence and mobility prior to return home.     Follow Up Recommendations Home health PT;Supervision for mobility/OOB;Supervision/Assistance - 24 hour    Equipment Recommendations  None recommended by PT    Recommendations for Other Services       Precautions / Restrictions Precautions Precautions: Fall Restrictions Weight Bearing Restrictions: No      Mobility  Bed Mobility Overal bed mobility: Needs Assistance Bed Mobility: Rolling;Sidelying to Sit Rolling: Min guard Sidelying to sit: Min guard;HOB elevated       General bed mobility comments: Use of rail to get to EOB.  Transfers Overall transfer level: Needs assistance Equipment used: None Transfers: Sit to/from Stand Sit to Stand: Min assist;Min guard         General transfer comment: Min A to steady in standing, stood from EOB x3, urinary incontinence in standing. transferred to chair post ambulation.  Ambulation/Gait Ambulation/Gait assistance: Min assist Gait Distance (Feet): 120 Feet Assistive device: 1 person hand held assist Gait  Pattern/deviations: Step-through pattern;Decreased stride length;Drifts right/left;Narrow base of support Gait velocity: decreased   General Gait Details: Slow, unsteady gait requiring HHA for balance. 2/4 DOE. Sp02 90% on RA.   Stairs            Wheelchair Mobility    Modified Rankin (Stroke Patients Only)       Balance Overall balance assessment: Needs assistance;History of Falls Sitting-balance support: Feet supported;No upper extremity supported Sitting balance-Leahy Scale: Good     Standing balance support: During functional activity;Single extremity supported Standing balance-Leahy Scale: Poor Standing balance comment: Requires UE support for standing balance. Able to help pull up underwear and perform pericare.                             Pertinent Vitals/Pain Pain Assessment: Faces Faces Pain Scale: Hurts little more Pain Location: left foot- little toe Pain Descriptors / Indicators: Sore Pain Intervention(s): Monitored during session;Repositioned    Home Living Family/patient expects to be discharged to:: Private residence Living Arrangements: Alone Available Help at Discharge: Family;Available PRN/intermittently Type of Home: House Home Access: Stairs to enter Entrance Stairs-Rails: Left Entrance Stairs-Number of Steps: 5 Home Layout: One level Home Equipment: Walker - 2 wheels;Cane - single point      Prior Function Level of Independence: Independent         Comments: Drives. Pt furniture walker at home.     Hand Dominance   Dominant Hand: Right    Extremity/Trunk Assessment   Upper Extremity Assessment Upper Extremity Assessment: Defer to OT evaluation    Lower Extremity Assessment Lower Extremity Assessment: LLE deficits/detail;Generalized weakness LLE Deficits / Details: wound on left lateral heel  Cervical / Trunk Assessment Cervical / Trunk Assessment: Kyphotic  Communication   Communication: No difficulties   Cognition Arousal/Alertness: Awake/alert Behavior During Therapy: WFL for tasks assessed/performed Overall Cognitive Status: Within Functional Limits for tasks assessed                                 General Comments: for basic mobility tasks      General Comments General comments (skin integrity, edema, etc.): Family present during session.    Exercises     Assessment/Plan    PT Assessment Patient needs continued PT services  PT Problem List Decreased strength;Decreased mobility;Cardiopulmonary status limiting activity;Pain;Decreased balance;Decreased activity tolerance       PT Treatment Interventions Functional mobility training;Balance training;Patient/family education;Gait training;Therapeutic activities;Stair training;Therapeutic exercise    PT Goals (Current goals can be found in the Care Plan section)  Acute Rehab PT Goals Patient Stated Goal: to feel better and go home PT Goal Formulation: With patient Time For Goal Achievement: 12/08/17 Potential to Achieve Goals: Good    Frequency Min 3X/week   Barriers to discharge Decreased caregiver support lives alone    Co-evaluation               AM-PAC PT "6 Clicks" Daily Activity  Outcome Measure Difficulty turning over in bed (including adjusting bedclothes, sheets and blankets)?: A Little Difficulty moving from lying on back to sitting on the side of the bed? : A Little Difficulty sitting down on and standing up from a chair with arms (e.g., wheelchair, bedside commode, etc,.)?: Unable Help needed moving to and from a bed to chair (including a wheelchair)?: A Little Help needed walking in hospital room?: A Little Help needed climbing 3-5 steps with a railing? : A Lot 6 Click Score: 15    End of Session Equipment Utilized During Treatment: Gait belt Activity Tolerance: Patient tolerated treatment well Patient left: in chair;with call bell/phone within reach;with family/visitor  present Nurse Communication: Mobility status PT Visit Diagnosis: Muscle weakness (generalized) (M62.81);Unsteadiness on feet (R26.81)    Time: 5681-2751 PT Time Calculation (min) (ACUTE ONLY): 32 min   Charges:   PT Evaluation $PT Eval Moderate Complexity: 1 Mod PT Treatments $Gait Training: 8-22 mins        Wray Kearns, PT, DPT Acute Rehabilitation Services Pager [phone] Office [place] 11/24/2017, 4:16 PM

## 2017-11-24 NOTE — Progress Notes (Addendum)
Initial Nutrition Assessment  DOCUMENTATION CODES:   Not applicable  INTERVENTION:    Ensure Enlive po BID, each supplement provides 350 kcal and 20 grams of protein  NUTRITION DIAGNOSIS:   Increased nutrient needs related to acute illness, wound healing as evidenced by estimated needs.  GOAL:   Patient will meet greater than or equal to 90% of their needs  MONITOR:   PO intake, Supplement acceptance, Labs, Weight trends, Skin, I & O's  REASON FOR ASSESSMENT:   Malnutrition Screening Tool  ASSESSMENT:   84 y.o. Female with PMH of aortic stenosis, a-fib, HTN, skin cancer and TIA who presented to the ED for shortness of breath.   Pt admitted with community acquired PNA.  Pt working with PT upon visit today. Family waiting outside of pt's room. RD spoke with family members. Report pt's appetite isn't very good. Pt did drink some of an Ensure Enlive supplement today.  Pt lives alone so she doesn't cook and/or prepare meals for herself often. Family also report pt has been losing weight over the last year. Unable to quantify amount. Labs & medications reviewed.  NUTRITION - FOCUSED PHYSICAL EXAM:  Unable to complete at this time.  Diet Order:   Diet Order            Diet Heart Room service appropriate? Yes; Fluid consistency: Thin  Diet effective now             EDUCATION NEEDS:   Not appropriate for education at this time  Skin:  Skin Assessment: Skin Integrity Issues: Skin Integrity Issues:: Other (Comment) Other: surgical excision melanoma to left lateral malleolus site  Last BM:  10/31  Height:   Ht Readings from Last 1 Encounters:  11/24/17 5\' 7"  (1.702 m)   Weight:   Wt Readings from Last 1 Encounters:  11/24/17 59.9 kg   BMI:  Body mass index is 20.68 kg/m.  Estimated Nutritional Needs:   Kcal:  1200-1400  Protein:  55-70 gm  Fluid:  >/= 1.5 L  Arthur Holms, RD, LDN Pager #: [phone] After-Hours Pager #: [phone]

## 2017-11-24 NOTE — Progress Notes (Signed)
[place] for Coumadin Indication: atrial fibrillation  Allergies  Allergen Reactions  . Valium [Diazepam] Other (See Comments)    Stopped breathing    Vital Signs: Temp: 98.3 F (36.8 C) (11/01 0806) Temp Source: Oral (11/01 0806) BP: 137/84 (11/01 0806) Pulse Rate: 70 (11/01 0806)  Labs: Recent Labs    11/23/17 1446 11/23/17 1645 11/23/17 2220 11/24/17 0221  HGB 13.9  --   --  12.0  HCT 43.7  --   --  37.3  PLT 113*  --   --  93*  LABPROT  --   --  26.2* 25.5*  INR  --   --  2.45 2.36  CREATININE 1.41*  --   --  1.27*  TROPONINI  --  0.04* 0.05*  --     Assessment: 84yo female c/o SOB, admitted w/ CAP, to continue Coumadin for Afib.  INR is 2.36 and at goal. No bleeding noted, Hgb stable, platelets are low at 93.  Goal of Therapy:  INR 2-3   Plan:  Coumadin 3 mg PO daily (as per home dose) Daily INR Monitor for s/sx of bleeding   Renold Genta, PharmD, BCPS Clinical Pharmacist Clinical phone for 11/24/2017 until 3p is [phone] Please check AMION for all Pharmacist numbers by unit 11/24/2017 8:28 AM

## 2017-11-24 NOTE — Progress Notes (Signed)
[facility] PROGRESS NOTE    Progress Note  Julie Mosley  LOV:[id number] DOB: 08/24/1926 DOA: 11/23/2017 PCP: Dineen Kid, MD     Brief Narrative:   Julie Mosley is an 84 y.o. female past medical history of atrial fibrillation on Coumadin, TIA moderate to severe aortic stenosis, left bundle branch block, melanoma surgically removed who presents to the ED with shortness of breath, with fever and chest x-ray showing billateral pulmonary infiltrates, who has failed outpatient antibiotic treatment  Assessment/Plan:   Community acquired pneumonia: Failed outpatient treatment with antibiotics week prior to admission. She continues to require oxygen. Started empirically on IV Rocephin and azithromycin. Supplemental oxygen.  Did not have an elevated white blood cell count on admission but is slowly coming down.  Acute kidney injury: With a baseline creatinine of less than 1, on admission 1.4. Likely prerenal azotemia improving with IV fluid hydration.  Getting a slowly trending now repeat a basic metabolic panel in the morning. Continue to hold ACE inhibitor and diuretics.  Essential hypertension: Controlled continue current management. Hold ACE inhibitor and diuretics  Chronic A-fib (HCC) CHADSVASc 6. Rate controlled Coumadin per pharmacy.  Open wound of left ankle She reports this is due to her recent surgical removal of her melanoma. Consult wound care.  Hypokalemia: Replete orally now resolved.  Mild elevation in cardiac biomarkers: The setting of an infectious etiology and moderate aortic stenosis they have remained flat she denies any chest pain.  Likely demand ischemia.  Pulmonary nodule seen on CT: There was a nodule seen on CT on 12/23/2016 was 0.7 cm. Repeat CT scan of the chest  Pressure injury of skin    DVT prophylaxis: coumadin Family Communication:none Disposition Plan/Barrier to D/C: home in 1-2 days Code Status:     Code Status Orders  (From  admission, onward)         Start     Ordered   11/23/17 1854  Do not attempt resuscitation (DNR)  Continuous    Question Answer Comment  In the event of cardiac or respiratory ARREST Do not call a "code blue"   In the event of cardiac or respiratory ARREST Do not perform Intubation, CPR, defibrillation or ACLS   In the event of cardiac or respiratory ARREST Use medication by any route, position, wound care, and other measures to relive pain and suffering. May use oxygen, suction and manual treatment of airway obstruction as needed for comfort.      11/23/17 1856        Code Status History    This patient has a current code status but no historical code status.    Advance Directive Documentation     Most Recent Value  Type of Advance Directive  Living will  Pre-existing out of facility DNR order (yellow form or pink MOST form)  -  "MOST" Form in Place?  -        IV Access:    Peripheral IV   Procedures and diagnostic studies:   Dg Chest 2 View  Result Date: 11/23/2017 CLINICAL DATA:  Pneumonia.  Cough and shortness of breath. EXAM: CHEST - 2 VIEW COMPARISON:  November 16, 2017 FINDINGS: Patchy infiltrates in the lungs, right greater than left, similar on the right in the interval and mildly more pronounced on the left. Cardiomegaly. The hila and mediastinum are normal. No pneumothorax. No pulmonary nodules or masses. IMPRESSION: Persistent bilateral pulmonary infiltrates, similar on the right in the interval at and mildly more prominent on  the left. Recommend treatment with short-term follow-up to ensure resolution. Electronically Signed   By: Dorise Bullion III M.D   On: 11/23/2017 15:43     Medical Consultants:    None.  Anti-Infectives:   Rocephin and azithromycin.  Subjective:    Julie Mosley she relates she continues to have persistent cough does not feel well he retires with malaise  Objective:    Vitals:   11/23/17 1945 11/23/17 2105 11/23/17 2319  11/24/17 0806  BP: 137/79 (!) 144/88 (!) 142/85 137/84  Pulse: 82 84 82 70  Resp: 19 10 12    Temp:  97.7 F (36.5 C) 97.7 F (36.5 C) 98.3 F (36.8 C)  TempSrc:  Oral Oral Oral  SpO2: 96% 94% 96% 94%    Intake/Output Summary (Last 24 hours) at 11/24/2017 0810 Last data filed at 11/23/2017 2127 Gross per 24 hour  Intake 100 ml  Output 50 ml  Net 50 ml   There were no vitals filed for this visit.  Exam: General exam: In no acute distress. Respiratory system: Good air movement and clear to auscultation. Cardiovascular system: S1 & S2 heard, RRR.  Gastrointestinal system: Abdomen is nondistended, soft and nontender.  Central nervous system: Alert and oriented. No focal neurological deficits. Extremities: No pedal edema. Skin: No rashes, lesions or ulcers Psychiatry: Judgement and insight appear normal. Mood & affect appropriate.    Data Reviewed:    Labs: Basic Metabolic Panel: Recent Labs  Lab 11/23/17 1446 11/24/17 0221  NA 138 139  K 3.4* 3.9  CL 100 105  CO2 29 29  GLUCOSE 101* 98  BUN 38* 35*  CREATININE 1.41* 1.27*  CALCIUM 9.9 8.7*   GFR CrCl cannot be calculated (Unknown ideal weight.). Liver Function Tests: No results for input(s): AST, ALT, ALKPHOS, BILITOT, PROT, ALBUMIN in the last 168 hours. No results for input(s): LIPASE, AMYLASE in the last 168 hours. No results for input(s): AMMONIA in the last 168 hours. Coagulation profile Recent Labs  Lab 11/23/17 2220 11/24/17 0221  INR 2.45 2.36    CBC: Recent Labs  Lab 11/23/17 1446 11/24/17 0221  WBC 9.5 7.4  NEUTROABS 7.1  --   HGB 13.9 12.0  HCT 43.7 37.3  MCV 97.3 96.4  PLT 113* 93*   Cardiac Enzymes: Recent Labs  Lab 11/23/17 1645 11/23/17 2220  TROPONINI 0.04* 0.05*   BNP (last 3 results) No results for input(s): PROBNP in the last 8760 hours. CBG: No results for input(s): GLUCAP in the last 168 hours. D-Dimer: No results for input(s): DDIMER in the last 72 hours. Hgb  A1c: No results for input(s): HGBA1C in the last 72 hours. Lipid Profile: No results for input(s): CHOL, HDL, LDLCALC, TRIG, CHOLHDL, LDLDIRECT in the last 72 hours. Thyroid function studies: No results for input(s): TSH, T4TOTAL, T3FREE, THYROIDAB in the last 72 hours.  Invalid input(s): FREET3 Anemia work up: No results for input(s): VITAMINB12, FOLATE, FERRITIN, TIBC, IRON, RETICCTPCT in the last 72 hours. Sepsis Labs: Recent Labs  Lab 11/23/17 1446 11/24/17 0221  WBC 9.5 7.4   Microbiology No results found for this or any previous visit (from the past 240 hour(s)).   Medications:   . amLODipine  5 mg Oral Daily  . calcium-vitamin D  1 tablet Oral Daily  . feeding supplement (ENSURE ENLIVE)  237 mL Oral BID BM  . warfarin  3 mg Oral q1800  . Warfarin - Pharmacist Dosing Inpatient   Does not apply q1800   Continuous  Infusions: . azithromycin 500 mg (11/23/17 2051)  . cefTRIAXone (ROCEPHIN)  IV        LOS: 1 day   [place] [facility] Pager [phone]  *Please refer to [email or web address], password [email or web address] to get updated schedule on who will round on this patient, as hospitalists switch teams weekly. If 7PM-7AM, please contact night-coverage at [email or web address], password [email or web address] for any overnight needs.  11/24/2017, 8:10 AM

## 2017-11-24 NOTE — Care Management Note (Signed)
Case Management Note  Patient Details  Name: Julie Mosley MRN: [id number] Date of Birth: [birth year]  Subjective/Objective:    From home alone, CAP, she has PCP and medication coverage, she has walker , cane and 3 n 1 at home.  NCM offered choice, she chose [facility] for HHPT, referral given to [facility] with [facility], soc will begin 24 to 48 hrs post dc. Will need HHPT orders with face to face prior to dc.                 Action/Plan: DC home when ready.   Expected Discharge Date:  10/25/17               Expected Discharge Plan:  [place]  In-House Referral:     Discharge planning Services  CM Consult  Post Acute Care Choice:  Home Health Choice offered to:  Patient  DME Arranged:    DME Agency:     HH Arranged:  PT [place]:  [place]  Status of Service:  Completed, signed off  If discussed at [place] of Stay Meetings, dates discussed:    Additional Comments:  Zenon Mayo, RN 11/24/2017, 4:54 PM

## 2017-11-24 NOTE — Consult Note (Signed)
[place] Nurse wound consult note Reason for Consult: Surgical excision melanoma to left lateral malleolus site.  25% slough. Has been using Hydrofera Blue to this area.  Wound type: Surgical excision melanoma Pressure Injury POA: NA Measurement: 2 cm x 2.5 cm depth is noted as 0.3 cm but slough at proximal end obscures assessment of wound bed Wound bed:25% adherent gray slough 75% pale pink nongranulating tissue.  Drainage (amount, consistency, odor) Moderate serosanguinous  No odor Periwound:intact Dressing procedure/placement/frequency: Cleanse wound to left malleolus with NS.  Apply Santyl to wound bed. Cover with NS moist 2x2.  Cover with dry dressing, kerlix and tape.  Change daily.  Send medication home with patient.  Will not follow at this time.  Please re-consult if needed.  Domenic Moras MSN, RN, FNP-BC CWON Wound, Ostomy, Continence Nurse Pager [phone]

## 2017-11-25 DIAGNOSIS — I10 Essential (primary) hypertension: Secondary | ICD-10-CM

## 2017-11-25 DIAGNOSIS — J189 Pneumonia, unspecified organism: Principal | ICD-10-CM

## 2017-11-25 DIAGNOSIS — I4821 Permanent atrial fibrillation: Secondary | ICD-10-CM

## 2017-11-25 DIAGNOSIS — N179 Acute kidney failure, unspecified: Secondary | ICD-10-CM

## 2017-11-25 LAB — PROTIME-INR
INR: 2.2
PROTHROMBIN TIME: 24.1 s — AB (ref 11.4–15.2)

## 2017-11-25 LAB — BASIC METABOLIC PANEL
Anion gap: 5 (ref 5–15)
BUN: 29 mg/dL — AB (ref 8–23)
CALCIUM: 8.5 mg/dL — AB (ref 8.9–10.3)
CHLORIDE: 109 mmol/L (ref 98–111)
CO2: 26 mmol/L (ref 22–32)
CREATININE: 1.09 mg/dL — AB (ref 0.44–1.00)
GFR calc non Af Amer: 44 mL/min — ABNORMAL LOW (ref >60)
GFR, EST AFRICAN AMERICAN: 51 mL/min — AB (ref >60)
Glucose, Bld: 98 mg/dL (ref 70–99)
Potassium: 3.8 mmol/L (ref 3.5–5.1)
Sodium: 140 mmol/L (ref 135–145)

## 2017-11-25 MED ORDER — AZITHROMYCIN 250 MG PO TABS
500.0000 mg | ORAL_TABLET | Freq: Every day | ORAL | Status: DC
  Administered 2017-11-25 – 2017-11-27 (×3): 500 mg via ORAL
  Filled 2017-11-25 (×4): qty 2

## 2017-11-25 NOTE — Progress Notes (Signed)
[place] for Coumadin Indication: atrial fibrillation  Allergies  Allergen Reactions  . Valium [Diazepam] Other (See Comments)    Stopped breathing    Vital Signs: Temp: 98.4 F (36.9 C) (11/02 0806) Temp Source: Oral (11/02 0806) BP: 123/81 (11/02 0806) Pulse Rate: 77 (11/02 0806)  Labs: Recent Labs    11/23/17 1446 11/23/17 1645 11/23/17 2220 11/24/17 0221 11/25/17 0249  HGB 13.9  --   --  12.0  --   HCT 43.7  --   --  37.3  --   PLT 113*  --   --  93*  --   LABPROT  --   --  26.2* 25.5* 24.1*  INR  --   --  2.45 2.36 2.20  CREATININE 1.41*  --   --  1.27* 1.09*  TROPONINI  --  0.04* 0.05*  --   --     Assessment: 84yo female c/o SOB, admitted w/ CAP, to continue Coumadin for Afib.  INR is 2.20 and at goal. No bleeding noted, Hgb stable, platelets are low at 93.  Goal of Therapy:  INR 2-3   Plan:  Coumadin 3 mg PO daily (as per home dose) Daily INR Monitor for s/sx of bleeding  Vertis Kelch, PharmD PGY1 Pharmacy Resident Phone [phone] 11/25/2017       9:40 AM

## 2017-11-25 NOTE — Progress Notes (Signed)
PROGRESS NOTE  Julie Mosley IWL:[id number] DOB: [birth year] DOA: 11/23/2017 PCP: Dineen Kid, MD   LOS: 2 days   Brief Narrative / Interim history: 84 year old female with history of A. fib on Coumadin, prior TIAs, severe aortic stenosis, left bundle branch block, left lower extremity melanoma surgically removed recently who was admitted on 11/23/2017 with progressive fever/shortness of breath and chest x-ray showing bilateral pulmonary infiltrates, after failing outpatient antibiotic therapy for the past week.  Subjective: -Complains of persistent shortness of breath, appears somewhat tired on my evaluation.  Has ongoing malaise and low energy.  She appreciates overall that she is improving compared to couple of days ago  Assessment & Plan: Principal Problem:   Community acquired pneumonia Active Problems:   Hypertension   A-fib ([place])   Open wound of left ankle   Hypokalemia   AKI (acute kidney injury) ([place])   Pulmonary nodule less than 6 cm determined by computed tomography of lung   Pressure injury of skin   Community acquired pneumonia -Patient admitted to the hospital for persistent multifocal pneumonia after failing outpatient antibiotics.  She was placed on ceftriaxone and azithromycin, cultures have remained negative.  Continue antibiotics for now.  Acute kidney injury -Improved with IV fluids, creatinine 1.0 this morning.  Continue to hold ACE inhibitor and diuretics for today.  Essential hypertension -Controlled, hold ACE inhibitor and diuretics for now  Chronic A-fib (HCC) -CHADSVASc 6, rate controlled, Coumadin per pharmacy.  Open wound of left ankle -She reports this is due to her recent surgical removal of her melanoma. Consult wound care.  Hypokalemia -Replete orally now resolved.  Mild elevation in cardiac biomarkers -The setting of an infectious etiology and moderate aortic stenosis they have remained flat she denies any chest pain.  Likely demand  ischemia.   Pressure injury of skin  Scheduled Meds: . amLODipine  5 mg Oral Daily  . azithromycin  500 mg Oral Daily  . calcium-vitamin D  1 tablet Oral Daily  . collagenase   Topical Daily  . feeding supplement (ENSURE ENLIVE)  237 mL Oral BID BM  . warfarin  3 mg Oral q1800  . Warfarin - Pharmacist Dosing Inpatient   Does not apply q1800   Continuous Infusions: . cefTRIAXone (ROCEPHIN)  IV 1 g (11/25/17 0852)   PRN Meds:.acetaminophen, lip balm  DVT prophylaxis: Coumadin Code Status: DNR Family Communication: No family present at bedside Disposition Plan: Home when ready  Consultants:   None  Procedures:   None   Antimicrobials:  Ceftriaxone / azithromycin  Objective: Vitals:   11/24/17 1339 11/24/17 1701 11/24/17 2341 11/25/17 0806  BP:  116/75 127/74 123/81  Pulse:  87 78 77  Resp:   16 16  Temp:  98.2 F (36.8 C) 98.5 F (36.9 C) 98.4 F (36.9 C)  TempSrc:  Oral Oral Oral  SpO2:  94% 93% 94%  Weight: 59.9 kg     Height: 5\' 7"  (1.702 m)       Intake/Output Summary (Last 24 hours) at 11/25/2017 1059 Last data filed at 11/24/2017 2046 Gross per 24 hour  Intake 430 ml  Output 200 ml  Net 230 ml   Filed Weights   11/24/17 1339  Weight: 59.9 kg    Examination:  Constitutional: No distress but appears tachypneic on my evaluation Eyes: No scleral icterus ENMT: Mucous membranes are moist. No oropharyngeal exudates Neck: normal, supple, no masses, no thyromegaly Respiratory: Loud rhonchi and coarse breath sounds heard on the right lung field,  no wheezing or crackles.  Slightly increased inspiratory effort Cardiovascular: Irregular, 3/6 SEM, no edema Abdomen: no tenderness. Bowel sounds positive.  Musculoskeletal: no clubbing / cyanosis.  Skin: no rashes, lesions, ulcers. No induration Neurologic: CN 2-12 grossly intact. Strength 5/5 in all 4.  Psychiatric: Normal judgment and insight. Alert and oriented x 3. Normal mood.    Data Reviewed: I  have independently reviewed following labs and imaging studies   CBC: Recent Labs  Lab 11/23/17 1446 11/24/17 0221  WBC 9.5 7.4  NEUTROABS 7.1  --   HGB 13.9 12.0  HCT 43.7 37.3  MCV 97.3 96.4  PLT 113* 93*   Basic Metabolic Panel: Recent Labs  Lab 11/23/17 1446 11/24/17 0221 11/25/17 0249  NA 138 139 140  K 3.4* 3.9 3.8  CL 100 105 109  CO2 29 29 26   GLUCOSE 101* 98 98  BUN 38* 35* 29*  CREATININE 1.41* 1.27* 1.09*  CALCIUM 9.9 8.7* 8.5*   GFR: Estimated Creatinine Clearance: 33.1 mL/min (A) (by C-G formula based on SCr of 1.09 mg/dL (H)). Liver Function Tests: No results for input(s): AST, ALT, ALKPHOS, BILITOT, PROT, ALBUMIN in the last 168 hours. No results for input(s): LIPASE, AMYLASE in the last 168 hours. No results for input(s): AMMONIA in the last 168 hours. Coagulation Profile: Recent Labs  Lab 11/23/17 2220 11/24/17 0221 11/25/17 0249  INR 2.45 2.36 2.20   Cardiac Enzymes: Recent Labs  Lab 11/23/17 1645 11/23/17 2220  TROPONINI 0.04* 0.05*   BNP (last 3 results) No results for input(s): PROBNP in the last 8760 hours. HbA1C: No results for input(s): HGBA1C in the last 72 hours. CBG: No results for input(s): GLUCAP in the last 168 hours. Lipid Profile: No results for input(s): CHOL, HDL, LDLCALC, TRIG, CHOLHDL, LDLDIRECT in the last 72 hours. Thyroid Function Tests: No results for input(s): TSH, T4TOTAL, FREET4, T3FREE, THYROIDAB in the last 72 hours. Anemia Panel: No results for input(s): VITAMINB12, FOLATE, FERRITIN, TIBC, IRON, RETICCTPCT in the last 72 hours. Urine analysis:    Component Value Date/Time   COLORURINE YELLOW 11/23/2017 1727   APPEARANCEUR HAZY (A) 11/23/2017 1727   LABSPEC 1.017 11/23/2017 1727   PHURINE 6.0 11/23/2017 1727   GLUCOSEU NEGATIVE 11/23/2017 1727   HGBUR NEGATIVE 11/23/2017 [place] 11/23/2017 1727   KETONESUR NEGATIVE 11/23/2017 1727   PROTEINUR 100 (A) 11/23/2017 1727    UROBILINOGEN 1.0 04/04/2012 0227   NITRITE NEGATIVE 11/23/2017 1727   LEUKOCYTESUR TRACE (A) 11/23/2017 1727   Sepsis Labs: Invalid input(s): PROCALCITONIN, LACTICIDVEN  No results found for this or any previous visit (from the past 240 hour(s)).    Radiology Studies: Dg Chest 2 View  Result Date: 11/23/2017 CLINICAL DATA:  Pneumonia.  Cough and shortness of breath. EXAM: CHEST - 2 VIEW COMPARISON:  November 16, 2017 FINDINGS: Patchy infiltrates in the lungs, right greater than left, similar on the right in the interval and mildly more pronounced on the left. Cardiomegaly. The hila and mediastinum are normal. No pneumothorax. No pulmonary nodules or masses. IMPRESSION: Persistent bilateral pulmonary infiltrates, similar on the right in the interval at and mildly more prominent on the left. Recommend treatment with short-term follow-up to ensure resolution. Electronically Signed   By: Dorise Bullion III M.D   On: 11/23/2017 15:43   Ct Chest Wo Contrast  Result Date: 11/24/2017 CLINICAL DATA:  Follow-up lung nodule.  No chest complaints. EXAM: CT CHEST WITHOUT CONTRAST TECHNIQUE: Multidetector CT imaging of the chest was performed  following the standard protocol without IV contrast. COMPARISON:  12/23/2016 FINDINGS: Cardiovascular: The heart is enlarged. Coronary artery calcifications are present. There is calcification dilatation of the aortic root, measuring 5.1 centimeters (previously 4.5 centimeters. Ascending aorta is 5.1 centimeters, previously 5.0 centimeters. Aortic arch is 3.2 centimeters. Previously 3.1 centimeters. Distal arch is 3.2 centimeters, previously 3.0 centimeters. Descending thoracic aorta is 3.0 centimeters, stable. At the level of the diaphragmatic hiatus, the aorta is 2.8 centimeters, previously 2.6 centimeters. No evidence for displaced intimal calcifications. Mediastinum/Nodes: There is high attenuation material within the contrast, consistent reflux or poor peristalsis. No  obstructing mass is identified. No mediastinal, hilar, or axillary adenopathy. Lungs/Pleura: There are numerous ground-glass opacities throughout the lungs bilaterally. These areas are discrete in the UPPER lobes and confluent in the LOWER lobes. These ground-glass opacities obscure the nodules previously described and is difficult to assess for new pulmonary nodules given the presence of significant lung opacities which are likely inflammatory/infectious. No pleural effusion. Upper Abdomen: Stable appearance of low-attenuation lesion within the RIGHT kidney. Musculoskeletal: Midthoracic spondylosis. IMPRESSION: 1. Ascending aortic aneurysm. Ascending aorta now measures 5.1 centimeters, previously 5.0 centimeters. Aortic root is 5.1 centimeters, previously 4.5 centimeters. Ascending thoracic aortic aneurysm. Recommend semi-annual imaging followup by CTA or MRA and referral to cardiothoracic surgery if not already obtained. This recommendation follows 2010 ACCF/AHA/AATS/ACR/ASA/SCA/SCAI/SIR/STS/SVM Guidelines for the Diagnosis and Management of Patients With Thoracic Aortic Disease. Circulation. 2010; 121: I786-V672 2. New bilateral areas of ground-glass opacities and associated air bronchograms throughout the lungs bilaterally, confluent at the bases. Findings are consistent with infectious process. Follow-up CT of the chest is recommended in 3 months. 3. Debris within the esophagus consistent with poor peristalsis/obstruction, or reflux. 4. Cyst within the UPPER pole of the RIGHT kidney, stable in appearance. Electronically Signed   By: Nolon Nations M.D.   On: 11/24/2017 09:33    Marzetta Board, MD, PhD [facility] Pager [phone]  If 7PM-7AM, please contact night-coverage [email or web address] Password [email or web address] 11/25/2017, 10:59 AM

## 2017-11-25 NOTE — Progress Notes (Signed)
Physical Therapy Treatment Patient Details Name: Julie Mosley MRN: [id number] DOB: 28-Aug-1926 Today's Date: 11/25/2017    History of Present Illness Patient is a 84 y/o female who presents with SOB, fever and cough, recently diagnosed with PNA but failed outpatient treatment. CXR-showing billateral pulmonary infiltrates. PMH includes A-fib on Coumadin, TIA, aortic stenosis, LBBB and melanoma surgically removed.    PT Comments    Patient progressing well with therapy today, supervision level for mobility at this time. Cues for use of RW for increased safety and focus on stair training for home entry. Pt could benefit from another session of stairs to ensure safe home entry, as she reports feeling weaker than baseline. HHPT for reconditioning.     Follow Up Recommendations  Home health PT;Supervision for mobility/OOB;Supervision/Assistance - 24 hour     Equipment Recommendations  None recommended by PT    Recommendations for Other Services       Precautions / Restrictions Precautions Precautions: Fall Restrictions Weight Bearing Restrictions: No    Mobility  Bed Mobility Overal bed mobility: Modified Independent                Transfers Overall transfer level: Needs assistance Equipment used: Rolling walker (2 wheeled) Transfers: Sit to/from Stand Sit to Stand: Supervision            Ambulation/Gait Ambulation/Gait assistance: Supervision Gait Distance (Feet): 50 Feet Assistive device: Rolling walker (2 wheeled) Gait Pattern/deviations: Step-through pattern;Decreased stride length;Drifts right/left;Narrow base of support Gait velocity: decreased   General Gait Details: safer with walker, cues for proper use and proximity   Stairs Stairs: Yes Stairs assistance: Min guard;Supervision Stair Management: One rail Left;Step to pattern;Sideways Number of Stairs: 12 General stair comments: extensive cues for safety and use of 1 rail with BUE support side  stepping with proper foot placement. after 1 trial,  able to demonstrate safety for home entry without physical assistance.   Wheelchair Mobility    Modified Rankin (Stroke Patients Only)       Balance Overall balance assessment: Needs assistance;History of Falls Sitting-balance support: Feet supported;No upper extremity supported Sitting balance-Leahy Scale: Good     Standing balance support: During functional activity;Single extremity supported Standing balance-Leahy Scale: Poor                              Cognition Arousal/Alertness: Awake/alert Behavior During Therapy: WFL for tasks assessed/performed Overall Cognitive Status: Within Functional Limits for tasks assessed                                 General Comments: for basic mobility tasks      Exercises      General Comments        Pertinent Vitals/Pain Pain Assessment: Faces Faces Pain Scale: Hurts a little bit Pain Location: left foot- little toe Pain Descriptors / Indicators: Sore Pain Intervention(s): Limited activity within patient's tolerance;Monitored during session    Home Living                      Prior Function            PT Goals (current goals can now be found in the care plan section) Acute Rehab PT Goals Patient Stated Goal: to feel better and go home PT Goal Formulation: With patient Time For Goal Achievement: 12/08/17 Potential to Achieve Goals: Good Progress  towards PT goals: Progressing toward goals    Frequency    Min 3X/week      PT Plan      Co-evaluation              AM-PAC PT "6 Clicks" Daily Activity  Outcome Measure  Difficulty turning over in bed (including adjusting bedclothes, sheets and blankets)?: A Little Difficulty moving from lying on back to sitting on the side of the bed? : A Little Difficulty sitting down on and standing up from a chair with arms (e.g., wheelchair, bedside commode, etc,.)?: Unable Help  needed moving to and from a bed to chair (including a wheelchair)?: A Little Help needed walking in hospital room?: A Little Help needed climbing 3-5 steps with a railing? : A Lot 6 Click Score: 15    End of Session Equipment Utilized During Treatment: Gait belt Activity Tolerance: Patient tolerated treatment well Patient left: in chair;with call bell/phone within reach;with family/visitor present Nurse Communication: Mobility status PT Visit Diagnosis: Muscle weakness (generalized) (M62.81);Unsteadiness on feet (R26.81)     Time: 8677-3736 PT Time Calculation (min) (ACUTE ONLY): 25 min  Charges:  $Gait Training: 23-37 mins                     Reinaldo Berber, PT, DPT Acute Rehabilitation Services Pager: [phone] Office: [phone]     Reinaldo Berber 11/25/2017, 11:32 AM

## 2017-11-26 LAB — BASIC METABOLIC PANEL
ANION GAP: 3 — AB (ref 5–15)
BUN: 25 mg/dL — ABNORMAL HIGH (ref 8–23)
CO2: 26 mmol/L (ref 22–32)
Calcium: 8.2 mg/dL — ABNORMAL LOW (ref 8.9–10.3)
Chloride: 110 mmol/L (ref 98–111)
Creatinine, Ser: 1.03 mg/dL — ABNORMAL HIGH (ref 0.44–1.00)
GFR calc non Af Amer: 47 mL/min — ABNORMAL LOW (ref >60)
GFR, EST AFRICAN AMERICAN: 54 mL/min — AB (ref >60)
Glucose, Bld: 102 mg/dL — ABNORMAL HIGH (ref 70–99)
Potassium: 3.7 mmol/L (ref 3.5–5.1)
SODIUM: 139 mmol/L (ref 135–145)

## 2017-11-26 LAB — LEGIONELLA PNEUMOPHILA SEROGP 1 UR AG: L. pneumophila Serogp 1 Ur Ag: NEGATIVE

## 2017-11-26 LAB — PROTIME-INR
INR: 2.32
PROTHROMBIN TIME: 25.1 s — AB (ref 11.4–15.2)

## 2017-11-26 NOTE — Progress Notes (Signed)
PROGRESS NOTE  Julie Mosley ERD:[id number] DOB: [birth year] DOA: 11/23/2017 PCP: Dineen Kid, MD   LOS: 3 days   Brief Narrative / Interim history: 84 year old female with history of A. fib on Coumadin, prior TIAs, severe aortic stenosis, left bundle branch block, left lower extremity melanoma surgically removed recently who was admitted on 11/23/2017 with progressive fever/shortness of breath and chest x-ray showing bilateral pulmonary infiltrates, after failing outpatient antibiotic therapy for the past week.  Subjective: -Feels better, however she is complaining of left knee pain today.  Her left knee has been bothering her on and off for years.  Feels overall quite weak.  Patient directly observed when working with physical therapy, she appears extremely weak and tachypneic with decreased activity tolerance compared to yesterday.  She has a productive cough  Assessment & Plan: Principal Problem:   Community acquired pneumonia Active Problems:   Hypertension   A-fib ([place])   Open wound of left ankle   Hypokalemia   AKI (acute kidney injury) ([place])   Pulmonary nodule less than 6 cm determined by computed tomography of lung   Pressure injury of skin   Community acquired pneumonia -Patient admitted to the hospital for persistent multifocal pneumonia after failing outpatient antibiotics.  She was placed on ceftriaxone and azithromycin, cultures have remained negative.  Continue antibiotics for now. -Has persistent cough and very coarse breath sounds.  Could not do as much therapy today, I have discussed with PT and they recommend short-term rehab/SNF at this point.  This is very reasonable given prior level of functioning as she lived independently in her home, and current acute weakness in the setting of pneumonia.  Consulted Education officer, [employer]  Acute kidney injury -Creatinine has remained stable, continue to hold ACE inhibitors and diuretics, hold fluids as well and allow p.o.  intake  Essential hypertension -Controlled, continue to hold ACE inhibitor and diuretics for now,?  Whether she would need to on discharge  Chronic A-fib ([place]) -CHADSVASc 6, rate controlled, Coumadin per pharmacy.  Open wound of left ankle -She reports this is due to her recent surgical removal of her melanoma.  Continue wound care  Hypokalemia -Replete orally now resolved.  Mild elevation in cardiac biomarkers -The setting of an infectious etiology and moderate aortic stenosis they have remained flat she denies any chest pain.  Likely demand ischemia.   Pressure injury of skin  Scheduled Meds: . amLODipine  5 mg Oral Daily  . azithromycin  500 mg Oral Daily  . calcium-vitamin D  1 tablet Oral Daily  . collagenase   Topical Daily  . feeding supplement (ENSURE ENLIVE)  237 mL Oral BID BM  . warfarin  3 mg Oral q1800  . Warfarin - Pharmacist Dosing Inpatient   Does not apply q1800   Continuous Infusions: . cefTRIAXone (ROCEPHIN)  IV 1 g (11/25/17 0852)   PRN Meds:.acetaminophen, lip balm  DVT prophylaxis: Coumadin Code Status: DNR Family Communication: No family present at bedside, discussed with son yesterday afternoon Disposition Plan: SNF when bed available,  Consultants:   None  Procedures:   None   Antimicrobials:  Ceftriaxone / azithromycin  Objective: Vitals:   11/25/17 0806 11/25/17 1626 11/25/17 2325 11/26/17 0729  BP: 123/81 105/76 113/78 122/77  Pulse: 77 86 79 83  Resp: 16 18 16 16   Temp: 98.4 F (36.9 C) 98.4 F (36.9 C) 98.6 F (37 C) 98.1 F (36.7 C)  TempSrc: Oral Oral Oral Oral  SpO2: 94% 92% 92% 96%  Weight:  Height:       No intake or output data in the 24 hours ending 11/26/17 1041 Filed Weights   11/24/17 1339  Weight: 59.9 kg    Examination:  Constitutional: No distress but interview often interrupted by productive wet sounding cough Eyes: No icterus ENMT: Moist mixed membranes Respiratory: Persistent rhonchi on  the right lung field, no wheezing, no crackles, increased respiratory effort Cardiovascular: Irregular, 3/6 SEM, no edema Abdomen: Soft, nontender, nondistended, positive bowel sounds Skin: No rashes seen Neurologic: Nonfocal, sensation intact, equal strength Psychiatric: Alert and oriented x3  Data Reviewed: I have independently reviewed following labs and imaging studies   CBC: Recent Labs  Lab 11/23/17 1446 11/24/17 0221  WBC 9.5 7.4  NEUTROABS 7.1  --   HGB 13.9 12.0  HCT 43.7 37.3  MCV 97.3 96.4  PLT 113* 93*   Basic Metabolic Panel: Recent Labs  Lab 11/23/17 1446 11/24/17 0221 11/25/17 0249 11/26/17 0250  NA 138 139 140 139  K 3.4* 3.9 3.8 3.7  CL 100 105 109 110  CO2 29 29 26 26   GLUCOSE 101* 98 98 102*  BUN 38* 35* 29* 25*  CREATININE 1.41* 1.27* 1.09* 1.03*  CALCIUM 9.9 8.7* 8.5* 8.2*   GFR: Estimated Creatinine Clearance: 35 mL/min (A) (by C-G formula based on SCr of 1.03 mg/dL (H)). Liver Function Tests: No results for input(s): AST, ALT, ALKPHOS, BILITOT, PROT, ALBUMIN in the last 168 hours. No results for input(s): LIPASE, AMYLASE in the last 168 hours. No results for input(s): AMMONIA in the last 168 hours. Coagulation Profile: Recent Labs  Lab 11/23/17 2220 11/24/17 0221 11/25/17 0249 11/26/17 0250  INR 2.45 2.36 2.20 2.32   Cardiac Enzymes: Recent Labs  Lab 11/23/17 1645 11/23/17 2220  TROPONINI 0.04* 0.05*   BNP (last 3 results) No results for input(s): PROBNP in the last 8760 hours. HbA1C: No results for input(s): HGBA1C in the last 72 hours. CBG: No results for input(s): GLUCAP in the last 168 hours. Lipid Profile: No results for input(s): CHOL, HDL, LDLCALC, TRIG, CHOLHDL, LDLDIRECT in the last 72 hours. Thyroid Function Tests: No results for input(s): TSH, T4TOTAL, FREET4, T3FREE, THYROIDAB in the last 72 hours. Anemia Panel: No results for input(s): VITAMINB12, FOLATE, FERRITIN, TIBC, IRON, RETICCTPCT in the last 72  hours. Urine analysis:    Component Value Date/Time   COLORURINE YELLOW 11/23/2017 1727   APPEARANCEUR HAZY (A) 11/23/2017 1727   LABSPEC 1.017 11/23/2017 1727   PHURINE 6.0 11/23/2017 1727   GLUCOSEU NEGATIVE 11/23/2017 1727   HGBUR NEGATIVE 11/23/2017 [place] 11/23/2017 1727   KETONESUR NEGATIVE 11/23/2017 1727   PROTEINUR 100 (A) 11/23/2017 1727   UROBILINOGEN 1.0 04/04/2012 0227   NITRITE NEGATIVE 11/23/2017 1727   LEUKOCYTESUR TRACE (A) 11/23/2017 1727   Sepsis Labs: Invalid input(s): PROCALCITONIN, LACTICIDVEN  No results found for this or any previous visit (from the past 240 hour(s)).    Radiology Studies: No results found.  Marzetta Board, MD, PhD [facility] Pager [phone]  If 7PM-7AM, please contact night-coverage [email or web address] Password [email or web address] 11/26/2017, 10:41 AM

## 2017-11-26 NOTE — Progress Notes (Signed)
[facility] for Coumadin Indication: atrial fibrillation  Allergies  Allergen Reactions  . Valium [Diazepam] Other (See Comments)    Stopped breathing    Vital Signs: Temp: 98.1 F (36.7 C) (11/03 0729) Temp Source: Oral (11/03 0729) BP: 122/77 (11/03 0729) Pulse Rate: 83 (11/03 0729)  Labs: Recent Labs    11/23/17 1446 11/23/17 1645  11/23/17 2220 11/24/17 0221 11/25/17 0249 11/26/17 0250  HGB 13.9  --   --   --  12.0  --   --   HCT 43.7  --   --   --  37.3  --   --   PLT 113*  --   --   --  93*  --   --   LABPROT  --   --    < > 26.2* 25.5* 24.1* 25.1*  INR  --   --    < > 2.45 2.36 2.20 2.32  CREATININE 1.41*  --   --   --  1.27* 1.09* 1.03*  TROPONINI  --  0.04*  --  0.05*  --   --   --    < > = values in this interval not displayed.    Assessment: 84yo female c/o SOB, admitted w/ CAP, to continue Coumadin for Afib.  INR is 2.32 and at goal. No bleeding noted, Hgb stable, platelets are low at 93.  Goal of Therapy:  INR 2-3   Plan:  Coumadin 3 mg PO daily (as per home dose) Daily INR Monitor for s/sx of bleeding  Vertis Kelch, PharmD PGY1 Pharmacy Resident Phone [phone] 11/26/2017       8:17 AM

## 2017-11-26 NOTE — Progress Notes (Signed)
Physical Therapy Treatment Patient Details Name: Julie Mosley MRN: [id number] DOB: Jul 19, 1926 Today's Date: 11/26/2017    History of Present Illness Patient is a 84 y/o female who presents with SOB, fever and cough, recently diagnosed with PNA but failed outpatient treatment. CXR-showing billateral pulmonary infiltrates. PMH includes A-fib on Coumadin, TIA, aortic stenosis, LBBB and melanoma surgically removed.    PT Comments    Patient demonstrated deceased activity tolerance from prior session, due in part to increase in knee pain. This has resulted in more supervision and guarding required and at this point I do not feel her living alone being unsupervised is the safest option. She is at increased risk of falls and will benefit from SNF to return to baseline. Pt demonstrates good cary over with cues and sequencing on stairs today, however not fully independent at this time.      Follow Up Recommendations  SNF;Supervision for mobility/OOB     Equipment Recommendations       Recommendations for Other Services       Precautions / Restrictions Precautions Precautions: Fall Restrictions Weight Bearing Restrictions: No    Mobility  Bed Mobility Overal bed mobility: Modified Independent Bed Mobility: Rolling;Sidelying to Sit              Transfers Overall transfer level: Needs assistance Equipment used: Rolling walker (2 wheeled) Transfers: Sit to/from Stand Sit to Stand: Supervision         General transfer comment: Min A to steady in standing, stood from EOB x3, urinary incontinence in standing. transferred to chair post ambulation.  Ambulation/Gait Ambulation/Gait assistance: Min guard Gait Distance (Feet): 50 Feet Assistive device: Rolling walker (2 wheeled) Gait Pattern/deviations: Step-through pattern;Decreased stride length;Drifts right/left;Narrow base of support Gait velocity: decreased   General Gait Details: safer with walker, cues for proper use and  proximity   Stairs Stairs: Yes Stairs assistance: Min guard Stair Management: One rail Left;Step to pattern;Sideways Number of Stairs: 5 General stair comments: pt with great cary over from prior PT visit due to increased knee pain requiring more supervision today.    Wheelchair Mobility    Modified Rankin (Stroke Patients Only)       Balance Overall balance assessment: Needs assistance;History of Falls Sitting-balance support: Feet supported;No upper extremity supported Sitting balance-Leahy Scale: Good     Standing balance support: During functional activity;Single extremity supported Standing balance-Leahy Scale: Poor                              Cognition Arousal/Alertness: Awake/alert Behavior During Therapy: WFL for tasks assessed/performed Overall Cognitive Status: Within Functional Limits for tasks assessed                                 General Comments: for basic mobility tasks      Exercises      General Comments        Pertinent Vitals/Pain Pain Assessment: Faces Faces Pain Scale: Hurts a little bit Pain Location: left foot- little toe Pain Descriptors / Indicators: Sore Pain Intervention(s): Limited activity within patient's tolerance;Monitored during session;Premedicated before session    Home Living                      Prior Function            PT Goals (current goals can now be found in the  care plan section) Acute Rehab PT Goals Patient Stated Goal: to feel better and go home PT Goal Formulation: With patient Time For Goal Achievement: 12/08/17 Potential to Achieve Goals: Good Progress towards PT goals: Progressing toward goals    Frequency    Min 3X/week      PT Plan Discharge plan needs to be updated    Co-evaluation              AM-PAC PT "6 Clicks" Daily Activity  Outcome Measure  Difficulty turning over in bed (including adjusting bedclothes, sheets and blankets)?: A  Little Difficulty moving from lying on back to sitting on the side of the bed? : A Little Difficulty sitting down on and standing up from a chair with arms (e.g., wheelchair, bedside commode, etc,.)?: Unable Help needed moving to and from a bed to chair (including a wheelchair)?: A Little Help needed walking in hospital room?: A Little Help needed climbing 3-5 steps with a railing? : A Lot 6 Click Score: 15    End of Session Equipment Utilized During Treatment: Gait belt Activity Tolerance: Patient tolerated treatment well Patient left: in chair;with call bell/phone within reach;with family/visitor present Nurse Communication: Mobility status PT Visit Diagnosis: Muscle weakness (generalized) (M62.81);Unsteadiness on feet (R26.81)     Time: 7001-7494 PT Time Calculation (min) (ACUTE ONLY): 16 min  Charges:  $Gait Training: 8-22 mins                     Reinaldo Berber, PT, DPT Acute Rehabilitation Services Pager: [phone] Office: [phone]     Reinaldo Berber 11/26/2017, 9:33 AM

## 2017-11-27 DIAGNOSIS — S91002D Unspecified open wound, left ankle, subsequent encounter: Secondary | ICD-10-CM

## 2017-11-27 LAB — BASIC METABOLIC PANEL
ANION GAP: 8 (ref 5–15)
BUN: 25 mg/dL — ABNORMAL HIGH (ref 8–23)
CALCIUM: 8.1 mg/dL — AB (ref 8.9–10.3)
CHLORIDE: 104 mmol/L (ref 98–111)
CO2: 25 mmol/L (ref 22–32)
Creatinine, Ser: 0.97 mg/dL (ref 0.44–1.00)
GFR calc Af Amer: 58 mL/min — ABNORMAL LOW (ref >60)
GFR calc non Af Amer: 50 mL/min — ABNORMAL LOW (ref >60)
GLUCOSE: 98 mg/dL (ref 70–99)
POTASSIUM: 3.7 mmol/L (ref 3.5–5.1)
Sodium: 137 mmol/L (ref 135–145)

## 2017-11-27 LAB — PROTIME-INR
INR: 2.37
PROTHROMBIN TIME: 25.6 s — AB (ref 11.4–15.2)

## 2017-11-27 MED ORDER — DOXYCYCLINE HYCLATE 100 MG PO CAPS: 100.0000 mg | ORAL_CAPSULE | Freq: Two times a day (BID) | ORAL | 0 refills | Status: DC

## 2017-11-27 NOTE — NC FL2 (Addendum)
[place] LEVEL OF CARE SCREENING TOOL     IDENTIFICATION  Patient Name: Julie Mosley Birthdate: 01-Jul-1926 Sex: female Admission Date (Current Location): 11/23/2017  [facility] and [place] Number:  Herbalist and Address:  [facility] [place]. [facility], [place] [address] [place], [place] [address]      Provider Number: [id number]  Attending Physician Name and Address:  Caren Griffins, MD  Relative Name and Phone Number:       Current Level of Care: Hospital Recommended Level of Care: [place] Prior Approval Number:    Date Approved/Denied:   PASRR Number: [id number]  Discharge Plan: SNF    Current Diagnoses: Patient Active Problem List   Diagnosis Date Noted  . Pressure injury of skin 11/24/2017  . Community acquired pneumonia 11/23/2017  . Open wound of left ankle 11/23/2017  . Hypokalemia 11/23/2017  . AKI (acute kidney injury) ([place]) 11/23/2017  . Pulmonary nodule less than 6 cm determined by computed tomography of lung 11/23/2017  . Aortic stenosis 12/22/2014  . Persistent atrial fibrillation 12/22/2014  . Postinflammatory pulmonary fibrosis ([place]) 01/15/2014  . Chronic anticoagulation 12/13/2012  . Mild aortic stenosis 12/13/2012  . Dilated aortic root ([place]) 12/13/2012  . A-fib ([place]) 04/14/2012  . History of stroke 04/14/2012  . Essential hypertension, benign 04/14/2012  . Other and unspecified hyperlipidemia 04/14/2012  . Senile osteoporosis 04/14/2012  . Uncontrolled pain 04/03/2012  . Neck pain 04/02/2012  . H/O: CVA (cerebrovascular accident) 04/02/2012  . Nausea 04/02/2012  . Hypertension   . TIA (transient ischemic attack)     Orientation RESPIRATION BLADDER Height & Weight     Time, Situation, Place, Self  Normal Incontinent Weight: 132 lb 0.9 oz (59.9 kg) Height:  5\' 7"  (170.2 cm)  BEHAVIORAL SYMPTOMS/MOOD NEUROLOGICAL BOWEL NUTRITION STATUS      Continent Diet(heart healthy-thin liquids)   AMBULATORY STATUS COMMUNICATION OF NEEDS Skin   Limited Assist Verbally (Pressure injury- surgically excised melanoma - not a stage III wound or ulcer--moist to dry dressing change daily.)                       Personal Care Assistance Level of Assistance  Bathing, Feeding, Dressing Bathing Assistance: Limited assistance Feeding assistance: Independent Dressing Assistance: Limited assistance     Functional Limitations Info  Hearing, Sight, Speech Sight Info: Adequate Hearing Info: Adequate Speech Info: Adequate    SPECIAL CARE FACTORS FREQUENCY  PT (By licensed PT), OT (By licensed OT)     PT Frequency: 3x OT Frequency: 3x            Contractures Contractures Info: Not present    Additional Factors Info  Code Status, Allergies Code Status Info: DNR Allergies Info: Valium (Diazepam)           Current Medications (11/27/2017):  This is the current hospital active medication list Current Facility-Administered Medications  Medication Dose Route Frequency Provider Last Rate Last Dose  . acetaminophen (TYLENOL) tablet 650 mg  650 mg Oral Q8H PRN Lenore Cordia, MD   650 mg at 11/26/17 0920  . amLODipine (NORVASC) tablet 5 mg  5 mg Oral Daily Zada Finders R, MD   5 mg at 11/26/17 0920  . azithromycin (ZITHROMAX) tablet 500 mg  500 mg Oral Daily Ronna Polio, RPH   500 mg at 11/26/17 2100  . calcium-vitamin D (OSCAL WITH D) 500-200 MG-UNIT per tablet 1 tablet  1 tablet Oral Daily  Lenore Cordia, MD   1 tablet at 11/26/17 0920  . cefTRIAXone (ROCEPHIN) 1 g in sodium chloride 0.9 % 100 mL IVPB  1 g Intravenous Q24H Zada Finders R, MD 200 mL/hr at 11/26/17 1602 1 g at 11/26/17 1602  . collagenase (SANTYL) ointment   Topical Daily Charlynne Cousins, MD   1 application at 24/58/09 [phone]  . feeding supplement (ENSURE ENLIVE) (ENSURE ENLIVE) liquid 237 mL  237 mL Oral BID BM Zada Finders R, MD   237 mL at 11/24/17 0933  . lip balm (CARMEX) ointment   Topical PRN  Susa Raring M, RN      . warfarin (COUMADIN) tablet 3 mg  3 mg Oral q1800 Laren Everts, RPH   3 mg at 11/26/17 1756  . Warfarin - Pharmacist Dosing Inpatient   Does not apply q1800 Laren Everts, [facility]   1 each at 11/26/17 1756     Discharge Medications: Please see discharge summary for a list of discharge medications.  Relevant Imaging Results:  Relevant Lab Results:   Additional Information SSN; [id number]  Eileen Stanford, LCSW

## 2017-11-27 NOTE — Clinical Social Work Note (Signed)
Pt has chosen [facility]--SNF accepted. [facility] has started [facility] authorization. Pt's son, [name] will go to [facility] today at 2:30 to complete admission paperwork.   [place], [place]

## 2017-11-27 NOTE — Discharge Summary (Addendum)
Physician Discharge Summary  Julie Mosley XIP:[id number] DOB: 26-Jul-1926 DOA: 11/23/2017  PCP: Dineen Kid, MD  Admit date: 11/23/2017 Discharge date: 11/28/2017  Admitted From: home Disposition:  SNF  Recommendations for Outpatient Follow-up:  1. Follow up with PCP in 1-2 weeks 2. Continue Doxycycline for 2 more days  Addendum 11/28/2017 -Patient seen and examined this morning, remains stable, no clinical changes.  For full details please see summary below.  Medically stable for discharge  Home Health: none  Equipment/Devices: none  Discharge Condition: stable CODE STATUS: DNR Diet recommendation: regular  HPI: Per Dr. Erling Cruz is a 84 y.o. female with medical history significant for persistent atrial fibrillation on Coumadin, hypertension, history of TIA, moderate to severe aortic stenosis, dilated aortic root, left bundle branch block, and melanoma with surgical wound after left lateral ankle who presents the ED with shortness of breath.  Patient reports several weeks of shortness of breath with exertion, initially she did not think much of this.  However since her symptoms continued and she saw her PCP 1 week ago.  She had a chest x-ray performed and was diagnosed with pneumonia.  She was treated with and completed a one-week course of Levaquin and prednisone.  Despite this, she has had continued shortness of breath with minimal activity.  She has had associated cough productive of yellow sputum.  She reports poor appetite with low oral intake and low energy.  She has felt chills but denies fevers, chest pain, hemoptysis, abdominal pain, diaphoresis.  She is taking Coumadin and denies any obvious bleeding.  Hospital Course: Community acquired pneumonia -Patient admitted to the hospital for persistent multifocal pneumonia after failing outpatient antibiotics.  She was placed on ceftriaxone and azithromycin, cultures have remained negative.  Continue antibiotics for now, she  is clinically much improved, transition to doxycycline for 2 additional days on discharge Acute kidney injury -due to poor po intake in the setting of #1, now Cr has normalized. Essential hypertension -resume home medications ChronicA-fib ([place]) -CHADSVASc 6, rate controlled, Coumadin per pharmacy. Open wound of left ankle -She reports this is due to her recent surgical removal of her melanoma.  Continue wound care Hypokalemia -Replete orally now resolved. Mild elevation in cardiac biomarkers -The setting of an infectious etiology and moderate aortic stenosis they have remained flat she denies any chest pain. Likely demand ischemia.   Discharge Diagnoses:  Principal Problem:   Community acquired pneumonia Active Problems:   Hypertension   A-fib ([place])   Open wound of left ankle   Hypokalemia   AKI (acute kidney injury) ([place])   Pulmonary nodule less than 6 cm determined by computed tomography of lung   Pressure injury of skin     Discharge Instructions   Allergies as of 11/28/2017      Reactions   Valium [diazepam] Other (See Comments)   Stopped breathing      Medication List    TAKE these medications   acetaminophen 650 MG CR tablet Commonly known as:  TYLENOL Take 650 mg by mouth every 8 (eight) hours as needed for pain.   amLODipine 5 MG tablet Commonly known as:  NORVASC TAKE 1 TABLET BY MOUTH  DAILY   CALCIUM 600+D 600-200 MG-UNIT Tabs Generic drug:  Calcium Carbonate-Vitamin D Take 1 tablet by mouth daily.   CENTRUM SILVER PO Take 1 tablet by mouth daily.   doxycycline 100 MG capsule Commonly known as:  VIBRAMYCIN Take 1 capsule (100 mg total) by mouth 2 (  two) times daily.   Fish Oil 1000 MG Caps Take 1 capsule by mouth daily.   hydrochlorothiazide 25 MG tablet Commonly known as:  HYDRODIURIL TAKE ONE TABLET BY MOUTH ONCE DAILY What changed:  how to take this   lisinopril 20 MG tablet Commonly known as:  PRINIVIL,ZESTRIL TAKE 1 TABLET BY MOUTH TWO   TIMES DAILY What changed:  when to take this   OSTEO BI-FLEX JOINT SHIELD PO Take 1 tablet by mouth daily.   warfarin 3 MG tablet Commonly known as:  COUMADIN Take 3 mg by mouth daily.       Contact information for follow-up providers    [facility] Follow up.   Specialty:  Home Health Services Why:  HHPT Contact information: [address] [place] [address] [phone]            Contact information for after-discharge care    Destination    [facility] Preferred SNF .   Service:  Skilled Nursing Contact information: [place] [phone]                  Consultations:  None   Procedures/Studies:  Dg Chest 2 View  Result Date: 11/23/2017 CLINICAL DATA:  Pneumonia.  Cough and shortness of breath. EXAM: CHEST - 2 VIEW COMPARISON:  November 16, 2017 FINDINGS: Patchy infiltrates in the lungs, right greater than left, similar on the right in the interval and mildly more pronounced on the left. Cardiomegaly. The hila and mediastinum are normal. No pneumothorax. No pulmonary nodules or masses. IMPRESSION: Persistent bilateral pulmonary infiltrates, similar on the right in the interval at and mildly more prominent on the left. Recommend treatment with short-term follow-up to ensure resolution. Electronically Signed   By: Dorise Bullion III M.D   On: 11/23/2017 15:43   Ct Chest Wo Contrast  Result Date: 11/24/2017 CLINICAL DATA:  Follow-up lung nodule.  No chest complaints. EXAM: CT CHEST WITHOUT CONTRAST TECHNIQUE: Multidetector CT imaging of the chest was performed following the standard protocol without IV contrast. COMPARISON:  12/23/2016 FINDINGS: Cardiovascular: The heart is enlarged. Coronary artery calcifications are present. There is calcification dilatation of the aortic root, measuring 5.1 centimeters (previously 4.5 centimeters. Ascending aorta is 5.1 centimeters,  previously 5.0 centimeters. Aortic arch is 3.2 centimeters. Previously 3.1 centimeters. Distal arch is 3.2 centimeters, previously 3.0 centimeters. Descending thoracic aorta is 3.0 centimeters, stable. At the level of the diaphragmatic hiatus, the aorta is 2.8 centimeters, previously 2.6 centimeters. No evidence for displaced intimal calcifications. Mediastinum/Nodes: There is high attenuation material within the contrast, consistent reflux or poor peristalsis. No obstructing mass is identified. No mediastinal, hilar, or axillary adenopathy. Lungs/Pleura: There are numerous ground-glass opacities throughout the lungs bilaterally. These areas are discrete in the UPPER lobes and confluent in the LOWER lobes. These ground-glass opacities obscure the nodules previously described and is difficult to assess for new pulmonary nodules given the presence of significant lung opacities which are likely inflammatory/infectious. No pleural effusion. Upper Abdomen: Stable appearance of low-attenuation lesion within the RIGHT kidney. Musculoskeletal: Midthoracic spondylosis. IMPRESSION: 1. Ascending aortic aneurysm. Ascending aorta now measures 5.1 centimeters, previously 5.0 centimeters. Aortic root is 5.1 centimeters, previously 4.5 centimeters. Ascending thoracic aortic aneurysm. Recommend semi-annual imaging followup by CTA or MRA and referral to cardiothoracic surgery if not already obtained. This recommendation follows 2010 ACCF/AHA/AATS/ACR/ASA/SCA/SCAI/SIR/STS/SVM Guidelines for the Diagnosis and Management of Patients With Thoracic Aortic Disease. Circulation. 2010; 121: V371-G626 2. New bilateral  areas of ground-glass opacities and associated air bronchograms throughout the lungs bilaterally, confluent at the bases. Findings are consistent with infectious process. Follow-up CT of the chest is recommended in 3 months. 3. Debris within the esophagus consistent with poor peristalsis/obstruction, or reflux. 4. Cyst within  the UPPER pole of the RIGHT kidney, stable in appearance. Electronically Signed   By: Nolon Nations M.D.   On: 11/24/2017 09:33     Subjective: - no chest pain, shortness of breath, no abdominal pain, nausea or vomiting.   Discharge Exam: Vitals:   11/28/17 0000 11/28/17 0838  BP: 121/80 125/74  Pulse: 79 91  Resp: 16 20  Temp: 98.7 F (37.1 C) 98.6 F (37 C)  SpO2: 91% 94%    General: Pt is alert, awake, not in acute distress Cardiovascular: RRR, S1/S2 +, no rubs, no gallops Respiratory: rhonchi mostly on right lung field, no wheezing  Abdominal: Soft, NT, ND, bowel sounds + Extremities: no edema, no cyanosis    The results of significant diagnostics from this hospitalization (including imaging, microbiology, ancillary and laboratory) are listed below for reference.     Microbiology: No results found for this or any previous visit (from the past 240 hour(s)).   Labs: BNP (last 3 results) No results for input(s): BNP in the last 8760 hours. Basic Metabolic Panel: Recent Labs  Lab 11/23/17 1446 11/24/17 0221 11/25/17 0249 11/26/17 0250 11/27/17 0250  NA 138 139 140 139 137  K 3.4* 3.9 3.8 3.7 3.7  CL 100 105 109 110 104  CO2 29 29 26 26 25   GLUCOSE 101* 98 98 102* 98  BUN 38* 35* 29* 25* 25*  CREATININE 1.41* 1.27* 1.09* 1.03* 0.97  CALCIUM 9.9 8.7* 8.5* 8.2* 8.1*   Liver Function Tests: No results for input(s): AST, ALT, ALKPHOS, BILITOT, PROT, ALBUMIN in the last 168 hours. No results for input(s): LIPASE, AMYLASE in the last 168 hours. No results for input(s): AMMONIA in the last 168 hours. CBC: Recent Labs  Lab 11/23/17 1446 11/24/17 0221  WBC 9.5 7.4  NEUTROABS 7.1  --   HGB 13.9 12.0  HCT 43.7 37.3  MCV 97.3 96.4  PLT 113* 93*   Cardiac Enzymes: Recent Labs  Lab 11/23/17 1645 11/23/17 2220  TROPONINI 0.04* 0.05*   BNP: Invalid input(s): POCBNP CBG: No results for input(s): GLUCAP in the last 168 hours. D-Dimer No results for  input(s): DDIMER in the last 72 hours. Hgb A1c No results for input(s): HGBA1C in the last 72 hours. Lipid Profile No results for input(s): CHOL, HDL, LDLCALC, TRIG, CHOLHDL, LDLDIRECT in the last 72 hours. Thyroid function studies No results for input(s): TSH, T4TOTAL, T3FREE, THYROIDAB in the last 72 hours.  Invalid input(s): FREET3 Anemia work up No results for input(s): VITAMINB12, FOLATE, FERRITIN, TIBC, IRON, RETICCTPCT in the last 72 hours. Urinalysis    Component Value Date/Time   COLORURINE YELLOW 11/23/2017 1727   APPEARANCEUR HAZY (A) 11/23/2017 1727   LABSPEC 1.017 11/23/2017 1727   PHURINE 6.0 11/23/2017 1727   GLUCOSEU NEGATIVE 11/23/2017 1727   HGBUR NEGATIVE 11/23/2017 1727   BILIRUBINUR NEGATIVE 11/23/2017 1727   KETONESUR NEGATIVE 11/23/2017 1727   PROTEINUR 100 (A) 11/23/2017 1727   UROBILINOGEN 1.0 04/04/2012 0227   NITRITE NEGATIVE 11/23/2017 1727   LEUKOCYTESUR TRACE (A) 11/23/2017 1727   Sepsis Labs Invalid input(s): PROCALCITONIN,  WBC,  LACTICIDVEN   Time coordinating discharge: 35 minutes  SIGNED:  Marzetta Board, MD  [facility] 11/28/2017, 11:42 AM Pager  [phone]  If 7PM-7AM, please contact night-coverage [email or web address] Password [email or web address]

## 2017-11-27 NOTE — Progress Notes (Signed)
[place] for Coumadin Indication: atrial fibrillation  Allergies  Allergen Reactions  . Valium [Diazepam] Other (See Comments)    Stopped breathing    Vital Signs: Temp: 98.3 F (36.8 C) (11/04 0727) Temp Source: Oral (11/04 0727) BP: 94/68 (11/04 0727) Pulse Rate: 87 (11/04 0727)  Labs: Recent Labs    11/25/17 0249 11/26/17 0250 11/27/17 0250  LABPROT 24.1* 25.1* 25.6*  INR 2.20 2.32 2.37  CREATININE 1.09* 1.03* 0.97    Assessment: 84yo female c/o SOB, admitted w/ CAP, to continue Coumadin for Afib.  INR is 2.37 and at goal. No bleeding noted, Hgb stable, platelets are low at 93.  Goal of Therapy:  INR 2-3   Plan:  Coumadin 3 mg PO daily (as per home dose) Daily INR Monitor for s/sx of bleeding  Marshal Eskew A. Levada Dy, PharmD, [place] Pager: [phone] Please utilize Amion for appropriate phone number to reach the unit pharmacist ([place])   11/27/2017       9:07 AM

## 2017-11-27 NOTE — Care Management Important Message (Signed)
Important Message  Patient Details  Name: Julie Mosley MRN: [id number] Date of Birth: 05-Nov-1926   Medicare Important Message Given:  Yes    Latissa Frick [address] 11/27/2017, 3:33 PM

## 2017-11-27 NOTE — Clinical Social Work Note (Signed)
Clinical Social Work Assessment  Patient Details  Name: Julie Mosley MRN: [id number] Date of Birth: 1926-03-02  Date of referral:  11/27/17               Reason for consult:  Facility Placement                Permission sought to share information with:  Family Supports Permission granted to share information::  Yes, Verbal Permission Granted, Yes, Release of Information Signed  Name::     Therapist, occupational::  [facility]  Relationship::  Son  Sport and exercise psychologist Information:     Housing/Transportation Living arrangements for the past 2 months:  Single Family Home Source of Information:  Patient Patient Interpreter Needed:  None Criminal Activity/Legal Involvement Pertinent to Current Situation/Hospitalization:  No - Comment as needed Significant Relationships:  Adult Children Lives with:  Self Do you feel safe going back to the place where you live?  No Need for family participation in patient care:  No (Coment)  Care giving concerns:  Pt is alert and oriented. Pt's son, [name] present at bedside.   Social Worker assessment / plan:  CSW spoke with pt at bedside. Pt lives alone. Pt is agreeable to SNF and would prefer to go to [facility]. CSW will reach out to facility.   Employment status:  Retired Nurse, adult PT Recommendations:  [place] / Referral to community resources:  [place]  Patient/Family's Response to care:  Pt verbalized understanding of CSW role and expressed appreciation for support. Pt denies any concern regarding pt care at this time.   Patient/Family's Understanding of and Emotional Response to Diagnosis, Current Treatment, and Prognosis:   Pt understanding and realistic regarding physical limitations. Pt understands the need for SNF placement at d/c. Pt agreeable to SNF placement at d/c, at this time. Pt's responses emotionally appropriate during conversation with CSW. Pt denies any concern regarding  treatment plan at this time. CSW will continue to provide support and facilitate d/c needs.  Emotional Assessment Appearance:  Appears stated age Attitude/Demeanor/Rapport:  (Patient was appropriate) Affect (typically observed):  Accepting, Appropriate, Calm Orientation:  Oriented to Situation, Oriented to  Time, Oriented to Place, Oriented to Self Alcohol / Substance use:  Not Applicable Psych involvement (Current and /or in the community):  No (Comment)  Discharge Needs  Concerns to be addressed:  Basic Needs Readmission within the last 30 days:  No Current discharge risk:  Dependent with Mobility Barriers to Discharge:  Continued Medical Work up, [employer], LCSW 11/27/2017, 10:38 AM

## 2017-11-28 LAB — PROTIME-INR
INR: 2.41
Prothrombin Time: 25.9 seconds — ABNORMAL HIGH (ref 11.4–15.2)

## 2017-11-28 NOTE — Progress Notes (Signed)
[place] for Coumadin Indication: atrial fibrillation  Allergies  Allergen Reactions  . Valium [Diazepam] Other (See Comments)    Stopped breathing    Vital Signs: Temp: 98.6 F (37 C) (11/05 0838) Temp Source: Oral (11/05 0838) BP: 125/74 (11/05 0838) Pulse Rate: 91 (11/05 0838)  Labs: Recent Labs    11/26/17 0250 11/27/17 0250 11/28/17 0225  LABPROT 25.1* 25.6* 25.9*  INR 2.32 2.37 2.41  CREATININE 1.03* 0.97  --     Assessment: 84yo female c/o SOB, admitted w/ CAP, to continue Coumadin for Afib.  INR is 2.41 and at goal. No bleeding noted, Hgb stable, platelets are low at 93.  Goal of Therapy:  INR 2-3   Plan:  Coumadin 3 mg PO daily (as per home dose) Daily INR Monitor for s/sx of bleeding  Lashana Spang A. Levada Dy, PharmD, [place] Pager: [phone] Please utilize Amion for appropriate phone number to reach the unit pharmacist ([place])   11/28/2017       8:40 AM

## 2017-11-28 NOTE — Clinical Social Work Note (Signed)
Clinical Social Worker facilitated patient discharge including contacting patient family and facility to confirm patient discharge plans.  Clinical information faxed to facility and family agreeable with plan.  Pt's son will transport pt to [facility] .  RN to call [phone] for report prior to discharge.  Clinical Social Worker will sign off for now as social work intervention is no longer needed. Please consult [place] again if new need arises.  [place], [place]

## 2017-11-28 NOTE — Progress Notes (Signed)
Physical Therapy Treatment Patient Details Name: Julie Mosley MRN: [id number] DOB: [birth year] Today's Date: 11/28/2017    History of Present Illness Patient is a 84 y/o female who presents with SOB, fever and cough, recently diagnosed with PNA but failed outpatient treatment. CXR-showing billateral pulmonary infiltrates. PMH includes A-fib on Coumadin, TIA, aortic stenosis, LBBB and melanoma surgically removed.    PT Comments    Pt was able to walk with PT to go to hallway, as well as get to [facility] with min assist.  Her plan is to progress gait and balance to safer level to allow pt home, but for now continue to recommend SNF to advance safety as tolerated for best transition home.  Pt is with her son and does have some confusion but son is well aware and will be providing help as needed.   .    Follow Up Recommendations  SNF     Equipment Recommendations  None recommended by PT    Recommendations for Other Services       Precautions / Restrictions Precautions Precautions: Fall Restrictions Weight Bearing Restrictions: No    Mobility  Bed Mobility Overal bed mobility: Modified Independent Bed Mobility: Rolling;Sidelying to Sit           General bed mobility comments: rails to sit up   Transfers Overall transfer level: Needs assistance Equipment used: Rolling walker (2 wheeled) Transfers: Sit to/from Stand Sit to Stand: Min guard         General transfer comment: bed to [facility] for management of incontinence  Ambulation/Gait Ambulation/Gait assistance: Min guard Gait Distance (Feet): 50 Feet Assistive device: Rolling walker (2 wheeled) Gait Pattern/deviations: Step-through pattern;Decreased stride length;Drifts right/left;Narrow base of support Gait velocity: decreased Gait velocity interpretation: <1.31 ft/sec, indicative of household ambulator General Gait Details: safer with walker, cues for proper use and proximity   Stairs             Wheelchair  Mobility    Modified Rankin (Stroke Patients Only)       Balance     Sitting balance-Leahy Scale: Good       Standing balance-Leahy Scale: Fair Standing balance comment: Requires UE support for standing balance. Able to help pull up underwear and perform pericare.                            Cognition Arousal/Alertness: Awake/alert Behavior During Therapy: WFL for tasks assessed/performed Overall Cognitive Status: Within Functional Limits for tasks assessed                                        Exercises      General Comments General comments (skin integrity, edema, etc.): pt changed her pad on mesh pants with PT help      Pertinent Vitals/Pain Pain Assessment: Faces Faces Pain Scale: No hurt    Home Living                      Prior Function            PT Goals (current goals can now be found in the care plan section) Acute Rehab PT Goals Patient Stated Goal: to feel better and go home PT Goal Formulation: With patient/family Time For Goal Achievement: 11/28/17 Progress towards PT goals: Progressing toward goals    Frequency    Min  3X/week      PT Plan Current plan remains appropriate    Co-evaluation              AM-PAC PT "6 Clicks" Daily Activity  Outcome Measure  Difficulty turning over in bed (including adjusting bedclothes, sheets and blankets)?: A Little Difficulty moving from lying on back to sitting on the side of the bed? : A Little Difficulty sitting down on and standing up from a chair with arms (e.g., wheelchair, bedside commode, etc,.)?: Unable Help needed moving to and from a bed to chair (including a wheelchair)?: A Little Help needed walking in hospital room?: A Little Help needed climbing 3-5 steps with a railing? : A Lot 6 Click Score: 15    End of Session Equipment Utilized During Treatment: Gait belt Activity Tolerance: Patient tolerated treatment well Patient left: with call  bell/phone within reach;with family/visitor present;in bed Nurse Communication: Mobility status PT Visit Diagnosis: Muscle weakness (generalized) (M62.81);Unsteadiness on feet (R26.81)     Time: 8979-1504 PT Time Calculation (min) (ACUTE ONLY): 28 min  Charges:  $Gait Training: 8-22 mins $Therapeutic Activity: 8-22 mins                JACQUILINE ZURCHER 11/28/2017, 1:26 PM   Mee Hives, PT MS Acute Rehab Dept. Number: [place] and [place]

## 2017-11-28 NOTE — Clinical Social Work Placement (Signed)
   CLINICAL SOCIAL WORK PLACEMENT  NOTE  Date:  11/28/2017  Patient Details  Name: Julie Mosley MRN: [id number] Date of Birth: 1926/10/28  Clinical Social Work is seeking post-discharge placement for this patient at the [place] level of care (*CSW will initial, date and re-position this form in  chart as items are completed):      Patient/family provided with [place] Work Department's list of facilities offering this level of care within the geographic area requested by the patient (or if unable, by the patient's family).  Yes   Patient/family informed of their freedom to choose among providers that offer the needed level of care, that participate in Medicare, Medicaid or managed care program needed by the patient, have an available bed and are willing to accept the patient.      Patient/family informed of [facility]'s ownership interest in [facility] and [facility], as well as of the fact that they are under no obligation to receive care at these facilities.  PASRR submitted to EDS on       PASRR number received on 11/27/17     Existing PASRR number confirmed on       FL2 transmitted to all facilities in geographic area requested by pt/family on 11/27/17     FL2 transmitted to all facilities within larger geographic area on       Patient informed that his/her managed care company has contracts with or will negotiate with certain facilities, including the following:        Yes   Patient/family informed of bed offers received.  Patient chooses bed at [facility]     Physician recommends and patient chooses bed at      Patient to be transferred to [facility] on 11/28/17.  Patient to be transferred to facility by Pt's son will transport     Patient family notified on 11/28/17 of transfer.  Name of family member notified:  Pt and ken, pt's son     PHYSICIAN       Additional Comment:     _______________________________________________ Eileen Stanford, LCSW 11/28/2017, 11:56 AM

## 2017-12-15 ENCOUNTER — Other Ambulatory Visit: Payer: Self-pay

## 2017-12-15 ENCOUNTER — Encounter (HOSPITAL_COMMUNITY): Payer: Self-pay | Admitting: Emergency Medicine

## 2017-12-15 ENCOUNTER — Inpatient Hospital Stay (HOSPITAL_COMMUNITY)
Admission: EM | Admit: 2017-12-15 | Discharge: 2017-12-20 | DRG: 189 | Disposition: A | Discharge status: Skilled Nursing Facility | Payer: Medicare Other | Attending: Internal Medicine | Admitting: Internal Medicine

## 2017-12-15 ENCOUNTER — Emergency Department (HOSPITAL_COMMUNITY): Payer: Medicare Other

## 2017-12-15 DIAGNOSIS — J8410 Pulmonary fibrosis, unspecified: Secondary | ICD-10-CM | POA: Diagnosis present

## 2017-12-15 DIAGNOSIS — R0902 Hypoxemia: Secondary | ICD-10-CM

## 2017-12-15 DIAGNOSIS — K219 Gastro-esophageal reflux disease without esophagitis: Secondary | ICD-10-CM | POA: Diagnosis present

## 2017-12-15 DIAGNOSIS — E876 Hypokalemia: Secondary | ICD-10-CM | POA: Diagnosis present

## 2017-12-15 DIAGNOSIS — J9601 Acute respiratory failure with hypoxia: Secondary | ICD-10-CM | POA: Diagnosis not present

## 2017-12-15 DIAGNOSIS — I4819 Other persistent atrial fibrillation: Secondary | ICD-10-CM | POA: Diagnosis present

## 2017-12-15 DIAGNOSIS — J189 Pneumonia, unspecified organism: Secondary | ICD-10-CM

## 2017-12-15 DIAGNOSIS — I4891 Unspecified atrial fibrillation: Secondary | ICD-10-CM | POA: Diagnosis present

## 2017-12-15 DIAGNOSIS — Z87891 Personal history of nicotine dependence: Secondary | ICD-10-CM

## 2017-12-15 DIAGNOSIS — R911 Solitary pulmonary nodule: Secondary | ICD-10-CM | POA: Diagnosis present

## 2017-12-15 DIAGNOSIS — Z7189 Other specified counseling: Secondary | ICD-10-CM

## 2017-12-15 DIAGNOSIS — S91002A Unspecified open wound, left ankle, initial encounter: Secondary | ICD-10-CM | POA: Diagnosis present

## 2017-12-15 DIAGNOSIS — J849 Interstitial pulmonary disease, unspecified: Secondary | ICD-10-CM

## 2017-12-15 DIAGNOSIS — I2510 Atherosclerotic heart disease of native coronary artery without angina pectoris: Secondary | ICD-10-CM | POA: Diagnosis present

## 2017-12-15 DIAGNOSIS — I350 Nonrheumatic aortic (valve) stenosis: Secondary | ICD-10-CM | POA: Diagnosis present

## 2017-12-15 DIAGNOSIS — Z79899 Other long term (current) drug therapy: Secondary | ICD-10-CM

## 2017-12-15 DIAGNOSIS — E860 Dehydration: Secondary | ICD-10-CM | POA: Diagnosis present

## 2017-12-15 DIAGNOSIS — Z66 Do not resuscitate: Secondary | ICD-10-CM | POA: Diagnosis present

## 2017-12-15 DIAGNOSIS — Z7901 Long term (current) use of anticoagulants: Secondary | ICD-10-CM

## 2017-12-15 DIAGNOSIS — N179 Acute kidney failure, unspecified: Secondary | ICD-10-CM | POA: Diagnosis present

## 2017-12-15 DIAGNOSIS — Z85820 Personal history of malignant melanoma of skin: Secondary | ICD-10-CM

## 2017-12-15 DIAGNOSIS — Z515 Encounter for palliative care: Secondary | ICD-10-CM

## 2017-12-15 DIAGNOSIS — IMO0001 Reserved for inherently not codable concepts without codable children: Secondary | ICD-10-CM | POA: Diagnosis present

## 2017-12-15 DIAGNOSIS — T17908A Unspecified foreign body in respiratory tract, part unspecified causing other injury, initial encounter: Secondary | ICD-10-CM

## 2017-12-15 DIAGNOSIS — Z8673 Personal history of transient ischemic attack (TIA), and cerebral infarction without residual deficits: Secondary | ICD-10-CM

## 2017-12-15 DIAGNOSIS — R918 Other nonspecific abnormal finding of lung field: Secondary | ICD-10-CM

## 2017-12-15 DIAGNOSIS — I10 Essential (primary) hypertension: Secondary | ICD-10-CM | POA: Diagnosis present

## 2017-12-15 HISTORY — DX: Acute kidney failure, unspecified: N17.9

## 2017-12-15 HISTORY — DX: Pneumonia, unspecified organism: J18.9

## 2017-12-15 HISTORY — DX: Solitary pulmonary nodule: R91.1

## 2017-12-15 HISTORY — DX: Acute respiratory failure with hypoxia: J96.01

## 2017-12-15 LAB — CBC WITH DIFFERENTIAL/PLATELET
Abs Immature Granulocytes: 0.06 10*3/uL (ref 0.00–0.07)
BASOS ABS: 0 10*3/uL (ref 0.0–0.1)
Basophils Relative: 0 %
EOS ABS: 0 10*3/uL (ref 0.0–0.5)
EOS PCT: 0 %
HCT: 43.5 % (ref 36.0–46.0)
HEMOGLOBIN: 13.5 g/dL (ref 12.0–15.0)
Immature Granulocytes: 1 %
LYMPHS PCT: 14 %
Lymphs Abs: 0.7 10*3/uL (ref 0.7–4.0)
MCH: 31.4 pg (ref 26.0–34.0)
MCHC: 31 g/dL (ref 30.0–36.0)
MCV: 101.2 fL — ABNORMAL HIGH (ref 80.0–100.0)
Monocytes Absolute: 0.4 10*3/uL (ref 0.1–1.0)
Monocytes Relative: 9 %
NRBC: 0 % (ref 0.0–0.2)
Neutro Abs: 3.9 10*3/uL (ref 1.7–7.7)
Neutrophils Relative %: 76 %
PLATELETS: 98 10*3/uL — AB (ref 150–400)
RBC: 4.3 MIL/uL (ref 3.87–5.11)
RDW: 16 % — AB (ref 11.5–15.5)
WBC: 5.2 10*3/uL (ref 4.0–10.5)

## 2017-12-15 LAB — COMPREHENSIVE METABOLIC PANEL
ALT: 21 U/L (ref 0–44)
AST: 48 U/L — ABNORMAL HIGH (ref 15–41)
Albumin: 3.3 g/dL — ABNORMAL LOW (ref 3.5–5.0)
Alkaline Phosphatase: 84 U/L (ref 38–126)
Anion gap: 12 (ref 5–15)
BUN: 31 mg/dL — ABNORMAL HIGH (ref 8–23)
CHLORIDE: 102 mmol/L (ref 98–111)
CO2: 24 mmol/L (ref 22–32)
CREATININE: 1.66 mg/dL — AB (ref 0.44–1.00)
Calcium: 8.7 mg/dL — ABNORMAL LOW (ref 8.9–10.3)
GFR, EST AFRICAN AMERICAN: 30 mL/min — AB (ref >60)
GFR, EST NON AFRICAN AMERICAN: 26 mL/min — AB (ref >60)
Glucose, Bld: 98 mg/dL (ref 70–99)
Potassium: 3.8 mmol/L (ref 3.5–5.1)
Sodium: 138 mmol/L (ref 135–145)
Total Bilirubin: 0.9 mg/dL (ref 0.3–1.2)
Total Protein: 6.1 g/dL — ABNORMAL LOW (ref 6.5–8.1)

## 2017-12-15 LAB — I-STAT CG4 LACTIC ACID, ED
LACTIC ACID, VENOUS: 1.25 mmol/L (ref 0.5–1.9)
Lactic Acid, Venous: 3.99 mmol/L (ref 0.5–1.9)

## 2017-12-15 LAB — PROTIME-INR
INR: 1.58
PROTHROMBIN TIME: 18.7 s — AB (ref 11.4–15.2)

## 2017-12-15 MED ORDER — WARFARIN - PHARMACIST DOSING INPATIENT
Freq: Every day | Status: DC
  Administered 2017-12-16: 22:00:00

## 2017-12-15 MED ORDER — SODIUM CHLORIDE 0.9 % IV SOLN
2.0000 g | INTRAVENOUS | Status: DC
  Administered 2017-12-16: 2 g via INTRAVENOUS
  Filled 2017-12-15 (×2): qty 2

## 2017-12-15 MED ORDER — VANCOMYCIN HCL IN DEXTROSE 1-5 GM/200ML-% IV SOLN: 1000.0000 mg | INTRAVENOUS | Status: DC

## 2017-12-15 MED ORDER — WARFARIN SODIUM 4 MG PO TABS
4.0000 mg | ORAL_TABLET | Freq: Once | ORAL | Status: AC
  Administered 2017-12-15: 4 mg via ORAL
  Filled 2017-12-15: qty 1

## 2017-12-15 MED ORDER — SODIUM CHLORIDE 0.9 % IV SOLN
2.0000 g | Freq: Once | INTRAVENOUS | Status: AC
  Administered 2017-12-15: 2 g via INTRAVENOUS
  Filled 2017-12-15: qty 2

## 2017-12-15 MED ORDER — SODIUM CHLORIDE 0.9 % IV BOLUS
1500.0000 mL | Freq: Once | INTRAVENOUS | Status: AC
  Administered 2017-12-15: 1500 mL via INTRAVENOUS

## 2017-12-15 MED ORDER — VANCOMYCIN HCL IN DEXTROSE 1-5 GM/200ML-% IV SOLN
1000.0000 mg | Freq: Once | INTRAVENOUS | Status: AC
  Administered 2017-12-15: 1000 mg via INTRAVENOUS
  Filled 2017-12-15: qty 200

## 2017-12-15 MED ORDER — GUAIFENESIN ER 600 MG PO TB12
600.0000 mg | ORAL_TABLET | Freq: Two times a day (BID) | ORAL | Status: DC
  Administered 2017-12-15 – 2017-12-20 (×10): 600 mg via ORAL
  Filled 2017-12-15 (×10): qty 1

## 2017-12-15 MED ORDER — SODIUM CHLORIDE 0.9 % IV SOLN
INTRAVENOUS | Status: AC
  Administered 2017-12-15: 20:00:00 via INTRAVENOUS

## 2017-12-15 MED ORDER — GUAIFENESIN ER 600 MG PO TB12: 600.0000 mg | ORAL_TABLET | Freq: Two times a day (BID) | ORAL | Status: DC

## 2017-12-15 MED ORDER — ACETAMINOPHEN 325 MG PO TABS: 650.0000 mg | ORAL_TABLET | Freq: Three times a day (TID) | ORAL | Status: DC | PRN

## 2017-12-15 NOTE — H&P (Signed)
History and Physical    Julie Mosley VPX:[id number] DOB: 1926-03-05 DOA: 12/15/2017  PCP: Dineen Kid, MD  Patient coming from: Home  I have personally briefly reviewed patient's old medical records in [place]  Chief Complaint: Shortness of breath  HPI: Julie Mosley is a 84 y.o. female with medical history significant for persistent atrial fibrillation on Coumadin, hypertension, history of TIA, moderate-severe aortic stenosis, dilated aortic root, and LBBB who presents with shortness of breath.  Patient was recently admitted from 11/23/2017-11/28/2017 for pneumonia and treated with ceftriaxone/azithromycin and discharged on doxycycline.  She was discharged to a rehab facility and did well until returning home about 3 or 4 days ago.  She then developed progressive shortness of breath with dry cough and generalized fatigue.  Home health therapy checked her oxygen saturation today which was reportedly 74%.  She was therefore brought to the emergency department.  She denies any subjective fevers, diaphoresis, chest pain, abdominal pain, dysuria.  ED Course:  Initial vitals showed BP 109/85, pulse 89, RR 23, temp 44F, SPO2 99% on 2L [place].  Lab work was notable for creatinine 1.66 compared to 0.97 on 11/27/2017, WBC 5.2, hemoglobin 13.5, platelets 98k (93k 11/24/17).  Initial lactic acid was 3.99 improved to 1.25 after IV NS 1500 mLs.  Chest x-ray showed minimal improvement in aeration compared to prior suggesting a resolving pneumonia with postinflammatory process.  She was started on broad-spectrum antibiotics with vancomycin/cefepime and the hospitalist service was consulted to admit.  Review of Systems: As per HPI otherwise 10 point review of systems negative.    Past Medical History:  Diagnosis Date  . Cancer ([place])   . Chronic anticoagulation 12/13/2012   Followed by Dr. Chapman Fitch  . Dilated aortic root ([place]) 12/13/2012   4.5 cm 2013.   [place] Hypertension   . Mild aortic stenosis 12/13/2012    . TIA (transient ischemic attack)     Past Surgical History:  Procedure Laterality Date  . BREAST LUMPECTOMY     bilateral  . CESAREAN SECTION    . HIP SURGERY    . SKIN BIOPSY     From left foot.     reports that she quit smoking about 42 years ago. She has never used smokeless tobacco. She reports that she does not drink alcohol or use drugs.  Allergies  Allergen Reactions  . Valium [Diazepam] Other (See Comments)    Stopped breathing    Family History  Problem Relation Age of Onset  . Heart attack Mother   . Heart attack Sister   . Heart attack Brother      Prior to Admission medications   Medication Sig Start Date End Date Taking? Authorizing Provider  acetaminophen (TYLENOL) 650 MG CR tablet Take 650 mg by mouth every 8 (eight) hours as needed for pain.    [provider]  amLODipine (NORVASC) 5 MG tablet TAKE 1 TABLET BY MOUTH  DAILY Patient taking differently: Take 5 mg by mouth daily.  01/23/17   Jerline Pain, MD  Calcium Carbonate-Vitamin D (CALCIUM 600+D) 600-200 MG-UNIT TABS Take 1 tablet by mouth daily.    [provider]  doxycycline (VIBRAMYCIN) 100 MG capsule Take 1 capsule (100 mg total) by mouth 2 (two) times daily. 11/27/17   Caren Griffins, MD  hydrochlorothiazide (HYDRODIURIL) 25 MG tablet TAKE ONE TABLET BY MOUTH ONCE DAILY Patient taking differently: 25 mg daily.  03/01/16   Jerline Pain, MD  lisinopril (PRINIVIL,ZESTRIL) 20 MG tablet  TAKE 1 TABLET BY MOUTH TWO  TIMES DAILY Patient taking differently: Take 20 mg by mouth daily.  10/30/17   Jerline Pain, MD  Misc Natural Products (OSTEO BI-FLEX JOINT SHIELD PO) Take 1 tablet by mouth daily.     [provider]  Multiple Vitamins-Minerals (CENTRUM SILVER PO) Take 1 tablet by mouth daily.     [provider]  Omega-3 Fatty Acids (FISH OIL) 1000 MG CAPS Take 1 capsule by mouth daily.    [provider]  warfarin (COUMADIN) 3 MG tablet Take 3 mg by mouth  daily.     [provider]    Physical Exam: Vitals:   12/15/17 1615 12/15/17 1645 12/15/17 1700 12/15/17 1900  BP: 125/75 133/84 128/81 112/79  Pulse: 83 79 77 76  Resp: (!) 21 (!) 23 (!) 22 16  Temp:      TempSrc:      SpO2: 94% 97% 96%   Weight: 60 kg     Height: 5\' 7"  (1.702 m)       Constitutional: elderly woman, calm, comfortable Eyes: PERRL, lids and conjunctivae normal ENMT: Mucous membranes are dry. Posterior pharynx clear of any exudate or lesions. Neck: normal, supple, no masses. Respiratory: Inspiratory and expiratory rhonchi bilateral lung fields, normal respiratory effort. No accessory muscle use.  Cardiovascular: Irregularly irregular,2/6 stock murmur, no  rubs / gallops. No extremity edema. Abdomen: no tenderness, no masses palpated. No hepatosplenomegaly. Bowel sounds positive.  Musculoskeletal: no clubbing / cyanosis. No joint deformity upper and lower extremities. Good ROM, no contractures. Normal muscle tone.  Skin: Circumferential ulcer left lateral malleolus with scant serous and with drainage. Neurologic: CN 2-12 grossly intact. Sensation intact. Strength 5/5 in all 4.  Psychiatric: Normal judgment and insight. Alert and oriented x 3. Normal mood.     Labs on Admission: I have personally reviewed following labs and imaging studies  CBC: Recent Labs  Lab 12/15/17 1505  WBC 5.2  NEUTROABS 3.9  HGB 13.5  HCT 43.5  MCV 101.2*  PLT 98*   Basic Metabolic Panel: Recent Labs  Lab 12/15/17 1505  NA 138  K 3.8  CL 102  CO2 24  GLUCOSE 98  BUN 31*  CREATININE 1.66*  CALCIUM 8.7*   GFR: Estimated Creatinine Clearance: 21.8 mL/min (A) (by C-G formula based on SCr of 1.66 mg/dL (H)). Liver Function Tests: Recent Labs  Lab 12/15/17 1505  AST 48*  ALT 21  ALKPHOS 84  BILITOT 0.9  PROT 6.1*  ALBUMIN 3.3*   No results for input(s): LIPASE, AMYLASE in the last 168 hours. No results for input(s): AMMONIA in the last 168  hours. Coagulation Profile: Recent Labs  Lab 12/15/17 1707  INR 1.58   Cardiac Enzymes: No results for input(s): CKTOTAL, CKMB, CKMBINDEX, TROPONINI in the last 168 hours. BNP (last 3 results) No results for input(s): PROBNP in the last 8760 hours. HbA1C: No results for input(s): HGBA1C in the last 72 hours. CBG: No results for input(s): GLUCAP in the last 168 hours. Lipid Profile: No results for input(s): CHOL, HDL, LDLCALC, TRIG, CHOLHDL, LDLDIRECT in the last 72 hours. Thyroid Function Tests: No results for input(s): TSH, T4TOTAL, FREET4, T3FREE, THYROIDAB in the last 72 hours. Anemia Panel: No results for input(s): VITAMINB12, FOLATE, FERRITIN, TIBC, IRON, RETICCTPCT in the last 72 hours. Urine analysis:    Component Value Date/Time   COLORURINE YELLOW 11/23/2017 1727   APPEARANCEUR HAZY (A) 11/23/2017 1727   LABSPEC 1.017 11/23/2017 1727  PHURINE 6.0 11/23/2017 1727   GLUCOSEU NEGATIVE 11/23/2017 1727   HGBUR NEGATIVE 11/23/2017 1727   [place] 11/23/2017 1727   KETONESUR NEGATIVE 11/23/2017 1727   PROTEINUR 100 (A) 11/23/2017 1727   UROBILINOGEN 1.0 04/04/2012 0227   NITRITE NEGATIVE 11/23/2017 1727   LEUKOCYTESUR TRACE (A) 11/23/2017 1727    Radiological Exams on Admission: Dg Chest 2 View  Result Date: 12/15/2017 CLINICAL DATA:  84 year old female with ongoing shortness of breath, weakness and dry cough for 1 week. Recently diagnosed with pneumonia. EXAM: CHEST - 2 VIEW COMPARISON:  Chest x-ray 11/23/2017.  Chest CT 11/24/2017. FINDINGS: When compared to the prior chest x-ray from 11/23/2017, there continues to be widespread but patchy peripheral predominant areas of airspace consolidation and ground-glass opacities with some associated interstitial prominence. These appear slightly less confluent than the prior examination, particularly in the region of the right lower lobe, however, the overall distribution is very similar to the prior examination.  Trace right pleural effusion. No definite left pleural effusion. No pneumothorax. No evidence of pulmonary edema. Heart size is normal. Upper mediastinal contours are within normal limits. Aortic atherosclerosis. IMPRESSION: 1. Minimal improvement in aeration compared to prior study from 11/23/2017. Findings are favored to reflect a resolving pneumonia, potentially with post inflammatory process such as evolving cryptogenic organizing pneumonia (COP). Based on the appearance of the prior chest CT, the possibility of multifocal neoplasm such as adenocarcinoma could also be considered, but is not strongly favored. As previously suggested, repeat chest CT (preferably high-resolution chest CT) is recommended in early February 2020 to reassess these findings. 2. Aortic atherosclerosis. Electronically Signed   By: Vinnie Langton M.D.   On: 12/15/2017 17:06    Assessment/Plan Principal Problem:   Hypoxia Active Problems:   Hypertension   A-fib (HCC)   Pneumonia   Open wound of left ankle   AKI (acute kidney injury) (HCC)   Julie Mosley is a 84 y.o. female with medical history significant for persistent atrial fibrillation on Coumadin, hypertension, history of TIA, moderate-severe aortic stenosis, dilated aortic root, and LBBB recently admitted from 10/31-11/5 for community-acquired pneumonia returns with progressive shortness of breath and hypoxia.  Pneumonia with potential postinflammatory changes and hypoxia: She has been started on broad-spectrum antibiotics for HCAP, will continue for now.  She is otherwise afebrile and without leukocytosis. Would consider inflammatory lung disease in the differential if not continuing to improve. -Continue vancomycin/cefepime, narrow as able -Supplemental oxygen as needed -PT/OT eval  Persistent atrial fibrillation on Coumadin: Currently rate controlled. -Continue Coumadin per pharmacy  AKI: Creatinine 1.6 on admission, likely prerenal. -IV fluids  overnight -Monitor renal function  Open wound of left ankle: Poorly occurring after surgical resection of melanoma.  Improving per patient's family. -Consult wound care  Hypertension: Currently normotensive to borderline hypotensive. -Holding home lisinopril and HCTZ for AKI -Holding home amlodipine with borderline blood pressure    DVT prophylaxis: Coumadin Code Status: DNR, confirmed with patient and family Family Communication: 2 sons at bedside Disposition Plan: Pending improvement in respiratory status, need for IV antibiotics or supplemental oxygen Consults called: None Admission status: Observation   Zada Finders MD [facility] Pager [phone]  If 7PM-7AM, please contact night-coverage [email or web address] Password [facility]  12/15/2017, 7:47 PM

## 2017-12-15 NOTE — ED Provider Notes (Addendum)
[facility] EMERGENCY DEPARTMENT Provider Note   CSN: [id number] Arrival date & time: 12/15/17  1439     History   Chief Complaint Chief Complaint  Patient presents with  . Pneumonia  . Low O2 sats  . Shortness of Breath    HPI Julie Mosley is a 84 y.o. female.  Level 5 caveat for slightly altered mental status.  Patient was recently admitted to the hospital for pneumonia on 11/23/2017.  She was admitted for 4-5 days, then went to rehab for 10 days.  She was discharged home 4-5 days ago.  She now has increasing dyspnea and malaise along with poor energy.  Her blood pressure this morning was 85/61 with oxygen saturation of 76%.  Her 2 sons help her out at home.  Severity of symptoms is moderate.     Past Medical History:  Diagnosis Date  . Cancer ([place])   . Chronic anticoagulation 12/13/2012   Followed by Dr. Chapman Fitch  . Dilated aortic root ([place]) 12/13/2012   4.5 cm 2013.   Marland Kitchen Hypertension   . Mild aortic stenosis 12/13/2012  . TIA (transient ischemic attack)     Patient Active Problem List   Diagnosis Date Noted  . Hypoxia 12/15/2017  . Pressure injury of skin 11/24/2017  . Community acquired pneumonia 11/23/2017  . Open wound of left ankle 11/23/2017  . Hypokalemia 11/23/2017  . AKI (acute kidney injury) ([place]) 11/23/2017  . Pulmonary nodule less than 6 cm determined by computed tomography of lung 11/23/2017  . Aortic stenosis 12/22/2014  . Persistent atrial fibrillation 12/22/2014  . Postinflammatory pulmonary fibrosis ([place]) 01/15/2014  . Chronic anticoagulation 12/13/2012  . Mild aortic stenosis 12/13/2012  . Dilated aortic root ([place]) 12/13/2012  . A-fib ([place]) 04/14/2012  . History of stroke 04/14/2012  . Essential hypertension, benign 04/14/2012  . Other and unspecified hyperlipidemia 04/14/2012  . Senile osteoporosis 04/14/2012  . Uncontrolled pain 04/03/2012  . Neck pain 04/02/2012  . H/O: CVA (cerebrovascular accident) 04/02/2012  .  Nausea 04/02/2012  . Hypertension   . TIA (transient ischemic attack)     Past Surgical History:  Procedure Laterality Date  . BREAST LUMPECTOMY     bilateral  . CESAREAN SECTION    . HIP SURGERY    . SKIN BIOPSY     From left foot.     OB History   None      Home Medications    Prior to Admission medications   Medication Sig Start Date End Date Taking? Authorizing Provider  acetaminophen (TYLENOL) 650 MG CR tablet Take 650 mg by mouth every 8 (eight) hours as needed for pain.    [provider]  amLODipine (NORVASC) 5 MG tablet TAKE 1 TABLET BY MOUTH  DAILY Patient taking differently: Take 5 mg by mouth daily.  01/23/17   Jerline Pain, MD  Calcium Carbonate-Vitamin D (CALCIUM 600+D) 600-200 MG-UNIT TABS Take 1 tablet by mouth daily.    [provider]  doxycycline (VIBRAMYCIN) 100 MG capsule Take 1 capsule (100 mg total) by mouth 2 (two) times daily. 11/27/17   Caren Griffins, MD  hydrochlorothiazide (HYDRODIURIL) 25 MG tablet TAKE ONE TABLET BY MOUTH ONCE DAILY Patient taking differently: 25 mg daily.  03/01/16   Jerline Pain, MD  lisinopril (PRINIVIL,ZESTRIL) 20 MG tablet TAKE 1 TABLET BY MOUTH TWO  TIMES DAILY Patient taking differently: Take 20 mg by mouth daily.  10/30/17   Jerline Pain, MD  Misc Natural  Products (OSTEO BI-FLEX JOINT SHIELD PO) Take 1 tablet by mouth daily.     [provider]  Multiple Vitamins-Minerals (CENTRUM SILVER PO) Take 1 tablet by mouth daily.     [provider]  Omega-3 Fatty Acids (FISH OIL) 1000 MG CAPS Take 1 capsule by mouth daily.    [provider]  warfarin (COUMADIN) 3 MG tablet Take 3 mg by mouth daily.     [provider]    Family History Family History  Problem Relation Age of Onset  . Heart attack Mother   . Heart attack Sister   . Heart attack Brother     Social History Social History   Tobacco Use  . Smoking status: Former Smoker    Last attempt to quit:  04/03/1975    Years since quitting: 42.7  . Smokeless tobacco: Never Used  Substance Use Topics  . Alcohol use: No  . Drug use: No     Allergies   Valium [diazepam]   Review of Systems Review of Systems  Unable to perform ROS: Acuity of condition     Physical Exam Updated Vital Signs BP 125/75   Pulse 83   Temp 99 F (37.2 C) (Axillary)   Resp (!) 21   Ht 5\' 7"  (1.702 m)   Wt 60 kg   LMP  (LMP Unknown)   SpO2 94%   BMI 20.72 kg/m   Physical Exam  Constitutional: She is oriented to person, place, and time.  Alert, dehydrated  HENT:  Head: Normocephalic and atraumatic.  Eyes: Conjunctivae are normal.  Neck: Neck supple.  Cardiovascular: Normal rate and regular rhythm.  Pulmonary/Chest: Effort normal and breath sounds normal.  Abdominal: Soft. Bowel sounds are normal.  Musculoskeletal: Normal range of motion.  Neurological: She is alert and oriented to person, place, and time.  Skin: Skin is warm and dry.  Psychiatric: She has a normal mood and affect.  Nursing note and vitals reviewed.    ED Treatments / Results  Labs (all labs ordered are listed, but only abnormal results are displayed) Labs Reviewed  COMPREHENSIVE METABOLIC PANEL - Abnormal; Notable for the following components:      Result Value   BUN 31 (*)    Creatinine, Ser 1.66 (*)    Calcium 8.7 (*)    Total Protein 6.1 (*)    Albumin 3.3 (*)    AST 48 (*)    GFR calc non Af Amer 26 (*)    GFR calc Af Amer 30 (*)    All other components within normal limits  CBC WITH DIFFERENTIAL/PLATELET - Abnormal; Notable for the following components:   MCV 101.2 (*)    RDW 16.0 (*)    Platelets 98 (*)    All other components within normal limits  PROTIME-INR - Abnormal; Notable for the following components:   Prothrombin Time 18.7 (*)    All other components within normal limits  I-STAT CG4 LACTIC ACID, ED - Abnormal; Notable for the following components:   Lactic Acid, Venous 3.99 (*)    All other  components within normal limits  URINALYSIS, ROUTINE W REFLEX MICROSCOPIC  I-STAT CG4 LACTIC ACID, ED    EKG None  Radiology Dg Chest 2 View  Result Date: 12/15/2017 CLINICAL DATA:  84 year old female with ongoing shortness of breath, weakness and dry cough for 1 week. Recently diagnosed with pneumonia. EXAM: CHEST - 2 VIEW COMPARISON:  Chest x-ray 11/23/2017.  Chest CT 11/24/2017. FINDINGS: When compared to the  prior chest x-ray from 11/23/2017, there continues to be widespread but patchy peripheral predominant areas of airspace consolidation and ground-glass opacities with some associated interstitial prominence. These appear slightly less confluent than the prior examination, particularly in the region of the right lower lobe, however, the overall distribution is very similar to the prior examination. Trace right pleural effusion. No definite left pleural effusion. No pneumothorax. No evidence of pulmonary edema. Heart size is normal. Upper mediastinal contours are within normal limits. Aortic atherosclerosis. IMPRESSION: 1. Minimal improvement in aeration compared to prior study from 11/23/2017. Findings are favored to reflect a resolving pneumonia, potentially with post inflammatory process such as evolving cryptogenic organizing pneumonia (COP). Based on the appearance of the prior chest CT, the possibility of multifocal neoplasm such as adenocarcinoma could also be considered, but is not strongly favored. As previously suggested, repeat chest CT (preferably high-resolution chest CT) is recommended in early February 2020 to reassess these findings. 2. Aortic atherosclerosis. Electronically Signed   By: Vinnie Langton M.D.   On: 12/15/2017 17:06    Procedures Procedures (including critical care time)  Medications Ordered in ED Medications  vancomycin (VANCOCIN) IVPB 1000 mg/200 mL premix (has no administration in time range)  vancomycin (VANCOCIN) IVPB 1000 mg/200 mL premix (has no  administration in time range)  ceFEPIme (MAXIPIME) 2 g in sodium chloride 0.9 % 100 mL IVPB (has no administration in time range)  sodium chloride 0.9 % bolus 1,500 mL (1,500 mLs Intravenous New Bag/Given 12/15/17 1629)  ceFEPIme (MAXIPIME) 2 g in sodium chloride 0.9 % 100 mL IVPB (2 g Intravenous New Bag/Given 12/15/17 1732)     Initial Impression / Assessment and Plan / ED Course  I have reviewed the triage vital signs and the nursing notes.  Pertinent labs & imaging results that were available during my care of the patient were reviewed by me and considered in my medical decision making (see chart for details).     She returns with dyspnea, malaise, hypotension, hypoxemia.  Lactate elevated.  Sepsis protocol not initiated immediately secondary to normal vital signs.  IV vancomycin, IV Maxipime, IV fluids, admit to general medicine.  CRITICAL CARE Performed by: Nat Christen Total critical care time: 30 minutes Critical care time was exclusive of separately billable procedures and treating other patients. Critical care was necessary to treat or prevent imminent or life-threatening deterioration. Critical care was time spent personally by me on the following activities: development of treatment plan with patient and/or surrogate as well as nursing, discussions with consultants, evaluation of patient's response to treatment, examination of patient, obtaining history from patient or surrogate, ordering and performing treatments and interventions, ordering and review of laboratory studies, ordering and review of radiographic studies, pulse oximetry and re-evaluation of patient's condition.  Final Clinical Impressions(s) / ED Diagnoses   Final diagnoses:  None    ED Discharge Orders    None       Nat Christen, MD 12/15/17 Einar Crow    Nat Christen, MD 12/15/17 [phone]

## 2017-12-15 NOTE — Progress Notes (Signed)
ANTICOAGULATION CONSULT NOTE - Initial Consult  Pharmacy Consult for warfarin Indication: atrial fibrillation  Allergies  Allergen Reactions  . Valium [Diazepam] Other (See Comments)    Stopped breathing    Patient Measurements: Height: 5\' 7"  (170.2 cm) Weight: 132 lb 4.4 oz (60 kg) IBW/kg (Calculated) : 61.6  Vital Signs: Temp: 99 F (37.2 C) (11/22 1459) Temp Source: Axillary (11/22 1459) BP: 112/79 (11/22 1900) Pulse Rate: 76 (11/22 1900)  Labs: Recent Labs    12/15/17 1505 12/15/17 1707  HGB 13.5  --   HCT 43.5  --   PLT 98*  --   LABPROT  --  18.7*  INR  --  1.58  CREATININE 1.66*  --     Estimated Creatinine Clearance: 21.8 mL/min (A) (by C-G formula based on SCr of 1.66 mg/dL (H)).   Medical History: Past Medical History:  Diagnosis Date  . Cancer ([place])   . Chronic anticoagulation 12/13/2012   Followed by Dr. Chapman Fitch  . Dilated aortic root ([place]) 12/13/2012   4.5 cm 2013.   Marland Kitchen Hypertension   . Mild aortic stenosis 12/13/2012  . TIA (transient ischemic attack)     Assessment: Patient 25 yoF presenting with shortness of breath. Pharmacy has been consulted for warfarin dosing. PTA patient was taking warfarin 3 mg PO QD for atrial fibrillation. Last dose yesterday 12/15/17.   INR this evening is subtherapeutic at 1.58. HgB wnl and PLT low at 98 (ranged 93-134 over past month).   Goal of Therapy:  INR 2-3  Plan:  Warfarin 4 mg PO x1 this evening Monitor daily INR and s/sx of bleeding  Willia Craze, Pharmacy Student

## 2017-12-15 NOTE — ED Triage Notes (Signed)
Pt had a diagnosis of pneumonia. She took antibiotics. Pt reports she still feels weak and generally tired. Pt noted to be 88% on room air. Placed on 2L O2 via nasal cannula, now 99%. Alert and oriented. Pt has a dry cough. Pt also has a wound to her left ankle.

## 2017-12-15 NOTE — Progress Notes (Signed)
Pharmacy Antibiotic Note  Julie Mosley is a 84 y.o. female who presented to the [facility] on 12/15/2017 with persistent PNA. The patient was recently admitted 10/31-11/5 and treated for CAP with Rocephin + Azithro >> po Doxy at discharge.  Pharmacy has been consulted for Vancomycin and Cefepime doing.    The patient is noted to have AKI with SCr 1.66 (BL <1), estimated CrCl<20 ml/min. Will dose Vanc conservatively at q48h however if SCr continues to climb - would need to consider checking a Vanc random prior to re-dose to ensure patient is clearing.   Plan: - Vanc 1g IV x 1 followed by 1g IV every 48 hours - Cefepime 2g IV x 1 followed by 2g IV every 24 hours - Will continue to follow renal function, culture results, LOT, and antibiotic de-escalation plans      Temp (24hrs), Avg:98.4 F (36.9 C), Min:97.7 F (36.5 C), Max:99 F (37.2 C)  Recent Labs  Lab 12/15/17 1505 12/15/17 1518  WBC 5.2  --   CREATININE 1.66*  --   LATICACIDVEN  --  3.99*    CrCl cannot be calculated (Unknown ideal weight.).    Allergies  Allergen Reactions  . Valium [Diazepam] Other (See Comments)    Stopped breathing    Antimicrobials this admission: Vanc 11/22 >> Cefepime 11/22 >>  Dose adjustments this admission: n/a  Microbiology results: No cx yet  Thank you for allowing pharmacy to be a part of this patient's care.  Alycia Rossetti, PharmD, BCPS Clinical Pharmacist If after 3:30p, please call main pharmacy at: [phone] Please check AMION for all [facility] numbers 12/15/2017 4:48 PM

## 2017-12-15 NOTE — ED Notes (Signed)
Critical Lab - Lactic Acid: 3.99 mmol/L

## 2017-12-15 NOTE — ED Notes (Signed)
Patient transported to X-ray 

## 2017-12-16 ENCOUNTER — Encounter (HOSPITAL_COMMUNITY): Payer: Self-pay | Admitting: Internal Medicine

## 2017-12-16 ENCOUNTER — Inpatient Hospital Stay (HOSPITAL_COMMUNITY): Payer: Medicare Other

## 2017-12-16 DIAGNOSIS — Z8673 Personal history of transient ischemic attack (TIA), and cerebral infarction without residual deficits: Secondary | ICD-10-CM | POA: Diagnosis not present

## 2017-12-16 DIAGNOSIS — K219 Gastro-esophageal reflux disease without esophagitis: Secondary | ICD-10-CM | POA: Diagnosis present

## 2017-12-16 DIAGNOSIS — E876 Hypokalemia: Secondary | ICD-10-CM | POA: Diagnosis present

## 2017-12-16 DIAGNOSIS — E860 Dehydration: Secondary | ICD-10-CM | POA: Diagnosis present

## 2017-12-16 DIAGNOSIS — I350 Nonrheumatic aortic (valve) stenosis: Secondary | ICD-10-CM | POA: Diagnosis present

## 2017-12-16 DIAGNOSIS — Z7901 Long term (current) use of anticoagulants: Secondary | ICD-10-CM | POA: Diagnosis not present

## 2017-12-16 DIAGNOSIS — R0902 Hypoxemia: Secondary | ICD-10-CM | POA: Diagnosis not present

## 2017-12-16 DIAGNOSIS — I4819 Other persistent atrial fibrillation: Secondary | ICD-10-CM | POA: Diagnosis present

## 2017-12-16 DIAGNOSIS — T17908S Unspecified foreign body in respiratory tract, part unspecified causing other injury, sequela: Secondary | ICD-10-CM | POA: Diagnosis not present

## 2017-12-16 DIAGNOSIS — Z87891 Personal history of nicotine dependence: Secondary | ICD-10-CM | POA: Diagnosis not present

## 2017-12-16 DIAGNOSIS — R918 Other nonspecific abnormal finding of lung field: Secondary | ICD-10-CM | POA: Diagnosis not present

## 2017-12-16 DIAGNOSIS — N179 Acute kidney failure, unspecified: Secondary | ICD-10-CM

## 2017-12-16 DIAGNOSIS — I10 Essential (primary) hypertension: Secondary | ICD-10-CM | POA: Diagnosis present

## 2017-12-16 DIAGNOSIS — R911 Solitary pulmonary nodule: Secondary | ICD-10-CM | POA: Diagnosis present

## 2017-12-16 DIAGNOSIS — Z85820 Personal history of malignant melanoma of skin: Secondary | ICD-10-CM | POA: Diagnosis not present

## 2017-12-16 DIAGNOSIS — Z66 Do not resuscitate: Secondary | ICD-10-CM | POA: Diagnosis present

## 2017-12-16 DIAGNOSIS — Z79899 Other long term (current) drug therapy: Secondary | ICD-10-CM | POA: Diagnosis not present

## 2017-12-16 DIAGNOSIS — I2510 Atherosclerotic heart disease of native coronary artery without angina pectoris: Secondary | ICD-10-CM | POA: Diagnosis present

## 2017-12-16 DIAGNOSIS — J9601 Acute respiratory failure with hypoxia: Secondary | ICD-10-CM

## 2017-12-16 DIAGNOSIS — J849 Interstitial pulmonary disease, unspecified: Secondary | ICD-10-CM | POA: Diagnosis not present

## 2017-12-16 DIAGNOSIS — Z515 Encounter for palliative care: Secondary | ICD-10-CM | POA: Diagnosis not present

## 2017-12-16 DIAGNOSIS — Z7189 Other specified counseling: Secondary | ICD-10-CM | POA: Diagnosis not present

## 2017-12-16 LAB — BASIC METABOLIC PANEL
ANION GAP: 8 (ref 5–15)
BUN: 27 mg/dL — AB (ref 8–23)
CHLORIDE: 105 mmol/L (ref 98–111)
CO2: 23 mmol/L (ref 22–32)
Calcium: 7.8 mg/dL — ABNORMAL LOW (ref 8.9–10.3)
Creatinine, Ser: 1.22 mg/dL — ABNORMAL HIGH (ref 0.44–1.00)
GFR calc non Af Amer: 38 mL/min — ABNORMAL LOW (ref >60)
GFR, EST AFRICAN AMERICAN: 44 mL/min — AB (ref >60)
GLUCOSE: 76 mg/dL (ref 70–99)
Potassium: 3.3 mmol/L — ABNORMAL LOW (ref 3.5–5.1)
SODIUM: 136 mmol/L (ref 135–145)

## 2017-12-16 LAB — URINALYSIS, ROUTINE W REFLEX MICROSCOPIC
BILIRUBIN URINE: NEGATIVE
Glucose, UA: NEGATIVE mg/dL
Hgb urine dipstick: NEGATIVE
Ketones, ur: NEGATIVE mg/dL
Nitrite: NEGATIVE
PH: 5 (ref 5.0–8.0)
Protein, ur: NEGATIVE mg/dL
SPECIFIC GRAVITY, URINE: 1.014 (ref 1.005–1.030)

## 2017-12-16 LAB — EXPECTORATED SPUTUM ASSESSMENT W REFEX TO RESP CULTURE

## 2017-12-16 LAB — PROTIME-INR
INR: 1.65
Prothrombin Time: 19.3 seconds — ABNORMAL HIGH (ref 11.4–15.2)

## 2017-12-16 LAB — EXPECTORATED SPUTUM ASSESSMENT W GRAM STAIN, RFLX TO RESP C

## 2017-12-16 LAB — CBC
HEMATOCRIT: 35.7 % — AB (ref 36.0–46.0)
Hemoglobin: 11.5 g/dL — ABNORMAL LOW (ref 12.0–15.0)
MCH: 31.5 pg (ref 26.0–34.0)
MCHC: 32.2 g/dL (ref 30.0–36.0)
MCV: 97.8 fL (ref 80.0–100.0)
Platelets: 84 10*3/uL — ABNORMAL LOW (ref 150–400)
RBC: 3.65 MIL/uL — ABNORMAL LOW (ref 3.87–5.11)
RDW: 15.8 % — ABNORMAL HIGH (ref 11.5–15.5)
WBC: 4.3 10*3/uL (ref 4.0–10.5)
nRBC: 0 % (ref 0.0–0.2)

## 2017-12-16 LAB — MAGNESIUM: Magnesium: 1.5 mg/dL — ABNORMAL LOW (ref 1.7–2.4)

## 2017-12-16 MED ORDER — WARFARIN SODIUM 4 MG PO TABS
4.0000 mg | ORAL_TABLET | Freq: Once | ORAL | Status: AC
  Administered 2017-12-16: 4 mg via ORAL
  Filled 2017-12-16: qty 1

## 2017-12-16 MED ORDER — METHYLPREDNISOLONE SODIUM SUCC 40 MG IJ SOLR
40.0000 mg | Freq: Three times a day (TID) | INTRAMUSCULAR | Status: DC
  Administered 2017-12-16 – 2017-12-19 (×9): 40 mg via INTRAVENOUS
  Filled 2017-12-16 (×9): qty 1

## 2017-12-16 MED ORDER — ALBUTEROL SULFATE (2.5 MG/3ML) 0.083% IN NEBU
2.5000 mg | INHALATION_SOLUTION | RESPIRATORY_TRACT | Status: DC
  Administered 2017-12-16: 2.5 mg via RESPIRATORY_TRACT
  Filled 2017-12-16: qty 3

## 2017-12-16 MED ORDER — POTASSIUM CHLORIDE CRYS ER 20 MEQ PO TBCR
40.0000 meq | EXTENDED_RELEASE_TABLET | Freq: Two times a day (BID) | ORAL | Status: AC
  Administered 2017-12-16 (×2): 40 meq via ORAL
  Filled 2017-12-16 (×2): qty 2

## 2017-12-16 MED ORDER — ALBUTEROL SULFATE (2.5 MG/3ML) 0.083% IN NEBU
2.5000 mg | INHALATION_SOLUTION | Freq: Three times a day (TID) | RESPIRATORY_TRACT | Status: DC
  Administered 2017-12-16 – 2017-12-17 (×4): 2.5 mg via RESPIRATORY_TRACT
  Filled 2017-12-16 (×4): qty 3

## 2017-12-16 MED ORDER — PANTOPRAZOLE SODIUM 40 MG PO TBEC
40.0000 mg | DELAYED_RELEASE_TABLET | Freq: Two times a day (BID) | ORAL | Status: DC
  Administered 2017-12-16 – 2017-12-20 (×9): 40 mg via ORAL
  Filled 2017-12-16 (×9): qty 1

## 2017-12-16 MED ORDER — VANCOMYCIN HCL 500 MG IV SOLR
500.0000 mg | INTRAVENOUS | Status: DC
  Administered 2017-12-16: 500 mg via INTRAVENOUS
  Filled 2017-12-16: qty 500

## 2017-12-16 NOTE — Progress Notes (Signed)
  Patient DC'd from [facility] to [facility] 11/5. Per daughter in law at bedside she was approved for 21 days and stayed 83 or 11. They are wanting dispo tio whatever PT recommends. Patient was most recently active w [facility] after being DC'd from SNF. If DC to home will need new [facility] orders and face to face.

## 2017-12-16 NOTE — Progress Notes (Addendum)
[facility] PROGRESS NOTE  KATILYN MILTENBERGER VOJ:[id number] DOB: 1926/08/13 DOA: 12/15/2017 PCP: Dineen Kid, MD  Assessment/Plan:  1. Acute respiratory failure with hypoxia likely related to HCAP in setting of postinflammatory changes. Oxygen saturation level reportedly 74% on room air in field and 88% on room air upon presentation. Provided with oxygen supplementation and antibiotics and oxygen saturation level improved. Early this am, oxygen saturation level dropped to 83% on 3L. Provided with nebs and improved to 92% on 3L. Chest xray reveals minimal improvement in aeration compared to prior study from 11/23/2017. Findings are favored to reflect a resolving pneumonia, potentially with post inflammatory process such as evolving cryptogenic organizing pneumonia (COP). Based on the appearance of the prior chest CT, the possibility of multifocal neoplasm such as adenocarcinoma could also be considered, but is not strongly favored. As previously suggested, repeat chest CT (preferably high-resolution chest CT) is recommended in early February 2020 to reassess these findings. -continue oxygen supplementation -continue scheduled nebs -solumedrol -incentive spirometry -repeat CT chest w/o contrast -dc IV fluids -monitor closely -if no improvement strongly consider pulmonary consult  2. Acute kidney Injury likely related to decreased oral intake in setting of ace inhibitor and HCTZ. Received IV fluids and creatinine trending down this am -monitor urine output -continue to hold nephrotoxins -recheck in am  3. HCAP. Xray as noted above. Initially elevated lactic acid but resolved with IV fluids. Remained hemodynamically stable albeit BP on low end of normal at times.  Started on broad spectrum antibiotics. Is afebrile and non-toxic appearing.  -see #1 -follow blood cultures -sputum cultures as able -narrow antibiotics as indicated -mobilize -incentive spirometry  4. Hypokalemia. Likely related  to decreased po intake in setting of HCTZ.  -replete -check mag level -recheck in am  5. Afib. Rate controlled. inr 1.6. -coumadin per pharmacy  6. Hypertension. initally BP low end of normal. Provided IV fluids. This am improved.  -monitor closely -hold HCTZ and lisinopril  7.Postinflammotory pulmonary process/pulmonary nodule/esophageal reflux. Per CT dated 11/24/2017. Unclear significance and contribution to #1.  -consider solumedrol -see #1 -PPI -speech eval -follow up CT 02/2018  8.wound left ankle -wound care  Code Status: full Family Communication: none present Disposition Plan: to be determined   Consultants:  none  Procedures:  none  Antibiotics:  Cefepime 11/22>> Vancomycin 11/22>>>  HPI/Subjective: Admitted with respiratory failure presumable related to HCAP in setting of postinflammatory process. Increased oxygen demand demonstrated this am. Improved after nebs  Objective: Vitals:   12/16/17 0659 12/16/17 0812  BP:    Pulse:    Resp:    Temp:    SpO2: 92% 91%    Intake/Output Summary (Last 24 hours) at 12/16/2017 0817 Last data filed at 12/16/2017 0300 Gross per 24 hour  Intake 649.32 ml  Output -  Net 649.32 ml   Filed Weights   12/15/17 1615 12/15/17 1951  Weight: 60 kg 54 kg    Exam:   General:  Thin frail cachetic in no acute distress  Cardiovascular: irregularly irregular +murmur no LE edema  Respiratory: mild increased work of breathing with conversation. Fair air movement with scattered rhonchi and faint crackles left base. Tight sounding cough during exam  Abdomen: non-distended +BS non-tender no guarding or rebounding  Musculoskeletal: joints without swelling/erythema   Data Reviewed: Basic Metabolic Panel: Recent Labs  Lab 12/15/17 1505 12/16/17 0317  NA 138 136  K 3.8 3.3*  CL 102 105  CO2 24 23  GLUCOSE 98 76  BUN 31* 27*  CREATININE 1.66* 1.22*  CALCIUM 8.7* 7.8*   Liver Function Tests: Recent Labs   Lab 12/15/17 1505  AST 48*  ALT 21  ALKPHOS 84  BILITOT 0.9  PROT 6.1*  ALBUMIN 3.3*   No results for input(s): LIPASE, AMYLASE in the last 168 hours. No results for input(s): AMMONIA in the last 168 hours. CBC: Recent Labs  Lab 12/15/17 1505 12/16/17 0317  WBC 5.2 4.3  NEUTROABS 3.9  --   HGB 13.5 11.5*  HCT 43.5 35.7*  MCV 101.2* 97.8  PLT 98* 84*   Cardiac Enzymes: No results for input(s): CKTOTAL, CKMB, CKMBINDEX, TROPONINI in the last 168 hours. BNP (last 3 results) No results for input(s): BNP in the last 8760 hours.  ProBNP (last 3 results) No results for input(s): PROBNP in the last 8760 hours.  CBG: No results for input(s): GLUCAP in the last 168 hours.  No results found for this or any previous visit (from the past 240 hour(s)).   Studies: Dg Chest 2 View  Result Date: 12/15/2017 CLINICAL DATA:  84 year old female with ongoing shortness of breath, weakness and dry cough for 1 week. Recently diagnosed with pneumonia. EXAM: CHEST - 2 VIEW COMPARISON:  Chest x-ray 11/23/2017.  Chest CT 11/24/2017. FINDINGS: When compared to the prior chest x-ray from 11/23/2017, there continues to be widespread but patchy peripheral predominant areas of airspace consolidation and ground-glass opacities with some associated interstitial prominence. These appear slightly less confluent than the prior examination, particularly in the region of the right lower lobe, however, the overall distribution is very similar to the prior examination. Trace right pleural effusion. No definite left pleural effusion. No pneumothorax. No evidence of pulmonary edema. Heart size is normal. Upper mediastinal contours are within normal limits. Aortic atherosclerosis. IMPRESSION: 1. Minimal improvement in aeration compared to prior study from 11/23/2017. Findings are favored to reflect a resolving pneumonia, potentially with post inflammatory process such as evolving cryptogenic organizing pneumonia (COP).  Based on the appearance of the prior chest CT, the possibility of multifocal neoplasm such as adenocarcinoma could also be considered, but is not strongly favored. As previously suggested, repeat chest CT (preferably high-resolution chest CT) is recommended in early February 2020 to reassess these findings. 2. Aortic atherosclerosis. Electronically Signed   By: Vinnie Langton M.D.   On: 12/15/2017 17:06    Scheduled Meds: . albuterol  2.5 mg Nebulization Q4H  . guaiFENesin  600 mg Oral BID  . pantoprazole  40 mg Oral BID  . potassium chloride  40 mEq Oral BID  . Warfarin - Pharmacist Dosing Inpatient   Does not apply q1800   Continuous Infusions: . ceFEPime (MAXIPIME) IV    . [START ON 12/17/2017] vancomycin      Principal Problem:   Acute respiratory failure with hypoxia (HCC) Active Problems:   Postinflammatory pulmonary fibrosis (HCC)   Pneumonia   AKI (acute kidney injury) (HCC)   Lung nodule seen on imaging study   Hypertension   A-fib (HCC)   Hypokalemia   Pulmonary nodule less than 6 cm determined by computed tomography of lung   Esophageal reflux   Open wound of left ankle    Time spent: 45 minutes    [place] NP  [facility]  If 7PM-7AM, please contact night-coverage at [email or web address], password [facility] 12/16/2017, 8:17 AM  LOS: 0 days      Patient was seen, examined,treatment plan was discussed with the Advance Practice Provider.  I have personally reviewed the clinical findings, labs,  EKG, imaging studies and management of this patient in detail. I have also reviewed the orders written for this patient which were under my direction. I agree with the documentation, as recorded by the Advance Practice Provider.   NEMESIS RAINWATER is a 84 y.o. female with recent hospitalization for pneumonia. She left the hospital and went to rehab.  Only improved slightly per son and then quickly worsened again once off her abx.  Chart review shows there was a ? Diagnosis  of pulmonary fibrosis and patient saw Dr. Melvyn Novas x 1 in 2015.  Prior to this pneumonia she was very active in her yard/house/community.  She has lost 20 lbs in last 1 month. Plan:  pulm toilet Abx/steroids Nebs Repeat CT Scan of chest -would strongly consider pulm consult in hospital based on the above findings  Patient is extremely dyspneic with talking/eating but since breathing treatments started this AM, her cough has become more productive.  Due to likelihood of deterioration in breathing status and need for continues work up, plan to change to inpatient.  Long discussion with sons at bedside.   Geradine Girt, DO

## 2017-12-16 NOTE — Evaluation (Addendum)
Occupational Therapy Evaluation Patient Details Name: Julie Mosley MRN: [id number] DOB: [birth year] Today's Date: 12/16/2017    History of Present Illness 84 y.o. female who presented to the [facility] on 12/15/2017 with persistent PNA. The patient was recently admitted 10/31-11/5 and treated for HCAP, D/Cd to a rehab facility; returned home but developed progressive SOB, fatigue. PMH includes A-fib on Coumadin, TIA, aortic stenosis, LBBB and melanoma surgically removed.    Clinical Impression   PTA, pt was living alone and had recently returned home from SNF rehab. Prior to previous admission, pt was independent with ADLs and IADLs. Pt currently requiring Min A for LB ADLs and functional mobility. Upon arrival, pt was sitting at EOB with son at bedside and SpO2 at 86% on RA. Providing pt with education on purse lip breathing and elevated O2 to 2-5L to facilitate pt in achieving 90% SpO2. Pt only able to reach 88% SpO2 and will drop during conversation and activity. Pt would benefit from further acute OT to facilitate safe dc. Recommend dc to SNF for further OT to optimize safety, independence with ADLs, and return to PLOF.       Follow Up Recommendations  SNF;Supervision/Assistance - 24 hour ; pending pt progress   Equipment Recommendations  Tub/shower seat;Other (comment)(Defer to next venue)    Recommendations for Other Services PT consult     Precautions / Restrictions Precautions Precautions: Fall Precaution Comments: Watch SpO2      Mobility Bed Mobility               General bed mobility comments: Pt at EOB upon arrival  Transfers Overall transfer level: Needs assistance Equipment used: None Transfers: Sit to/from [facility] Sit to Stand: Min guard Stand pivot transfers: Min assist       General transfer comment: Min A for balance and safety during pivot to recliner    Balance Overall balance assessment: Needs assistance Sitting-balance support:  No upper extremity supported;Feet supported Sitting balance-Leahy Scale: Good     Standing balance support: No upper extremity supported;During functional activity Standing balance-Leahy Scale: Fair                             ADL either performed or assessed with clinical judgement   ADL Overall ADL's : Needs assistance/impaired Eating/Feeding: Supervision/ safety;Set up;Sitting   Grooming: Supervision/safety;Set up;Sitting   Upper Body Bathing: Supervision/ safety;Set up;Sitting   Lower Body Bathing: Minimal assistance;Sit to/from stand   Upper Body Dressing : Supervision/safety;Set up;Sitting   Lower Body Dressing: Minimal assistance;Sit to/from stand   Toilet Transfer: Minimal assistance;Stand-pivot(to recliner) Armed forces technical officer Details (indicate cue type and reason): Min A for balance and safety. Main deficits is SpO2 levels main           General ADL Comments: Pt with decreased activity tolerance as seen by fatigue and low SpO2. Pt stating at 80-70s on RA. Elevating pt to 3-5L O2 and her Spo2 elevated to 88% at best.     Vision         Perception     Praxis      Pertinent Vitals/Pain Pain Assessment: Faces Faces Pain Scale: Hurts a little bit Pain Location: Generalized; with coughing Pain Descriptors / Indicators: Grimacing Pain Intervention(s): Monitored during session     Hand Dominance Right   Extremity/Trunk Assessment Upper Extremity Assessment Upper Extremity Assessment: Generalized weakness   Lower Extremity Assessment Lower Extremity Assessment: Defer to PT evaluation  Cervical / Trunk Assessment Cervical / Trunk Assessment: Kyphotic   Communication Communication Communication: No difficulties   Cognition Arousal/Alertness: Awake/alert Behavior During Therapy: WFL for tasks assessed/performed Overall Cognitive Status: Within Functional Limits for tasks assessed                                 General  Comments: Pt veralized frustration with current medical siutation and continued illness   General Comments  Son present throughout session. Educating pt on purse lip breathing and SpO2 level. Upon arrival, pt SpO2 at 83% on RA.    Exercises     Shoulder Instructions      Home Living Family/patient expects to be discharged to:: Private residence Living Arrangements: Children Available Help at Discharge: Family;Available PRN/intermittently Type of Home: House Home Access: Stairs to enter [employer] of Steps: 5 Entrance Stairs-Rails: Left Home Layout: One level     Bathroom Shower/Tub: Teacher, early years/pre: Handicapped height     Home Equipment: Environmental consultant - 2 wheels;Cane - single point          Prior Functioning/Environment Level of Independence: Independent;Needs assistance  Gait / Transfers Assistance Needed: Has cane and RW but does not use them. Furnature walks. ADL's / Homemaking Assistance Needed: BADLs recently   Comments: Prior to recent admittion from 11/23/2017-11/28/2017 for pneumonia, pt was independent with ADLs, IADLs, and driving. Enjoyed working in her yard ("I was always outside"). Pt went ot SNF for rehab after prior admission and was home.        OT Problem List: Decreased strength;Decreased range of motion;Decreased activity tolerance;Impaired balance (sitting and/or standing);Decreased knowledge of use of DME or AE;Decreased knowledge of precautions;Pain      OT Treatment/Interventions: Self-care/ADL training;Therapeutic exercise;Energy conservation;DME and/or AE instruction;Therapeutic activities;Patient/family education    OT Goals(Current goals can be found in the care plan section) Acute Rehab OT Goals Patient Stated Goal: "Stop coughing and be able to work in my yard." OT Goal Formulation: With patient/family Time For Goal Achievement: 12/30/17 Potential to Achieve Goals: Good  OT Frequency: Min 2X/week   Barriers to D/C:             Co-evaluation              AM-PAC OT "6 Clicks" Daily Activity     Outcome Measure Help from another person eating meals?: None Help from another person taking care of personal grooming?: A Little Help from another person toileting, which includes using toliet, bedpan, or urinal?: A Little Help from another person bathing (including washing, rinsing, drying)?: A Little Help from another person to put on and taking off regular upper body clothing?: A Little Help from another person to put on and taking off regular lower body clothing?: A Little 6 Click Score: 19   End of Session Equipment Utilized During Treatment: Oxygen(3-5L) Nurse Communication: Mobility status;Other (comment)(SpO2)  Activity Tolerance: Patient tolerated treatment well Patient left: in chair;with call bell/phone within reach;with family/visitor present  OT Visit Diagnosis: Unsteadiness on feet (R26.81);Other abnormalities of gait and mobility (R26.89);Muscle weakness (generalized) (M62.81);Pain Pain - part of body: (Generalized)                Time: 0626-9485 OT Time Calculation (min): 34 min Charges:  OT General Charges $OT Visit: 1 Visit OT Evaluation $OT Eval Moderate Complexity: 1 Mod OT Treatments $Self Care/Home Management : 8-22 mins  Haydin Dunn MSOT, OTR/L Acute Rehab  Pager: [phone] Office: [place] 12/16/2017, 12:36 PM

## 2017-12-16 NOTE — Progress Notes (Signed)
Pt exp Mild SOB desat at 83%-84% on 3L/O2. RN informed NP. NP at bedside. Pt given Neb tx w/ NS rate decreased to 56mL/Hr

## 2017-12-16 NOTE — Progress Notes (Signed)
Julie Mosley is a 84 y.o. female patient admitted from ED awake, alert - oriented  X 4 - no acute distress noted.  VSS - Blood pressure 124/73, pulse 81, temperature 98.3 F (36.8 C), temperature source Oral, resp. rate 18, height 5\' 7"  (1.702 m), weight 55.7 kg, SpO2 91 %.    IV in place, occlusive dsg intact without redness.  Orientation to room, and floor completed with information packet given to patient/family.  Patient declined safety video at this time.  Admission INP armband ID verified with patient/family, and in place.   SR up x 2, fall assessment complete, with patient and family able to verbalize understanding of risk associated with falls, and verbalized understanding to call nsg before up out of bed.  Call light within reach, patient able to voice, and demonstrate understanding.  Skin, clean-dry- intact without evidence of bruising, or skin tears.   No evidence of skin break down noted on exam.     Will cont to eval and treat per MD orders.  Holley Raring, RN 12/16/2017 4:54 PM

## 2017-12-16 NOTE — Progress Notes (Signed)
Pharmacy Antibiotic Note  Julie Mosley is a 84 y.o. female who presented to the [facility] on 12/15/2017 with persistent PNA. The patient was recently admitted 10/31-11/5 and treated for CAP with Rocephin + Azithro >> po Doxy at discharge.  Pharmacy has been consulted for Vancomycin and Cefepime doing.     ID: Abx D#2 for persistent PNA. Afeb, WBC wnl, LA 3.99 Tm 98.1  AKI:  Scr 1.66 improved, down to 1.22 , crcl 27.5 ml/min   Plan: - Vanc adjusted dose to 500mg  IV q24h d/t Scr improved - Cefepime 2g IV x 1 followed by 2g IV every 24 hours - Will continue to follow renal function, culture results, LOT, and antibiotic de-escalation plans   Height: 5\' 7"  (170.2 cm) Weight: 122 lb 12.7 oz (55.7 kg) IBW/kg (Calculated) : 61.6  Temp (24hrs), Avg:97.9 F (36.6 C), Min:97.6 F (36.4 C), Max:98.1 F (36.7 C)  Recent Labs  Lab 12/15/17 1505 12/15/17 1518 12/15/17 1812 12/16/17 0317  WBC 5.2  --   --  4.3  CREATININE 1.66*  --   --  1.22*  LATICACIDVEN  --  3.99* 1.25  --     Estimated Creatinine Clearance: 27.5 mL/min (A) (by C-G formula based on SCr of 1.22 mg/dL (H)).    Allergies  Allergen Reactions  . Valium [Diazepam] Other (See Comments)    Stopped breathing    Antimicrobials this admission: Vanc 11/22 >> Cefepime 11/22 >>  Dose adjustments this admission: n/a  Microbiology results: 11/22 BCx x2: ngtd   Thank you for allowing pharmacy to be a part of this patient's care.  Nicole Cella, RPh Clinical Pharmacist If after 3:30p, please call main pharmacy at: [phone] Please check AMION for all [place] numbers 12/16/2017 4:53 PM

## 2017-12-16 NOTE — Evaluation (Signed)
Physical Therapy Evaluation Patient Details Name: Julie Mosley MRN: [id number] DOB: Mar 22, 1926 Today's Date: 12/16/2017   History of Present Illness  84 y.o. female who presented to the [facility] on 12/15/2017 with persistent PNA. The patient was recently admitted 10/31-11/5 and treated for HCAP, D/Cd to a rehab facility; returned home but developed progressive SOB, fatigue. PMH includes A-fib on Coumadin, TIA, aortic stenosis, LBBB and melanoma surgically removed.   Clinical Impression  Prior to admission, patient had recent returned home from SNF rehab. On PT evaluation, patient presenting with decreased functional mobility secondary to dyspnea on exertion, generalized weakness, and balance impairments. Pt requiring min assist for transfers and min guard for ambulating 15 feet in room. 90% SpO2 on 5L O2 at rest with desaturation to 86% SpO2 during mobility, DOE 3/4. Pt extremely fatigued and dyspneic after limited ambulation. Also limited by increased urinary frequency. Recommend d/c to SNF to maximize functional independence. Will follow acutely.     Follow Up Recommendations SNF    Equipment Recommendations  None recommended by PT    Recommendations for Other Services       Precautions / Restrictions Precautions Precautions: Fall Precaution Comments: Watch SpO2, urinary frequency Restrictions Weight Bearing Restrictions: No      Mobility  Bed Mobility Overal bed mobility: Needs Assistance Bed Mobility: Supine to Sit;Sit to Supine     Supine to sit: Supervision Sit to supine: Min assist   General bed mobility comments: min assist for LE negotiation back into bed  Transfers Overall transfer level: Needs assistance Equipment used: None Transfers: Sit to/from [facility] Sit to Stand: Min assist         General transfer comment: Min assist to steady with transfers from bed and [facility]  Ambulation/Gait Ambulation/Gait assistance: Min guard Gait Distance  (Feet): 15 Feet Assistive device: None Gait Pattern/deviations: Step-through pattern;Decreased stride length;Drifts right/left;Narrow base of support Gait velocity: decreased Gait velocity interpretation: <1.31 ft/sec, indicative of household ambulator General Gait Details: Mild unsteadiness noted and decreased arm swing  Stairs            Wheelchair Mobility    Modified Rankin (Stroke Patients Only)       Balance Overall balance assessment: Needs assistance Sitting-balance support: No upper extremity supported;Feet supported Sitting balance-Leahy Scale: Good     Standing balance support: No upper extremity supported;During functional activity Standing balance-Leahy Scale: Fair                               Pertinent Vitals/Pain Pain Assessment: Faces Faces Pain Scale: Hurts a little bit Pain Location: Generalized; with coughing Pain Descriptors / Indicators: Grimacing Pain Intervention(s): Monitored during session    Home Living Family/patient expects to be discharged to:: Private residence Living Arrangements: Children Available Help at Discharge: Family;Available PRN/intermittently Type of Home: House Home Access: Stairs to enter Entrance Stairs-Rails: Left Entrance Stairs-Number of Steps: 5 Home Layout: One level Home Equipment: Walker - 2 wheels;Cane - single point      Prior Function Level of Independence: Independent;Needs assistance   Gait / Transfers Assistance Needed: Has cane and RW but does not use them. Furnature walks.  ADL's / Homemaking Assistance Needed: BADLs recently  Comments: Prior to recent admittion from 11/23/2017-11/28/2017 for pneumonia, pt was independent with ADLs, IADLs, and driving. Enjoyed working in her yard ("I was always outside"). Pt went ot SNF for rehab after prior admission and was home.  Hand Dominance   Dominant Hand: Right    Extremity/Trunk Assessment   Upper Extremity Assessment Upper Extremity  Assessment: Generalized weakness    Lower Extremity Assessment Lower Extremity Assessment: Generalized weakness    Cervical / Trunk Assessment Cervical / Trunk Assessment: Kyphotic  Communication   Communication: No difficulties  Cognition Arousal/Alertness: Awake/alert Behavior During Therapy: WFL for tasks assessed/performed Overall Cognitive Status: Within Functional Limits for tasks assessed                                        General Comments      Exercises     Assessment/Plan    PT Assessment Patient needs continued PT services  PT Problem List Decreased strength;Decreased mobility;Cardiopulmonary status limiting activity;Pain;Decreased balance;Decreased activity tolerance       PT Treatment Interventions Functional mobility training;Balance training;Patient/family education;Gait training;Therapeutic activities;Stair training;Therapeutic exercise    PT Goals (Current goals can be found in the Care Plan section)  Acute Rehab PT Goals Patient Stated Goal: "Stop coughing and be able to work in my yard." PT Goal Formulation: With patient/family Time For Goal Achievement: 12/30/17    Frequency Min 3X/week   Barriers to discharge Decreased caregiver support      Co-evaluation               AM-PAC PT "6 Clicks" Mobility  Outcome Measure Help needed turning from your back to your side while in a flat bed without using bedrails?: None Help needed moving from lying on your back to sitting on the side of a flat bed without using bedrails?: A Little Help needed moving to and from a bed to a chair (including a wheelchair)?: A Little Help needed standing up from a chair using your arms (e.g., wheelchair or bedside chair)?: A Little Help needed to walk in hospital room?: A Little Help needed climbing 3-5 steps with a railing? : A Lot 6 Click Score: 18    End of Session Equipment Utilized During Treatment: Oxygen Activity Tolerance: Other  (comment)(limited by dyspnea and urinary frequency) Patient left: in bed;with call bell/phone within reach;with family/visitor present Nurse Communication: Mobility status PT Visit Diagnosis: Muscle weakness (generalized) (M62.81);Unsteadiness on feet (R26.81)    Time: 1523-1550 PT Time Calculation (min) (ACUTE ONLY): 27 min   Charges:   PT Evaluation $PT Eval Moderate Complexity: 1 Mod PT Treatments $Therapeutic Activity: 8-22 mins        Ellamae Sia, PT, DPT Acute Rehabilitation Services Pager [phone] Office [phone]   Willy Eddy 12/16/2017, 5:06 PM

## 2017-12-16 NOTE — Progress Notes (Signed)
ANTICOAGULATION CONSULT NOTE - follow up Pharmacy Consult for warfarin Indication: atrial fibrillation  Allergies  Allergen Reactions  . Valium [Diazepam] Other (See Comments)    Stopped breathing    Patient Measurements: Height: 5\' 7"  (170.2 cm) Weight: 122 lb 12.7 oz (55.7 kg) IBW/kg (Calculated) : 61.6  Vital Signs: Temp: 98.1 F (36.7 C) (11/23 1431) Temp Source: Oral (11/23 1431) BP: 115/74 (11/23 1431) Pulse Rate: 83 (11/23 1431)  Labs: Recent Labs    12/15/17 1505 12/15/17 1707 12/16/17 0317  HGB 13.5  --  11.5*  HCT 43.5  --  35.7*  PLT 98*  --  84*  LABPROT  --  18.7* 19.3*  INR  --  1.58 1.65  CREATININE 1.66*  --  1.22*    Estimated Creatinine Clearance: 27.5 mL/min (A) (by C-G formula based on SCr of 1.22 mg/dL (H)).   Medical History: Past Medical History:  Diagnosis Date  . Acute respiratory failure with hypoxia ([place])   . AKI (acute kidney injury) ([place])   . Cancer ([place])   . Chronic anticoagulation 12/13/2012   Followed by Dr. Chapman Fitch  . Dilated aortic root ([place]) 12/13/2012   4.5 cm 2013.   Marland Kitchen HCAP (healthcare-associated pneumonia)   . Hypertension   . Lung nodule seen on imaging study   . Mild aortic stenosis 12/13/2012  . TIA (transient ischemic attack)     Assessment: Patient 56 yoF presenting with shortness of breath. Pharmacy has been consulted for warfarin dosing. PTA patient was taking warfarin 3 mg PO QD for atrial fibrillation. Last dose  12/15/17 PTA.  INR  1.65,  Trending up,  Still subtherapeutic . HgB 11.5 and PLT low at 84  (ranged 93-134 over past month).  No bleeding reported.   Goal of Therapy:  INR 2-3  Plan:  Warfarin 4 mg PO x1 this evening Monitor daily INR and s/sx of bleeding   Nicole Cella, RPh Clinical Pharmacist Please check AMION for all [place] phone numbers After 10:00 PM, call [place] 12/16/2017 4:44 PM

## 2017-12-16 NOTE — Evaluation (Signed)
Clinical/Bedside Swallow Evaluation Patient Details  Name: Julie Mosley MRN: [id number] Date of Birth: 1926-02-26  Today's Date: 12/16/2017 Time: SLP Start Time (ACUTE ONLY): 0955 SLP Stop Time (ACUTE ONLY): 1010 SLP Time Calculation (min) (ACUTE ONLY): 15 min  Past Medical History:  Past Medical History:  Diagnosis Date  . Acute respiratory failure with hypoxia ([place])   . AKI (acute kidney injury) ([place])   . Cancer ([place])   . Chronic anticoagulation 12/13/2012   Followed by Dr. Chapman Fitch  . Dilated aortic root ([place]) 12/13/2012   4.5 cm 2013.   Marland Kitchen HCAP (healthcare-associated pneumonia)   . Hypertension   . Lung nodule seen on imaging study   . Mild aortic stenosis 12/13/2012  . TIA (transient ischemic attack)    Past Surgical History:  Past Surgical History:  Procedure Laterality Date  . BREAST LUMPECTOMY     bilateral  . CESAREAN SECTION    . HIP SURGERY    . SKIN BIOPSY     From left foot.   HPI:  84 y.o. female who presented to the [facility] on 12/15/2017 with persistent PNA. The patient was recently admitted 10/31-11/5 and treated for HCAP, D/Cd to a rehab facility; returned home but developed progressive SOB, fatigue. PMH includes A-fib on Coumadin, TIA, aortic stenosis, LBBB and melanoma surgically removed.   Assessment / Plan / Recommendation Clinical Impression  Pt presents with functional oropharyngeal swallow with adequate mastication, appearance of brisk swallow response, no overt s/s of aspiration despite large, successive boluses of thin liquid.  Pt reports occasional globus (infrequent, once every two weeks), no heartburn or complaints of esophageal deficit.  There appears to be adequate reciprocity between swallowing and respiratory cycles.  Recommend continuing regular diet, thin liquids; no overt concerns for dysphagia.  SLP service will sign off.  SLP Visit Diagnosis: Dysphagia, unspecified (R13.10)    Aspiration Risk  No limitations    Diet Recommendation   regular  solids, thin liquids  Medication Administration: Whole meds with liquid    Other  Recommendations     Follow up Recommendations None      Frequency and Duration            Prognosis        Swallow Study   General HPI: 84 y.o. female who presented to the [facility] on 12/15/2017 with persistent PNA. The patient was recently admitted 10/31-11/5 and treated for HCAP, D/Cd to a rehab facility; returned home but developed progressive SOB, fatigue. PMH includes A-fib on Coumadin, TIA, aortic stenosis, LBBB and melanoma surgically removed. Type of Study: Bedside Swallow Evaluation Previous Swallow Assessment: no Diet Prior to this Study: Regular;Thin liquids Temperature Spikes Noted: No Respiratory Status: Nasal cannula History of Recent Intubation: No Behavior/Cognition: Alert Oral Cavity Assessment: Within Functional Limits Oral Care Completed by SLP: No Oral Cavity - Dentition: Dentures, top;Dentures, bottom Vision: Functional for self-feeding Self-Feeding Abilities: Able to feed self Patient Positioning: Upright in chair Baseline Vocal Quality: Normal Volitional Cough: Strong Volitional Swallow: Able to elicit    Oral/Motor/Sensory Function Overall Oral Motor/Sensory Function: Within functional limits   Ice Chips Ice chips: Within functional limits   Thin Liquid Thin Liquid: Within functional limits    Nectar Thick Nectar Thick Liquid: Not tested   Honey Thick Honey Thick Liquid: Not tested   Puree Puree: Within functional limits   Solid     Solid: Within functional limits      Juan Quam Laurice 12/16/2017,10:12 AM   Estill Bamberg L. Tivis Ringer, [place]  CCC/SLP Acute Rehabilitation Services Office number [phone] Pager [phone]

## 2017-12-16 NOTE — Care Management Obs Status (Signed)
[facility] NOTIFICATION   Patient Details  Name: Julie Mosley MRN: [id number] Date of Birth: May 23, 1926   Medicare Observation Status Notification Given:  Yes    Carles Collet, RN 12/16/2017, 4:04 PM

## 2017-12-17 DIAGNOSIS — J9601 Acute respiratory failure with hypoxia: Principal | ICD-10-CM

## 2017-12-17 LAB — BASIC METABOLIC PANEL
Anion gap: 10 (ref 5–15)
BUN: 22 mg/dL (ref 8–23)
CHLORIDE: 105 mmol/L (ref 98–111)
CO2: 22 mmol/L (ref 22–32)
CREATININE: 1.26 mg/dL — AB (ref 0.44–1.00)
Calcium: 8 mg/dL — ABNORMAL LOW (ref 8.9–10.3)
GFR calc Af Amer: 42 mL/min — ABNORMAL LOW (ref >60)
GFR calc non Af Amer: 37 mL/min — ABNORMAL LOW (ref >60)
Glucose, Bld: 142 mg/dL — ABNORMAL HIGH (ref 70–99)
Potassium: 4.8 mmol/L (ref 3.5–5.1)
SODIUM: 137 mmol/L (ref 135–145)

## 2017-12-17 LAB — RESPIRATORY PANEL BY PCR
ADENOVIRUS-RVPPCR: NOT DETECTED
BORDETELLA PERTUSSIS-RVPCR: NOT DETECTED
CHLAMYDOPHILA PNEUMONIAE-RVPPCR: NOT DETECTED
Coronavirus 229E: NOT DETECTED
Coronavirus HKU1: NOT DETECTED
Coronavirus NL63: NOT DETECTED
Coronavirus OC43: NOT DETECTED
INFLUENZA A-RVPPCR: NOT DETECTED
INFLUENZA B-RVPPCR: NOT DETECTED
Metapneumovirus: NOT DETECTED
Mycoplasma pneumoniae: NOT DETECTED
PARAINFLUENZA VIRUS 1-RVPPCR: DETECTED — AB
PARAINFLUENZA VIRUS 3-RVPPCR: NOT DETECTED
PARAINFLUENZA VIRUS 4-RVPPCR: NOT DETECTED
Parainfluenza Virus 2: NOT DETECTED
RHINOVIRUS / ENTEROVIRUS - RVPPCR: NOT DETECTED
Respiratory Syncytial Virus: NOT DETECTED

## 2017-12-17 LAB — CBC
HCT: 46.2 % — ABNORMAL HIGH (ref 36.0–46.0)
HEMOGLOBIN: 14.2 g/dL (ref 12.0–15.0)
MCH: 30.7 pg (ref 26.0–34.0)
MCHC: 30.7 g/dL (ref 30.0–36.0)
MCV: 99.8 fL (ref 80.0–100.0)
Platelets: 94 10*3/uL — ABNORMAL LOW (ref 150–400)
RBC: 4.63 MIL/uL (ref 3.87–5.11)
RDW: 15.8 % — ABNORMAL HIGH (ref 11.5–15.5)
WBC: 3.1 10*3/uL — ABNORMAL LOW (ref 4.0–10.5)
nRBC: 0 % (ref 0.0–0.2)

## 2017-12-17 LAB — PROTIME-INR
INR: 2.06
PROTHROMBIN TIME: 23 s — AB (ref 11.4–15.2)

## 2017-12-17 MED ORDER — WARFARIN SODIUM 2 MG PO TABS
2.0000 mg | ORAL_TABLET | Freq: Once | ORAL | Status: AC
  Administered 2017-12-17: 2 mg via ORAL
  Filled 2017-12-17: qty 1

## 2017-12-17 NOTE — Consult Note (Signed)
[facility] Nurse wound consult note Reason for Consult: full thickness wound on left lateral malleolus Wound type: Venous insufficiency Pressure Injury POA: N/A Measurement: 1.5cm x 2cm x 0.4cm with 60% yellow wound bed, 40% red. Wound bed:See above Drainage (amount, consistency, odor) moderate amount of light yellow exudate consistent with autolytically debriding yellow slough Periwound: intact Dressing procedure/placement/frequency: I have provided guidance for topical care to this wound using an antimicrobial absorbent dressing (Aquacel Ag+) that will enhance autolytic debridement. This is to be applied daily.  Post discharge, patient may follow up with her PCP for continuing care of this wound.  [facility] nursing team will not follow, but will remain available to this patient, the nursing and medical teams.  Please re-consult if needed. Thanks, Maudie Flakes, MSN, RN, [place], Arther Abbott  Pager# [phone]

## 2017-12-17 NOTE — Progress Notes (Signed)
.  SLP Cancellation Note  Patient Details Name: LINNA THEBEAU MRN: [id number] DOB: [birth year]   Cancelled treatment:       Reason Eval/Treat Not Completed: Other (comment) Duplicate order received for swallow evaluation. Please refer to SLP evaluation on 12/16/17 for results and recommendations.    Germain Osgood 12/17/2017, 7:32 AM  Germain Osgood, M.A. Automotive engineer [phone] Office [phone]  /

## 2017-12-17 NOTE — Progress Notes (Signed)
Aspiration Precautions  Order Aspiration precautions in EMR Recommend SLP consult  Elevate HOB 30 degrees or higher Sit upright 90 degrees for meals/snacks  Maintain suction set up in room Perform oral care 4 times a day Inspect oral cavity for retained food or medications  Supervise or assist with meals as necessary based on assessment        Independent        Set up assist        Full supervision (feeder) 

## 2017-12-17 NOTE — Consult Note (Signed)
NAME:  Julie Mosley, MRN:  [id number], DOB:  06-02-1926, LOS: 1 ADMISSION DATE:  12/15/2017, CONSULTATION DATE:  11/24 REFERRING MD: Dr Candiss Norse, CHIEF COMPLAINT:  Pulmonary infiltrates/hypoxia   Brief History   84 year old female former smoker , quit 1980 , readmitted 12/15/2017 for progressive pulmonary infiltrates and hypoxia.  PCCM consulted December 17, 2017  History of present illness   84 year old female medical history significant for persistent A. fib on Coumadin, hypertension and history of TIA, moderate severe aortic stenosis, dilated aortic root and left bundle branch block was initially admitted October 31 with presumed community-acquired pneumonia.  CT chest showed new bilateral areas of groundglass opacities and associated air bronchograms.  Prior to initial admission patient had increased productive cough.  And shortness of breath.  She was treated with Rocephin and azithromycin and discharged on doxycycline.  She was discharged to a rehab center due to deconditioning.  Patient says that she was recently discharged to home.  Felt that she was doing okay but started to get weaker.  And more short of breath.  Cough had persisted.  Home health visit revealed patient had O2 saturation at 74% on room air.  She does not carry a diagnosis of COPD or asthma.  Patient went to the emergency room.  Temp was 99.0.  White blood cell count was normal.  Lactate was elevated at 3.99.  Creatinine was elevated at 1.6.  Patient was given 1.5 L saline bolus.  Initial chest x-ray showed improved aeration.  Patient continued to need oxygen related to ongoing desaturations.  CT chest on November 23 showed no enlarged adenopathy.  Lungs showed emphysema changes.  And progressive confluent airspace opacities throughout both lungs right upper and right lower greater than left.  Masslike opacity in the left upper lobe has progressed.  Small bilateral pleural effusions.  5.1 cm ascending thoracic aortic aneurysm..   Patient was initially started on Maxipime and vancomycin but is currently not on any antibiotics.. She remains afebrile and white blood cell count is normal.  Lactic acid was elevated on admission.  This has trended down. Patient says she has a productive cough but is hard to get up any mucus.  She has no fever.  Appetite is decreased.  Patient has lost 20 pounds over the last couple months.  Gets short of breath with activity.  She is currently on oxygen at 4 L with O2 saturation at 93%. She is very independent , drives and lives alone.  She was placed empiric steroids for possible inflammatory component such as BOOP/COP.  CT chest done December 23, 2016 showed right lower lobe pleural-based nodule measuring 0.8 x 0.5 cm.  Nodular density right lower lobe 0.6 cm.,  Left upper lobe pleural-based density.  Says she did have a skin cancer removed along her left foot couple months ago.  Area got secondarily infected.  Her family was told this was local and not a melanoma.    Past Medical History  A Fib on coumadin , HTN , TIA , Moderate to Severe Aortic Stenosis   Significant Hospital Events     Consults:  11/24 PCCM   Procedures:    Significant Diagnostic Tests:    Micro Data:  BC 11/22 >>  Sputum CX 11/23   Antimicrobials:  Maxipime 11/22>11/23  Vanc 11/22 .> 11/23   Interim history/subjective:  + cough , + dyspnea.   Objective   Blood pressure 117/76, pulse 84, temperature 97.9 F (36.6 C), resp. rate 20, height 5'  7" (1.702 m), weight 55.7 kg, SpO2 93 %.        Intake/Output Summary (Last 24 hours) at 12/17/2017 1400 Last data filed at 12/17/2017 1130 Gross per 24 hour  Intake 660 ml  Output -  Net 660 ml   Filed Weights   12/15/17 1615 12/15/17 1951 12/16/17 1639  Weight: 60 kg 54 kg 55.7 kg    Examination: General: Frail elderly female in no acute distress on oxygen HENT: [place]/AT, dry oral mucosa, no JVD Lungs: Scattered rhonchi bilaterally Cardiovascular:  Irregular, grade 2/6 systolic murmur Abdomen: Soft, nontender, positive bowel sounds Extremities: Trace edema Neuro: Intact, alert and oriented x3   Resolved Hospital Problem list     Assessment & Plan:  Right greater than left bilateral infiltrates with associated hypoxia.  Initially presumed community-acquired pneumonia however progressively worsened on CT scan despite adequate course of antibiotics. Differential diagnosis with atypical pneumonia , inflammatory process such as BOOP/COP, CTD/Autoimmune , or malignancy .   -Will need to follow serially for clearance  -Will need FOB w/ BAL/cytology /TTBx -consider for tomorrow if not improving  - Follow culture data -Sputum ,( BAL if done )  -Hold on empiric broad spectrum antibiotics . -check PCT , RVP  -Autoimmune/CTD labs.  -O2 to keep sats >90%.  -Pulmonary Hygiene regimen -cont albuterol nebs , IS  -Continue empiric steroids for now .  -Check urine strep and legionella (neg first admission )  -    Best practice:  Diet: Heart healthy Pain/Anxiety/Delirium protocol (if indicated): N/A VAP protocol (if indicated): N/A DVT prophylaxis: Coumadin GI prophylaxis: N/A Glucose control: N/A Mobility: Bedrest with assistance Code Status: DNR Family Communication: Updated patient and family at bedside Disposition: Per primary team  Labs   CBC: Recent Labs  Lab 12/15/17 1505 12/16/17 0317 12/17/17 0442  WBC 5.2 4.3 3.1*  NEUTROABS 3.9  --   --   HGB 13.5 11.5* 14.2  HCT 43.5 35.7* 46.2*  MCV 101.2* 97.8 99.8  PLT 98* 84* 94*    Basic Metabolic Panel: Recent Labs  Lab 12/15/17 1505 12/16/17 0317 12/17/17 0442  NA 138 136 137  K 3.8 3.3* 4.8  CL 102 105 105  CO2 24 23 22   GLUCOSE 98 76 142*  BUN 31* 27* 22  CREATININE 1.66* 1.22* 1.26*  CALCIUM 8.7* 7.8* 8.0*  MG  --  1.5*  --    GFR: Estimated Creatinine Clearance: 26.6 mL/min (A) (by C-G formula based on SCr of 1.26 mg/dL (H)). Recent Labs  Lab  12/15/17 1505 12/15/17 1518 12/15/17 1812 12/16/17 0317 12/17/17 0442  WBC 5.2  --   --  4.3 3.1*  LATICACIDVEN  --  3.99* 1.25  --   --     Liver Function Tests: Recent Labs  Lab 12/15/17 1505  AST 48*  ALT 21  ALKPHOS 84  BILITOT 0.9  PROT 6.1*  ALBUMIN 3.3*   No results for input(s): LIPASE, AMYLASE in the last 168 hours. No results for input(s): AMMONIA in the last 168 hours.  ABG No results found for: PHART, PCO2ART, PO2ART, HCO3, TCO2, ACIDBASEDEF, O2SAT   Coagulation Profile: Recent Labs  Lab 12/15/17 1707 12/16/17 0317 12/17/17 0442  INR 1.58 1.65 2.06    Cardiac Enzymes: No results for input(s): CKTOTAL, CKMB, CKMBINDEX, TROPONINI in the last 168 hours.  HbA1C: Hgb A1c MFr Bld  Date/Time Value Ref Range Status  11/27/2007 06:40 AM   Final   5.6 (NOTE)   The ADA recommends the  following therapeutic goal for glycemic   control related to Hgb A1C measurement:   Goal of Therapy:   < 7.0% Hgb A1C   Reference: American Diabetes Association: Clinical Practice   Recommendations 2008, Diabetes Care,  2008, 31:(Suppl 1).    CBG: No results for input(s): GLUCAP in the last 168 hours.  Review of Systems:   Constitutional:   No  weight loss, night sweats,  Fevers, chills,  +fatigue, or  lassitude.  HEENT:   No headaches,  Difficulty swallowing,  Tooth/dental problems, or  Sore throat,                No sneezing, itching, ear ache, nasal congestion, post nasal drip,   CV:  No chest pain,  Orthopnea, PND, swelling in lower extremities, anasarca, dizziness, palpitations, syncope.   GI  No heartburn, indigestion, abdominal pain, nausea, vomiting, diarrhea, change in bowel habits, loss of appetite, bloody stools.   Resp:    No chest wall deformity  Skin: no rash or lesions.  GU: no dysuria, change in color of urine, no urgency or frequency.  No flank pain, no hematuria   MS:  No joint pain or swelling.  No decreased range of motion.  No back pain.  Psych:   No change in mood or affect. No depression or anxiety.  No memory loss.       Past Medical History  She,  has a past medical history of Acute respiratory failure with hypoxia ([place]), AKI (acute kidney injury) ([place]), Cancer ([place]), Chronic anticoagulation (12/13/2012), Dilated aortic root ([place]) (12/13/2012), HCAP (healthcare-associated pneumonia), Hypertension, Lung nodule seen on imaging study, Mild aortic stenosis (12/13/2012), and TIA (transient ischemic attack).   Surgical History    Past Surgical History:  Procedure Laterality Date  . BREAST LUMPECTOMY     bilateral  . CESAREAN SECTION    . HIP SURGERY    . SKIN BIOPSY     From left foot.     Social History   reports that she quit smoking about 42 years ago. She has never used smokeless tobacco. She reports that she does not drink alcohol or use drugs.   Family History   Her family history includes Heart attack in her brother, mother, and sister.   Allergies Allergies  Allergen Reactions  . Valium [Diazepam] Other (See Comments)    Stopped breathing     Home Medications  Prior to Admission medications   Medication Sig Start Date End Date Taking? Authorizing Provider  acetaminophen (TYLENOL) 650 MG CR tablet Take 650 mg by mouth every 8 (eight) hours as needed for pain.    [provider]  amLODipine (NORVASC) 5 MG tablet TAKE 1 TABLET BY MOUTH  DAILY Patient taking differently: Take 5 mg by mouth daily.  01/23/17   Jerline Pain, MD  Calcium Carbonate-Vitamin D (CALCIUM 600+D) 600-200 MG-UNIT TABS Take 1 tablet by mouth daily.    [provider]  hydrochlorothiazide (HYDRODIURIL) 25 MG tablet TAKE ONE TABLET BY MOUTH ONCE DAILY Patient taking differently: 25 mg daily.  03/01/16   Jerline Pain, MD  lisinopril (PRINIVIL,ZESTRIL) 20 MG tablet TAKE 1 TABLET BY MOUTH TWO  TIMES DAILY Patient taking differently: Take 20 mg by mouth daily.  10/30/17   Jerline Pain, MD  Misc Natural Products (OSTEO BI-FLEX  JOINT SHIELD PO) Take 1 tablet by mouth daily.     [provider]  Multiple Vitamins-Minerals (CENTRUM SILVER PO) Take 1 tablet by mouth daily.  [provider]  Omega-3 Fatty Acids (FISH OIL) 1000 MG CAPS Take 1 capsule by mouth daily.    [provider]  warfarin (COUMADIN) 3 MG tablet Take 3 mg by mouth daily.     [provider]     Critical care time:    Bentleigh Waren NP-C  [facility]  [phone]   12/17/2017

## 2017-12-17 NOTE — Progress Notes (Signed)
PROGRESS NOTE                                                                                                                                                                                                             Patient Demographics:    Julie Mosley, is a 84 y.o. female, DOB - 04/23/1926, KPV:[id number]  Admit date - 12/15/2017   Admitting Physician Lenore Cordia, MD  Outpatient Primary MD for the patient is Via, Lennette Bihari, MD  LOS - 1  Chief Complaint  Patient presents with  . Pneumonia  . Low O2 sats  . Shortness of Breath       Brief Narrative  Julie Mosley is a 84 y.o. female with medical history significant for persistent atrial fibrillation on Coumadin, hypertension, history of TIA, moderate-severe aortic stenosis, dilated aortic root, and LBBB who presents with shortness of breath.  Patient was recently admitted from 11/23/2017-11/28/2017 for pneumonia and treated with ceftriaxone/azithromycin and discharged on doxycycline.  She was discharged to a rehab facility and did well until returning home about 3 or 4 days ago.  She then developed progressive shortness of breath with dry cough and generalized fatigue.  Home health therapy checked her oxygen saturation today which was reportedly 74%.  She was therefore brought to the emergency department.  She denies any subjective fevers, diaphoresis, chest pain, abdominal pain, dysuria.    Subjective:    Julie Mosley today has, No headache, No chest pain, No abdominal pain - No Nausea, No new weakness tingling or numbness, No Cough - but +ve SOB.     Assessment  & Plan :     1.  Right-sided pulmonary infiltrates recurrent and progressive with hypoxia.  Not convinced this is truly typical pneumonia could be an inflammatory process like cough versus Mycobacterium atypical infection.  At this time will hold all antibiotics, so far blood cultures and sputum cultures are negative.  She has been on IV steroids which for now I  would continue, will request pulmonary to evaluate and guide [place] further.  Of note she does have reported 20 pound unintentional weight loss as well.  2.  Chronic atrial fibrillation.  Mali vas 2 score of at least 3.  Currently on Coumadin which will be continued, not on rate controlling agents.  3.  GERD.  On PPI.  4.  Essential hypertension.  Currently off of Norvasc, ACE inhibitor and HCTZ combination which he takes at home.  Monitor and adjust as needed.  Family Communication  :  None present  Code Status :  DNR  Disposition Plan  :  TBD  Consults  :  Pulmonary  Procedures  :    CT -   1. Marked progression of pneumonia involving both lungs since the CT 3 weeks ago. The most confluent disease is in the RIGHT UPPER LOBE and RIGHT LOWER LOBE. 2. New small BILATERAL pleural effusions. 3. Marked cardiomegaly. Severe aortic valvular calcification. Mild three-vessel coronary atherosclerosis. 4. Ascending thoracic aortic aneurysm measuring up to 5.1 cm as noted previously. Recommendations for follow-up were given on the prior examination three weeks ago and include: Semi-annual imaging followup by CTA or MRA and referral to cardiothoracic surgery if not already obtained. 5. Small amount of perihepatic ascites. Aortic Atherosclerosis (ICD10-I70.0) and Emphysema (ICD10-J43.9).    DVT Prophylaxis  :  Coumadin  Lab Results  Component Value Date   INR 2.06 12/17/2017   INR 1.65 12/16/2017   INR 1.58 12/15/2017     Lab Results  Component Value Date   PLT 94 (L) 12/17/2017    Diet :  Diet Order            Diet Heart Room service appropriate? Yes; Fluid consistency: Thin  Diet effective now               Inpatient Medications Scheduled Meds: . albuterol  2.5 mg Nebulization TID  . guaiFENesin  600 mg Oral BID  . methylPREDNISolone (SOLU-MEDROL) injection  40 mg Intravenous Q8H  . pantoprazole  40 mg Oral BID  . Warfarin - Pharmacist Dosing Inpatient   Does not apply  q1800   Continuous Infusions: PRN Meds:.acetaminophen  Antibiotics  :   Anti-infectives (From admission, onward)   Start     Dose/Rate Route Frequency Ordered Stop   12/17/17 1830  vancomycin (VANCOCIN) IVPB 1000 mg/200 mL premix  Status:  Discontinued     1,000 mg 200 mL/hr over 60 Minutes Intravenous Every 48 hours 12/15/17 1650 12/16/17 1653   12/16/17 2200  vancomycin (VANCOCIN) 500 mg in sodium chloride 0.9 % 100 mL IVPB  Status:  Discontinued     500 mg 100 mL/hr over 60 Minutes Intravenous Every 24 hours 12/16/17 1651 12/17/17 1013   12/16/17 1800  ceFEPIme (MAXIPIME) 2 g in sodium chloride 0.9 % 100 mL IVPB  Status:  Discontinued     2 g 200 mL/hr over 30 Minutes Intravenous Every 24 hours 12/15/17 1650 12/17/17 1013   12/15/17 1700  vancomycin (VANCOCIN) IVPB 1000 mg/200 mL premix     1,000 mg 200 mL/hr over 60 Minutes Intravenous  Once 12/15/17 1649 12/15/17 2154   12/15/17 1700  ceFEPIme (MAXIPIME) 2 g in sodium chloride 0.9 % 100 mL IVPB     2 g 200 mL/hr over 30 Minutes Intravenous  Once 12/15/17 1649 12/15/17 2033          Objective:   Vitals:   12/16/17 1639 12/16/17 2141 12/17/17 0520 12/17/17 0823  BP: 124/73 119/79 117/76   Pulse: 81 69 81 84  Resp: 18 16 16 20   Temp: 98.3 F (36.8 C)  97.9 F (36.6 C)   TempSrc: Oral     SpO2: 91% 93% 94% 93%  Weight: 55.7 kg     Height: 5\' 7"  (1.702 m)       Wt Readings from Last 3 Encounters:  12/16/17 55.7 kg  11/24/17 59.9 kg  06/12/17 59.9 kg     Intake/Output Summary (Last 24 hours)  at 12/17/2017 1023 Last data filed at 12/17/2017 0911 Gross per 24 hour  Intake 460 ml  Output -  Net 460 ml     Physical Exam  Awake Alert, Oriented X 3, No new F.N deficits, Normal affect Julie Mosley,Julie Mosley Supple Neck,No JVD, No cervical lymphadenopathy appriciated.  Symmetrical Chest wall movement, Good air movement bilaterally, few coarse B sounds R>>L RRR,No Gallops,Rubs or new Murmurs, No Parasternal Heave +ve  B.Sounds, Abd Soft, No tenderness, No organomegaly appriciated, No rebound - guarding or rigidity. No Cyanosis, Clubbing or edema, No new Rash or bruise      Data Review:    CBC Recent Labs  Lab 12/15/17 1505 12/16/17 0317 12/17/17 0442  WBC 5.2 4.3 3.1*  HGB 13.5 11.5* 14.2  HCT 43.5 35.7* 46.2*  PLT 98* 84* 94*  MCV 101.2* 97.8 99.8  MCH 31.4 31.5 30.7  MCHC 31.0 32.2 30.7  RDW 16.0* 15.8* 15.8*  LYMPHSABS 0.7  --   --   MONOABS 0.4  --   --   EOSABS 0.0  --   --   BASOSABS 0.0  --   --     Chemistries  Recent Labs  Lab 12/15/17 1505 12/16/17 0317 12/17/17 0442  NA 138 136 137  K 3.8 3.3* 4.8  CL 102 105 105  CO2 24 23 22   GLUCOSE 98 76 142*  BUN 31* 27* 22  CREATININE 1.66* 1.22* 1.26*  CALCIUM 8.7* 7.8* 8.0*  MG  --  1.5*  --   AST 48*  --   --   ALT 21  --   --   ALKPHOS 84  --   --   BILITOT 0.9  --   --    ------------------------------------------------------------------------------------------------------------------ No results for input(s): CHOL, HDL, LDLCALC, TRIG, CHOLHDL, LDLDIRECT in the last 72 hours.  Lab Results  Component Value Date   HGBA1C  11/27/2007    5.6 (NOTE)   The ADA recommends the following therapeutic goal for glycemic   control related to Hgb A1C measurement:   Goal of Therapy:   < 7.0% Hgb A1C   Reference: American Diabetes Association: Clinical Practice   Recommendations 2008, Diabetes Care,  2008, 31:(Suppl 1).   ------------------------------------------------------------------------------------------------------------------ No results for input(s): TSH, T4TOTAL, T3FREE, THYROIDAB in the last 72 hours.  Invalid input(s): FREET3 ------------------------------------------------------------------------------------------------------------------ No results for input(s): VITAMINB12, FOLATE, FERRITIN, TIBC, IRON, RETICCTPCT in the last 72 hours.  Coagulation profile Recent Labs  Lab 12/15/17 1707 12/16/17 0317  12/17/17 0442  INR 1.58 1.65 2.06    No results for input(s): DDIMER in the last 72 hours.  Cardiac Enzymes No results for input(s): CKMB, TROPONINI, MYOGLOBIN in the last 168 hours.  Invalid input(s): CK ------------------------------------------------------------------------------------------------------------------ No results found for: BNP  Micro Results Recent Results (from the past 240 hour(s))  Blood culture (routine x 2)     Status: None (Preliminary result)   Collection Time: 12/15/17  7:21 PM  Result Value Ref Range Status   Specimen Description BLOOD LEFT ANTECUBITAL  Final   Special Requests   Final    BOTTLES DRAWN AEROBIC AND ANAEROBIC Blood Culture adequate volume   Culture   Final    NO GROWTH < 24 HOURS Performed at [facility], [address], [place], [place] [address]    Report Status PENDING  Incomplete  Blood culture (routine x 2)     Status: None (Preliminary result)   Collection Time: 12/15/17  8:48 PM  Result Value Ref Range Status  Specimen Description BLOOD RIGHT ANTECUBITAL  Final   Special Requests   Final    BOTTLES DRAWN AEROBIC ONLY Blood Culture adequate volume   Culture   Final    NO GROWTH < 24 HOURS Performed at [facility], [address], [place], [place] [address]    Report Status PENDING  Incomplete  Expectorated sputum assessment w rflx to resp cult     Status: None   Collection Time: 12/16/17  2:08 PM  Result Value Ref Range Status   Specimen Description EXPECTORATED SPUTUM  Final   Special Requests NONE  Final   Sputum evaluation   Final    Sputum specimen not acceptable for testing.  Please recollect.   RESULT CALLED TO, READ BACK BY AND VERIFIED WITH: RN Ranelle Oyster [id number] [phone] FCP Performed at [facility], [place] [address] [place], [place] [address]    Report Status 12/16/2017 FINAL  Final    Radiology Reports Dg Chest 2 View  Result Date: 12/15/2017 CLINICAL DATA:  84 year old female with  ongoing shortness of breath, weakness and dry cough for 1 week. Recently diagnosed with pneumonia. EXAM: CHEST - 2 VIEW COMPARISON:  Chest x-ray 11/23/2017.  Chest CT 11/24/2017. FINDINGS: When compared to the prior chest x-ray from 11/23/2017, there continues to be widespread but patchy peripheral predominant areas of airspace consolidation and ground-glass opacities with some associated interstitial prominence. These appear slightly less confluent than the prior examination, particularly in the region of the right lower lobe, however, the overall distribution is very similar to the prior examination. Trace right pleural effusion. No definite left pleural effusion. No pneumothorax. No evidence of pulmonary edema. Heart size is normal. Upper mediastinal contours are within normal limits. Aortic atherosclerosis. IMPRESSION: 1. Minimal improvement in aeration compared to prior study from 11/23/2017. Findings are favored to reflect a resolving pneumonia, potentially with post inflammatory process such as evolving cryptogenic organizing pneumonia (COP). Based on the appearance of the prior chest CT, the possibility of multifocal neoplasm such as adenocarcinoma could also be considered, but is not strongly favored. As previously suggested, repeat chest CT (preferably high-resolution chest CT) is recommended in early February 2020 to reassess these findings. 2. Aortic atherosclerosis. Electronically Signed   By: Vinnie Langton M.D.   On: 12/15/2017 17:06   Dg Chest 2 View  Result Date: 11/23/2017 CLINICAL DATA:  Pneumonia.  Cough and shortness of breath. EXAM: CHEST - 2 VIEW COMPARISON:  November 16, 2017 FINDINGS: Patchy infiltrates in the lungs, right greater than left, similar on the right in the interval and mildly more pronounced on the left. Cardiomegaly. The hila and mediastinum are normal. No pneumothorax. No pulmonary nodules or masses. IMPRESSION: Persistent bilateral pulmonary infiltrates, similar on the  right in the interval at and mildly more prominent on the left. Recommend treatment with short-term follow-up to ensure resolution. Electronically Signed   By: Dorise Bullion III M.D   On: 11/23/2017 15:43   Ct Chest Wo Contrast  Result Date: 12/16/2017 CLINICAL DATA:  Follow-up pneumonia. EXAM: CT CHEST WITHOUT CONTRAST TECHNIQUE: Multidetector CT imaging of the chest was performed following the standard protocol without IV contrast. COMPARISON:  CT chest 11/24/2017, 12/23/2016. Multiple prior chest x-rays, most recently yesterday. FINDINGS: Cardiovascular: Marked cardiomegaly. Severe aortic valvular calcification. Mild three-vessel coronary atherosclerosis. Approximate 5.1 cm ascending thoracic aortic aneurysm as noted on the prior examinations. Severe atherosclerosis involving the thoracic and proximal abdominal aorta. Enlarged pulmonary arteries, the RIGHT main pulmonary artery measuring up  to 3.2 cm and the LEFT main pulmonary artery measuring up to 3.5 cm. Mediastinum/Nodes: No pathologically enlarged mediastinal, hilar or axillary lymph nodes. No mediastinal masses. Normal-appearing esophagus. Atrophic thyroid gland containing subcentimeter nodules. Lungs/Pleura: Emphysematous changes throughout both lungs. Since the examination 3 weeks ago, marked progression of the now confluent airspace opacities throughout both lungs, with greatest involvement in the [place] and RIGHT LOWER LOBE. The masslike opacity in the LEFT UPPER LOBE has also progressed in the interval. Small BILATERAL pleural effusions are present. Upper Abdomen: Small amount of perihepatic ascites. Visualized upper abdomen otherwise unremarkable for the unenhanced technique. Musculoskeletal: Exaggeration of the usual thoracic kyphosis. Diffuse degenerative disc disease and spondylosis throughout the thoracic spine. IMPRESSION: 1. Marked progression of pneumonia involving both lungs since the CT 3 weeks ago. The most confluent  disease is in the RIGHT UPPER LOBE and RIGHT LOWER LOBE. 2. New small BILATERAL pleural effusions. 3. Marked cardiomegaly. Severe aortic valvular calcification. Mild three-vessel coronary atherosclerosis. 4. Ascending thoracic aortic aneurysm measuring up to 5.1 cm as noted previously. Recommendations for follow-up were given on the prior examination three weeks ago and include: Semi-annual imaging followup by CTA or MRA and referral to cardiothoracic surgery if not already obtained. This recommendation follows 2010 ACCF/AHA/AATS/ACR/ASA/SCA/SCAI/SIR/STS/SVM Guidelines for the Diagnosis and Management of Patients With Thoracic Aortic Disease. Circulation. 2010; 121: G644-I347. 5. Small amount of perihepatic ascites. Aortic Atherosclerosis (ICD10-I70.0) and Emphysema (ICD10-J43.9). Electronically Signed   By: Evangeline Dakin M.D.   On: 12/16/2017 19:54   Ct Chest Wo Contrast  Result Date: 11/24/2017 CLINICAL DATA:  Follow-up lung nodule.  No chest complaints. EXAM: CT CHEST WITHOUT CONTRAST TECHNIQUE: Multidetector CT imaging of the chest was performed following the standard protocol without IV contrast. COMPARISON:  12/23/2016 FINDINGS: Cardiovascular: The heart is enlarged. Coronary artery calcifications are present. There is calcification dilatation of the aortic root, measuring 5.1 centimeters (previously 4.5 centimeters. Ascending aorta is 5.1 centimeters, previously 5.0 centimeters. Aortic arch is 3.2 centimeters. Previously 3.1 centimeters. Distal arch is 3.2 centimeters, previously 3.0 centimeters. Descending thoracic aorta is 3.0 centimeters, stable. At the level of the diaphragmatic hiatus, the aorta is 2.8 centimeters, previously 2.6 centimeters. No evidence for displaced intimal calcifications. Mediastinum/Nodes: There is high attenuation material within the contrast, consistent reflux or poor peristalsis. No obstructing mass is identified. No mediastinal, hilar, or axillary adenopathy. Lungs/Pleura:  There are numerous ground-glass opacities throughout the lungs bilaterally. These areas are discrete in the UPPER lobes and confluent in the LOWER lobes. These ground-glass opacities obscure the nodules previously described and is difficult to assess for new pulmonary nodules given the presence of significant lung opacities which are likely inflammatory/infectious. No pleural effusion. Upper Abdomen: Stable appearance of low-attenuation lesion within the RIGHT kidney. Musculoskeletal: Midthoracic spondylosis. IMPRESSION: 1. Ascending aortic aneurysm. Ascending aorta now measures 5.1 centimeters, previously 5.0 centimeters. Aortic root is 5.1 centimeters, previously 4.5 centimeters. Ascending thoracic aortic aneurysm. Recommend semi-annual imaging followup by CTA or MRA and referral to cardiothoracic surgery if not already obtained. This recommendation follows 2010 ACCF/AHA/AATS/ACR/ASA/SCA/SCAI/SIR/STS/SVM Guidelines for the Diagnosis and Management of Patients With Thoracic Aortic Disease. Circulation. 2010; 121: Q259-D638 2. New bilateral areas of ground-glass opacities and associated air bronchograms throughout the lungs bilaterally, confluent at the bases. Findings are consistent with infectious process. Follow-up CT of the chest is recommended in 3 months. 3. Debris within the esophagus consistent with poor peristalsis/obstruction, or reflux. 4. Cyst within the UPPER pole of the RIGHT kidney, stable in appearance. Electronically  Signed   By: Nolon Nations M.D.   On: 11/24/2017 09:33    Time Spent in minutes  30   Lala Lund M.D on 12/17/2017 at 10:23 AM  To page go to [email or web address] - password [facility]

## 2017-12-17 NOTE — Progress Notes (Signed)
ANTICOAGULATION CONSULT NOTE - follow up Pharmacy Consult for warfarin Indication: atrial fibrillation  Allergies  Allergen Reactions  . Valium [Diazepam] Other (See Comments)    Stopped breathing    Patient Measurements: Height: 5\' 7"  (170.2 cm) Weight: 122 lb 12.7 oz (55.7 kg) IBW/kg (Calculated) : 61.6  Vital Signs: Temp: 97.9 F (36.6 C) (11/24 0520) BP: 117/76 (11/24 0520) Pulse Rate: 84 (11/24 0823)  Labs: Recent Labs    12/15/17 1505 12/15/17 1707 12/16/17 0317 12/17/17 0442  HGB 13.5  --  11.5* 14.2  HCT 43.5  --  35.7* 46.2*  PLT 98*  --  84* 94*  LABPROT  --  18.7* 19.3* 23.0*  INR  --  1.58 1.65 2.06  CREATININE 1.66*  --  1.22* 1.26*    Estimated Creatinine Clearance: 26.6 mL/min (A) (by C-G formula based on SCr of 1.26 mg/dL (H)).   Medical History: Past Medical History:  Diagnosis Date  . Acute respiratory failure with hypoxia ([place])   . AKI (acute kidney injury) ([place])   . Cancer ([place])   . Chronic anticoagulation 12/13/2012   Followed by Dr. Chapman Fitch  . Dilated aortic root ([place]) 12/13/2012   4.5 cm 2013.   Marland Kitchen HCAP (healthcare-associated pneumonia)   . Hypertension   . Lung nodule seen on imaging study   . Mild aortic stenosis 12/13/2012  . TIA (transient ischemic attack)     Assessment: Patient 68 yoF presenting with shortness of breath. Pharmacy has been consulted for warfarin dosing. PTA patient was taking warfarin 3 mg PO QD for atrial fibrillation. Last dose  12/15/17 PTA.  INR subtherapeutic on admit 12/15/17.   INR  2.06, up from 1.65 yesterday. Therapeutic. HgB improved  and PLT low stable.  (ranged 93-134 over past month).  No bleeding reported.   Goal of Therapy:  INR 2-3  Plan:  Warfarin 2 mg PO x1 this evening Monitor daily INR and s/sx of bleeding   Nicole Cella, RPh Clinical Pharmacist Please check AMION for all [place] phone numbers After 10:00 PM, call [place] 12/17/2017 10:31 AM

## 2017-12-17 NOTE — Plan of Care (Signed)

## 2017-12-18 ENCOUNTER — Inpatient Hospital Stay (HOSPITAL_COMMUNITY): Payer: Medicare Other

## 2017-12-18 DIAGNOSIS — R918 Other nonspecific abnormal finding of lung field: Secondary | ICD-10-CM

## 2017-12-18 DIAGNOSIS — J849 Interstitial pulmonary disease, unspecified: Secondary | ICD-10-CM

## 2017-12-18 DIAGNOSIS — R0902 Hypoxemia: Secondary | ICD-10-CM

## 2017-12-18 LAB — CBC
HCT: 42.3 % (ref 36.0–46.0)
Hemoglobin: 13.4 g/dL (ref 12.0–15.0)
MCH: 31.2 pg (ref 26.0–34.0)
MCHC: 31.7 g/dL (ref 30.0–36.0)
MCV: 98.4 fL (ref 80.0–100.0)
NRBC: 0 % (ref 0.0–0.2)
PLATELETS: 100 10*3/uL — AB (ref 150–400)
RBC: 4.3 MIL/uL (ref 3.87–5.11)
RDW: 15.8 % — ABNORMAL HIGH (ref 11.5–15.5)
WBC: 5.9 10*3/uL (ref 4.0–10.5)

## 2017-12-18 LAB — CK TOTAL AND CKMB (NOT AT ARMC)
CK TOTAL: 27 U/L — AB (ref 38–234)
CK, MB: 3.4 ng/mL (ref 0.5–5.0)
RELATIVE INDEX: INVALID (ref 0.0–2.5)

## 2017-12-18 LAB — PROTIME-INR
INR: 3.9
PROTHROMBIN TIME: 37.6 s — AB (ref 11.4–15.2)

## 2017-12-18 LAB — SEDIMENTATION RATE: Sed Rate: 2 mm/hr (ref 0–22)

## 2017-12-18 LAB — STREP PNEUMONIAE URINARY ANTIGEN: Strep Pneumo Urinary Antigen: NEGATIVE

## 2017-12-18 LAB — PROCALCITONIN: Procalcitonin: <0.1 ng/mL

## 2017-12-18 MED ORDER — MAGNESIUM SULFATE 2 GM/50ML IV SOLN
2.0000 g | Freq: Once | INTRAVENOUS | Status: AC
  Administered 2017-12-18: 2 g via INTRAVENOUS
  Filled 2017-12-18: qty 50

## 2017-12-18 MED ORDER — METOCLOPRAMIDE HCL 10 MG/10ML PO SOLN
5.0000 mg | Freq: Three times a day (TID) | ORAL | Status: DC
  Administered 2017-12-18 – 2017-12-19 (×3): 5 mg via ORAL
  Filled 2017-12-18 (×13): qty 5
  Filled 2017-12-18: qty 10
  Filled 2017-12-18: qty 5

## 2017-12-18 NOTE — Progress Notes (Signed)
Occupational Therapy Treatment Patient Details Name: Julie Mosley MRN: [id number] DOB: 04/29/1926 Today's Date: 12/18/2017    History of present illness 84 y.o. female who presented to the [facility] on 12/15/2017 with persistent PNA. The patient was recently admitted 10/31-11/5 and treated for HCAP, D/Cd to a rehab facility; returned home but developed progressive SOB, fatigue. PMH includes A-fib on Coumadin, TIA, aortic stenosis, LBBB and melanoma surgically removed.    OT comments  Pt progressing towards established OT goals. Pt performing grooming tasks at sink with supervision for safety. Pt performing toileting with Min A for transfer. Pt SpO2 fluctuating between 86%-96% during activity; pt denies any SOB or dizziness. At rest, pt SpO2 98-96% on RA. Continue to recommend post-acute rehab and may progress to home with [place] pending her progress and family support. Will continue to follow acutely as admitted.    Follow Up Recommendations  SNF;Supervision/Assistance - 24 hour(May progress to home with HHOT pending progress and support)    Equipment Recommendations  Tub/shower seat;Other (comment)(Defer to next venue)    Recommendations for Other Services PT consult    Precautions / Restrictions Precautions Precautions: Fall Precaution Comments: Watch SpO2, urinary frequency Restrictions Weight Bearing Restrictions: No       Mobility Bed Mobility               General bed mobility comments: Pt in recliner upon arrival  Transfers Overall transfer level: Needs assistance Equipment used: None Transfers: Sit to/from Stand;Stand Pivot Transfers Sit to Stand: Min assist         General transfer comment: Min A to power up into standing from low surface    Balance Overall balance assessment: Needs assistance Sitting-balance support: No upper extremity supported;Feet supported Sitting balance-Leahy Scale: Good     Standing balance support: No upper extremity  supported;During functional activity Standing balance-Leahy Scale: Fair                             ADL either performed or assessed with clinical judgement   ADL Overall ADL's : Needs assistance/impaired     Grooming: Supervision/safety;Set up;Sitting;Wash/dry face;Wash/dry hands;Oral care Grooming Details (indicate cue type and reason): Pt performing hand hygiene, washing her face, and performing oral care at sink with supervision. Pt demosntrating increased activity tolerance to maintain standing and SpO2 flucutating between 88%-92%                 Toilet Transfer: Minimal assistance;Ambulation;Regular Toilet;Grab bars Toilet Transfer Details (indicate cue type and reason): Min A for power up into standing.  Toileting- Clothing Manipulation and Hygiene: Supervision/safety;Set up;Sitting/lateral lean Toileting - Clothing Manipulation Details (indicate cue type and reason): Pt performing peri care     Functional mobility during ADLs: Min guard General ADL Comments: Pt demonstrating increased functional performance and activity tolerance. Pt SpO2 ranging between 86%-96% on RA.      Vision       Perception     Praxis      Cognition Arousal/Alertness: Awake/alert Behavior During Therapy: WFL for tasks assessed/performed Overall Cognitive Status: Within Functional Limits for tasks assessed                                          Exercises     Shoulder Instructions       General Comments Son present throughout. SpO2 ranging between  96%-86% on RA. Pt denies any SOB, dizziness, or fatigue    Pertinent Vitals/ Pain       Pain Assessment: Faces Faces Pain Scale: Hurts a little bit Pain Location: Generalized; with coughing Pain Descriptors / Indicators: Grimacing Pain Intervention(s): Monitored during session;Limited activity within patient's tolerance;Repositioned  Home Living                                           Prior Functioning/Environment              Frequency  Min 2X/week        Progress Toward Goals  OT Goals(current goals can now be found in the care plan section)  Progress towards OT goals: Progressing toward goals  Acute Rehab OT Goals Patient Stated Goal: "Stop coughing and be able to work in my yard." OT Goal Formulation: With patient/family Time For Goal Achievement: 12/30/17 Potential to Achieve Goals: Good  Plan Discharge plan remains appropriate    Co-evaluation                 AM-PAC OT "6 Clicks" Daily Activity     Outcome Measure   Help from another person eating meals?: None Help from another person taking care of personal grooming?: A Little Help from another person toileting, which includes using toliet, bedpan, or urinal?: A Little Help from another person bathing (including washing, rinsing, drying)?: A Little Help from another person to put on and taking off regular upper body clothing?: A Little Help from another person to put on and taking off regular lower body clothing?: A Little 6 Click Score: 19    End of Session Equipment Utilized During Treatment: Gait belt  OT Visit Diagnosis: Unsteadiness on feet (R26.81);Other abnormalities of gait and mobility (R26.89);Muscle weakness (generalized) (M62.81);Pain Pain - part of body: (Generalized)   Activity Tolerance Patient tolerated treatment well   Patient Left in chair;with call bell/phone within reach;with family/visitor present;with chair alarm set   Nurse Communication Mobility status;Other (comment)(SpO2)        Time: 6578-4696 OT Time Calculation (min): 30 min  Charges: OT General Charges $OT Visit: 1 Visit OT Treatments $Self Care/Home Management : 23-37 mins  [place], OTR/L Acute Rehab Pager: [phone] Office: [place] 12/18/2017, 4:50 PM

## 2017-12-18 NOTE — NC FL2 (Signed)
[place] LEVEL OF CARE SCREENING TOOL     IDENTIFICATION  Patient Name: Julie Mosley Birthdate: Jun 30, 1926 Sex: female Admission Date (Current Location): 12/15/2017  [facility] and [place] Number:  Herbalist and Address:  [facility] [place], [place] [address] [place], [place] [address]      Provider Number: [id number]  Attending Physician Name and Address:  Thurnell Lose, MD  Relative Name and Phone Number:  [name], son, [phone]    Current Level of Care: Hospital Recommended Level of Care: [place] Prior Approval Number:    Date Approved/Denied:   PASRR Number: [id number]  Discharge Plan: SNF    Current Diagnoses: Patient Active Problem List   Diagnosis Date Noted  . Acute respiratory failure with hypoxia ([place]) 12/16/2017  . Esophageal reflux 12/16/2017  . Lung nodule seen on imaging study   . Hypoxia 12/15/2017  . Pressure injury of skin 11/24/2017  . Pneumonia 11/23/2017  . Open wound of left ankle 11/23/2017  . Hypokalemia 11/23/2017  . AKI (acute kidney injury) ([place]) 11/23/2017  . Pulmonary nodule less than 6 cm determined by computed tomography of lung 11/23/2017  . Aortic stenosis 12/22/2014  . Persistent atrial fibrillation 12/22/2014  . Postinflammatory pulmonary fibrosis ([place]) 01/15/2014  . Chronic anticoagulation 12/13/2012  . Mild aortic stenosis 12/13/2012  . Dilated aortic root ([place]) 12/13/2012  . A-fib ([place]) 04/14/2012  . History of stroke 04/14/2012  . Essential hypertension, benign 04/14/2012  . Other and unspecified hyperlipidemia 04/14/2012  . Senile osteoporosis 04/14/2012  . Uncontrolled pain 04/03/2012  . Neck pain 04/02/2012  . H/O: CVA (cerebrovascular accident) 04/02/2012  . Nausea 04/02/2012  . Hypertension   . TIA (transient ischemic attack)     Orientation RESPIRATION BLADDER Height & Weight     Time, Situation, Place, Self  O2(Nasal cannula 4L) Continent, External  catheter Weight: 55.7 kg Height:  5\' 7"  (170.2 cm)  BEHAVIORAL SYMPTOMS/MOOD NEUROLOGICAL BOWEL NUTRITION STATUS      Continent Diet(Please see DC Summary)  AMBULATORY STATUS COMMUNICATION OF NEEDS Skin   Limited Assist Verbally PU Stage and Appropriate Care(on ankle)                       Personal Care Assistance Level of Assistance  Bathing, Feeding, Dressing Bathing Assistance: Limited assistance Feeding assistance: Independent Dressing Assistance: Limited assistance     Functional Limitations Info    Sight Info: Adequate Hearing Info: Impaired Speech Info: Adequate    SPECIAL CARE FACTORS FREQUENCY  PT (By licensed PT), OT (By licensed OT)     PT Frequency: 5x/week OT Frequency: 3x/week            Contractures Contractures Info: Not present    Additional Factors Info  Code Status, Allergies, Isolation Precautions Code Status Info: DNR Allergies Info: Allergies:  Valium Diazepam     Isolation Precautions Info: Rule-out respiratory panel     Current Medications (12/18/2017):  This is the current hospital active medication list Current Facility-Administered Medications  Medication Dose Route Frequency Provider Last Rate Last Dose  . acetaminophen (TYLENOL) tablet 650 mg  650 mg Oral Q8H PRN Zada Finders R, MD      . guaiFENesin (MUCINEX) 12 hr tablet 600 mg  600 mg Oral BID Zada Finders R, MD   600 mg at 12/17/17 2148  . methylPREDNISolone sodium succinate (SOLU-MEDROL) 40 mg/mL injection 40 mg  40 mg Intravenous Q8H Black, Lezlie Octave, NP  40 mg at 12/18/17 0537  . pantoprazole (PROTONIX) EC tablet 40 mg  40 mg Oral BID Radene Gunning, NP   40 mg at 12/17/17 2148  . Warfarin - Pharmacist Dosing Inpatient   Does not apply [place], [place]         Discharge Medications: Please see discharge summary for a list of discharge medications.  Relevant Imaging Results:  Relevant Lab Results:   Additional Information SSN;  [id number]  Benard Halsted, LCSW

## 2017-12-18 NOTE — Care Management Note (Addendum)
Case Management Note  Patient Details  Name: SARYA LINENBERGER MRN: [id number] Date of Birth: 10/29/1926  Subjective/Objective:          Acute respiratory failure with hypoxia. From home alone . States PTA received home health services provided per [facility]. Independent with ADL's , no DME PTA.         Darrin Sharol Roussel (Son)/ Gabreille Dardis (Son)/ Tia Alert    [phone]     Dr.Kevin Via  Action/Plan: Transition to home when medically stable.... Pt will need resumption orders from MD for Home health services ( PT/RN, OT ,SLP, NA, SW).  NCM to f/u with TOC needs.  Pt states son to provide transportation to home.  Expected Discharge Date:                  Expected Discharge Plan:  [place]  In-House Referral:     Discharge planning Services     Post Acute Care Choice:  Home Health, Resumption of Svcs/PTA Provider(Pt states active with [facility]) Choice offered to:  Patient  DME Arranged:  N/A DME Agency:  NA  HH Arranged:    [place] Agency:  [facility]  Status of Service:  In process, will continue to follow  If discussed at Long Length of Stay Meetings, dates discussed:    Additional Comments:  Sharin Mons, RN 12/18/2017, 10:20 AM

## 2017-12-18 NOTE — Progress Notes (Signed)
ANTICOAGULATION CONSULT NOTE - Follow Up Consult  Pharmacy Consult for Coumadin Indication: atrial fibrillation  Allergies  Allergen Reactions  . Valium [Diazepam] Other (See Comments)    Stopped breathing    Patient Measurements: Height: 5\' 7"  (170.2 cm) Weight: 122 lb 12.7 oz (55.7 kg) IBW/kg (Calculated) : 61.6  Vital Signs: Temp: 97.8 F (36.6 C) (11/25 0840) Temp Source: Oral (11/25 0840) BP: 131/86 (11/25 0840) Pulse Rate: 76 (11/25 0840)  Labs: Recent Labs    12/15/17 1505  12/16/17 0317 12/17/17 0442 12/18/17 0505  HGB 13.5  --  11.5* 14.2 13.4  HCT 43.5  --  35.7* 46.2* 42.3  PLT 98*  --  84* 94* 100*  LABPROT  --    < > 19.3* 23.0* 37.6*  INR  --    < > 1.65 2.06 3.90  CREATININE 1.66*  --  1.22* 1.26*  --   CKTOTAL  --   --   --   --  27*  CKMB  --   --   --   --  3.4   < > = values in this interval not displayed.    Estimated Creatinine Clearance: 26.6 mL/min (A) (by C-G formula based on SCr of 1.26 mg/dL (H)).  Assessment:  Anticoag: Warfarin PTA for hx Afib.  INR  1.65>2.06>3.9,  HgB 13.4 and PLT low end 100  (ranged 93-134 over past month).  No bleeding reported.   - PTA Warf dose 3 mg/day  Goal of Therapy:  INR 2-3 Monitor platelets by anticoagulation protocol: Yes   Plan:  - Hold Coumadin tonight - Monitor daily INR and s/sx of bleed  Chemeka Filice S. Alford [place]land, PharmD, BCPS Clinical Staff Pharmacist Eilene Ghazi [place] 12/18/2017,10:40 AM

## 2017-12-18 NOTE — Progress Notes (Addendum)
PROGRESS NOTE                                                                                                                                                                                                             Patient Demographics:    Julie Mosley, is a 84 y.o. female, DOB - [birth year], BSW:[id number]  Admit date - 12/15/2017   Admitting Physician Lenore Cordia, MD  Outpatient Primary MD for the patient is Via, Lennette Bihari, MD  LOS - 2  Chief Complaint  Patient presents with  . Pneumonia  . Low O2 sats  . Shortness of Breath       Brief Narrative  Julie Mosley is a 84 y.o. female with medical history significant for persistent atrial fibrillation on Coumadin, hypertension, history of TIA, moderate-severe aortic stenosis, dilated aortic root, and LBBB who presents with shortness of breath.  Patient was recently admitted from 11/23/2017-11/28/2017 for pneumonia and treated with ceftriaxone/azithromycin and discharged on doxycycline.  She was discharged to a rehab facility and did well until returning home about 3 or 4 days ago.  She then developed progressive shortness of breath with dry cough and generalized fatigue.  Home health therapy checked her oxygen saturation today which was reportedly 74%.  She was therefore brought to the emergency department.  She denies any subjective fevers, diaphoresis, chest pain, abdominal pain, dysuria.    Subjective:   Patient in bed, appears comfortable, denies any headache, no fever, no chest pain or pressure, improved shortness of breath , no abdominal pain. No focal weakness.    Assessment  & Plan :     1.  Right-sided pulmonary infiltrates recurrent and progressive with hypoxia.  Not convinced this is truly typical pneumonia could be an inflammatory process like cough versus Mycobacterium atypical infection.  At this time will hold all antibiotics, so far blood cultures and sputum cultures are negative.  Has been cleared by speech  from aspiration standpoint.    She has been seen pulmonary who agreed that this might be an inflammatory process, incidentally she has parainfluenza positive on her viral panel however her symptoms are off and on ongoing for several weeks now, continue IV steroids per pulmonary, continue supportive care with oxygen nebulizer treatments, advance activity, likely discharge in the next 1 to 2 days await input from pulmonary today.   2.  Chronic atrial fibrillation.  Mali vas 2 score of at least 3.  Currently on Coumadin which will be continued, not on  rate controlling agents.  3.  GERD.  On PPI.  4.  Essential hypertension.  Currently off of Norvasc, ACE inhibitor and HCTZ combination which he takes at home.  Monitor and adjust as needed.  5.  Hypomagnesemia.  Replaced.    Family Communication  :  None present  Code Status :  DNR  Disposition Plan  :  HHPT  Consults  :  Pulmonary  Procedures  :    CT -   1. Marked progression of pneumonia involving both lungs since the CT 3 weeks ago. The most confluent disease is in the RIGHT UPPER LOBE and RIGHT LOWER LOBE. 2. New small BILATERAL pleural effusions. 3. Marked cardiomegaly. Severe aortic valvular calcification. Mild three-vessel coronary atherosclerosis. 4. Ascending thoracic aortic aneurysm measuring up to 5.1 cm as noted previously. Recommendations for follow-up were given on the prior examination three weeks ago and include: Semi-annual imaging followup by CTA or MRA and referral to cardiothoracic surgery if not already obtained. 5. Small amount of perihepatic ascites. Aortic Atherosclerosis (ICD10-I70.0) and Emphysema (ICD10-J43.9).    DVT Prophylaxis  :  Coumadin  Lab Results  Component Value Date   INR 3.90 12/18/2017   INR 2.06 12/17/2017   INR 1.65 12/16/2017     Lab Results  Component Value Date   PLT 100 (L) 12/18/2017    Diet :  Diet Order            Diet Heart Room service appropriate? Yes; Fluid consistency:  Thin  Diet effective now               Inpatient Medications Scheduled Meds: . guaiFENesin  600 mg Oral BID  . methylPREDNISolone (SOLU-MEDROL) injection  40 mg Intravenous Q8H  . pantoprazole  40 mg Oral BID  . Warfarin - Pharmacist Dosing Inpatient   Does not apply q1800   Continuous Infusions: PRN Meds:.acetaminophen  Antibiotics  :   Anti-infectives (From admission, onward)   Start     Dose/Rate Route Frequency Ordered Stop   12/17/17 1830  vancomycin (VANCOCIN) IVPB 1000 mg/200 mL premix  Status:  Discontinued     1,000 mg 200 mL/hr over 60 Minutes Intravenous Every 48 hours 12/15/17 1650 12/16/17 1653   12/16/17 2200  vancomycin (VANCOCIN) 500 mg in sodium chloride 0.9 % 100 mL IVPB  Status:  Discontinued     500 mg 100 mL/hr over 60 Minutes Intravenous Every 24 hours 12/16/17 1651 12/17/17 1013   12/16/17 1800  ceFEPIme (MAXIPIME) 2 g in sodium chloride 0.9 % 100 mL IVPB  Status:  Discontinued     2 g 200 mL/hr over 30 Minutes Intravenous Every 24 hours 12/15/17 1650 12/17/17 1013   12/15/17 1700  vancomycin (VANCOCIN) IVPB 1000 mg/200 mL premix     1,000 mg 200 mL/hr over 60 Minutes Intravenous  Once 12/15/17 1649 12/15/17 2154   12/15/17 1700  ceFEPIme (MAXIPIME) 2 g in sodium chloride 0.9 % 100 mL IVPB     2 g 200 mL/hr over 30 Minutes Intravenous  Once 12/15/17 1649 12/15/17 2033          Objective:   Vitals:   12/18/17 0525 12/18/17 0840 12/18/17 1040 12/18/17 1244  BP: 126/70 131/86  110/60  Pulse: (!) 58 76  75  Resp:    15  Temp: 98.3 F (36.8 C) 97.8 F (36.6 C)  98.6 F (37 C)  TempSrc: Oral Oral  Oral  SpO2: 93% 94% 95% 95%  Weight:  Height:        Wt Readings from Last 3 Encounters:  12/16/17 55.7 kg  11/24/17 59.9 kg  06/12/17 59.9 kg     Intake/Output Summary (Last 24 hours) at 12/18/2017 1249 Last data filed at 12/18/2017 1040 Gross per 24 hour  Intake 360 ml  Output 650 ml  Net -290 ml     Physical Exam  Awake  Alert, Oriented X 3, No new F.N deficits, Normal affect [place].AT,PERRAL Supple Neck,No JVD, No cervical lymphadenopathy appriciated.  Symmetrical Chest wall movement, Good air movement bilaterally, coarse breath sounds right much more than left RRR,No Gallops, Rubs or new Murmurs, No Parasternal Heave +ve B.Sounds, Abd Soft, No tenderness, No organomegaly appriciated, No rebound - guarding or rigidity. No Cyanosis, Clubbing or edema, No new Rash or bruise    Data Review:    CBC Recent Labs  Lab 12/15/17 1505 12/16/17 0317 12/17/17 0442 12/18/17 0505  WBC 5.2 4.3 3.1* 5.9  HGB 13.5 11.5* 14.2 13.4  HCT 43.5 35.7* 46.2* 42.3  PLT 98* 84* 94* 100*  MCV 101.2* 97.8 99.8 98.4  MCH 31.4 31.5 30.7 31.2  MCHC 31.0 32.2 30.7 31.7  RDW 16.0* 15.8* 15.8* 15.8*  LYMPHSABS 0.7  --   --   --   MONOABS 0.4  --   --   --   EOSABS 0.0  --   --   --   BASOSABS 0.0  --   --   --     Chemistries  Recent Labs  Lab 12/15/17 1505 12/16/17 0317 12/17/17 0442  NA 138 136 137  K 3.8 3.3* 4.8  CL 102 105 105  CO2 24 23 22   GLUCOSE 98 76 142*  BUN 31* 27* 22  CREATININE 1.66* 1.22* 1.26*  CALCIUM 8.7* 7.8* 8.0*  MG  --  1.5*  --   AST 48*  --   --   ALT 21  --   --   ALKPHOS 84  --   --   BILITOT 0.9  --   --    ------------------------------------------------------------------------------------------------------------------ No results for input(s): CHOL, HDL, LDLCALC, TRIG, CHOLHDL, LDLDIRECT in the last 72 hours.  Lab Results  Component Value Date   HGBA1C  11/27/2007    5.6 (NOTE)   The ADA recommends the following therapeutic goal for glycemic   control related to Hgb A1C measurement:   Goal of Therapy:   < 7.0% Hgb A1C   Reference: American Diabetes Association: Clinical Practice   Recommendations 2008, Diabetes Care,  2008, 31:(Suppl 1).   ------------------------------------------------------------------------------------------------------------------ No results for input(s):  TSH, T4TOTAL, T3FREE, THYROIDAB in the last 72 hours.  Invalid input(s): FREET3 ------------------------------------------------------------------------------------------------------------------ No results for input(s): VITAMINB12, FOLATE, FERRITIN, TIBC, IRON, RETICCTPCT in the last 72 hours.  Coagulation profile Recent Labs  Lab 12/15/17 1707 12/16/17 0317 12/17/17 0442 12/18/17 0505  INR 1.58 1.65 2.06 3.90    No results for input(s): DDIMER in the last 72 hours.  Cardiac Enzymes Recent Labs  Lab 12/18/17 0505  CKMB 3.4   ------------------------------------------------------------------------------------------------------------------ No results found for: BNP  Micro Results Recent Results (from the past 240 hour(s))  Blood culture (routine x 2)     Status: None (Preliminary result)   Collection Time: 12/15/17  7:21 PM  Result Value Ref Range Status   Specimen Description BLOOD LEFT ANTECUBITAL  Final   Special Requests   Final    BOTTLES DRAWN AEROBIC AND ANAEROBIC Blood Culture adequate volume   Culture  Final    NO GROWTH 3 DAYS Performed at [facility], [place] [address], [place], [place] [address]    Report Status PENDING  Incomplete  Blood culture (routine x 2)     Status: None (Preliminary result)   Collection Time: 12/15/17  8:48 PM  Result Value Ref Range Status   Specimen Description BLOOD RIGHT ANTECUBITAL  Final   Special Requests   Final    BOTTLES DRAWN AEROBIC ONLY Blood Culture adequate volume   Culture   Final    NO GROWTH 3 DAYS Performed at [facility], [place] [address], [place], [place] [address]    Report Status PENDING  Incomplete  Expectorated sputum assessment w rflx to resp cult     Status: None   Collection Time: 12/16/17  2:08 PM  Result Value Ref Range Status   Specimen Description EXPECTORATED SPUTUM  Final   Special Requests NONE  Final   Sputum evaluation   Final    Sputum specimen not acceptable for testing.  Please  recollect.   RESULT CALLED TO, READ BACK BY AND VERIFIED WITH: RN Ranelle Oyster [phone] FCP Performed at [facility], [place] [address], [place], [place] [address]    Report Status 12/16/2017 FINAL  Final  Respiratory Panel by PCR     Status: Abnormal   Collection Time: 12/17/17  2:34 PM  Result Value Ref Range Status   Adenovirus NOT DETECTED NOT DETECTED Final   Coronavirus 229E NOT DETECTED NOT DETECTED Final   Coronavirus HKU1 NOT DETECTED NOT DETECTED Final   Coronavirus NL63 NOT DETECTED NOT DETECTED Final   Coronavirus OC43 NOT DETECTED NOT DETECTED Final   Metapneumovirus NOT DETECTED NOT DETECTED Final   Rhinovirus / Enterovirus NOT DETECTED NOT DETECTED Final   Influenza A NOT DETECTED NOT DETECTED Final   Influenza B NOT DETECTED NOT DETECTED Final   Parainfluenza Virus 1 DETECTED (A) NOT DETECTED Final   Parainfluenza Virus 2 NOT DETECTED NOT DETECTED Final   Parainfluenza Virus 3 NOT DETECTED NOT DETECTED Final   Parainfluenza Virus 4 NOT DETECTED NOT DETECTED Final   Respiratory Syncytial Virus NOT DETECTED NOT DETECTED Final   Bordetella pertussis NOT DETECTED NOT DETECTED Final   Chlamydophila pneumoniae NOT DETECTED NOT DETECTED Final   Mycoplasma pneumoniae NOT DETECTED NOT DETECTED Final    Comment: Performed at [facility], [place] [address], [place], [place] [address]    Radiology Reports Dg Chest 2 View  Result Date: 12/15/2017 CLINICAL DATA:  84 year old female with ongoing shortness of breath, weakness and dry cough for 1 week. Recently diagnosed with pneumonia. EXAM: CHEST - 2 VIEW COMPARISON:  Chest x-ray 11/23/2017.  Chest CT 11/24/2017. FINDINGS: When compared to the prior chest x-ray from 11/23/2017, there continues to be widespread but patchy peripheral predominant areas of airspace consolidation and ground-glass opacities with some associated interstitial prominence. These appear slightly less confluent than the prior examination, particularly  in the region of the right lower lobe, however, the overall distribution is very similar to the prior examination. Trace right pleural effusion. No definite left pleural effusion. No pneumothorax. No evidence of pulmonary edema. Heart size is normal. Upper mediastinal contours are within normal limits. Aortic atherosclerosis. IMPRESSION: 1. Minimal improvement in aeration compared to prior study from 11/23/2017. Findings are favored to reflect a resolving pneumonia, potentially with post inflammatory process such as evolving cryptogenic organizing pneumonia (COP). Based on the appearance of the prior chest CT, the  possibility of multifocal neoplasm such as adenocarcinoma could also be considered, but is not strongly favored. As previously suggested, repeat chest CT (preferably high-resolution chest CT) is recommended in early February 2020 to reassess these findings. 2. Aortic atherosclerosis. Electronically Signed   By: Vinnie Langton M.D.   On: 12/15/2017 17:06   Dg Chest 2 View  Result Date: 11/23/2017 CLINICAL DATA:  Pneumonia.  Cough and shortness of breath. EXAM: CHEST - 2 VIEW COMPARISON:  November 16, 2017 FINDINGS: Patchy infiltrates in the lungs, right greater than left, similar on the right in the interval and mildly more pronounced on the left. Cardiomegaly. The hila and mediastinum are normal. No pneumothorax. No pulmonary nodules or masses. IMPRESSION: Persistent bilateral pulmonary infiltrates, similar on the right in the interval at and mildly more prominent on the left. Recommend treatment with short-term follow-up to ensure resolution. Electronically Signed   By: Dorise Bullion III M.D   On: 11/23/2017 15:43   Ct Chest Wo Contrast  Result Date: 12/16/2017 CLINICAL DATA:  Follow-up pneumonia. EXAM: CT CHEST WITHOUT CONTRAST TECHNIQUE: Multidetector CT imaging of the chest was performed following the standard protocol without IV contrast. COMPARISON:  CT chest 11/24/2017, 12/23/2016.  Multiple prior chest x-rays, most recently yesterday. FINDINGS: Cardiovascular: Marked cardiomegaly. Severe aortic valvular calcification. Mild three-vessel coronary atherosclerosis. Approximate 5.1 cm ascending thoracic aortic aneurysm as noted on the prior examinations. Severe atherosclerosis involving the thoracic and proximal abdominal aorta. Enlarged pulmonary arteries, the RIGHT main pulmonary artery measuring up to 3.2 cm and the LEFT main pulmonary artery measuring up to 3.5 cm. Mediastinum/Nodes: No pathologically enlarged mediastinal, hilar or axillary lymph nodes. No mediastinal masses. Normal-appearing esophagus. Atrophic thyroid gland containing subcentimeter nodules. Lungs/Pleura: Emphysematous changes throughout both lungs. Since the examination 3 weeks ago, marked progression of the now confluent airspace opacities throughout both lungs, with greatest involvement in the [place] and RIGHT LOWER LOBE. The masslike opacity in the LEFT UPPER LOBE has also progressed in the interval. Small BILATERAL pleural effusions are present. Upper Abdomen: Small amount of perihepatic ascites. Visualized upper abdomen otherwise unremarkable for the unenhanced technique. Musculoskeletal: Exaggeration of the usual thoracic kyphosis. Diffuse degenerative disc disease and spondylosis throughout the thoracic spine. IMPRESSION: 1. Marked progression of pneumonia involving both lungs since the CT 3 weeks ago. The most confluent disease is in the RIGHT UPPER LOBE and RIGHT LOWER LOBE. 2. New small BILATERAL pleural effusions. 3. Marked cardiomegaly. Severe aortic valvular calcification. Mild three-vessel coronary atherosclerosis. 4. Ascending thoracic aortic aneurysm measuring up to 5.1 cm as noted previously. Recommendations for follow-up were given on the prior examination three weeks ago and include: Semi-annual imaging followup by CTA or MRA and referral to cardiothoracic surgery if not already obtained. This  recommendation follows 2010 ACCF/AHA/AATS/ACR/ASA/SCA/SCAI/SIR/STS/SVM Guidelines for the Diagnosis and Management of Patients With Thoracic Aortic Disease. Circulation. 2010; 121: J191-Y782. 5. Small amount of perihepatic ascites. Aortic Atherosclerosis (ICD10-I70.0) and Emphysema (ICD10-J43.9). Electronically Signed   By: Evangeline Dakin M.D.   On: 12/16/2017 19:54   Ct Chest Wo Contrast  Result Date: 11/24/2017 CLINICAL DATA:  Follow-up lung nodule.  No chest complaints. EXAM: CT CHEST WITHOUT CONTRAST TECHNIQUE: Multidetector CT imaging of the chest was performed following the standard protocol without IV contrast. COMPARISON:  12/23/2016 FINDINGS: Cardiovascular: The heart is enlarged. Coronary artery calcifications are present. There is calcification dilatation of the aortic root, measuring 5.1 centimeters (previously 4.5 centimeters. Ascending aorta is 5.1 centimeters, previously 5.0 centimeters. Aortic arch is 3.2 centimeters.  Previously 3.1 centimeters. Distal arch is 3.2 centimeters, previously 3.0 centimeters. Descending thoracic aorta is 3.0 centimeters, stable. At the level of the diaphragmatic hiatus, the aorta is 2.8 centimeters, previously 2.6 centimeters. No evidence for displaced intimal calcifications. Mediastinum/Nodes: There is high attenuation material within the contrast, consistent reflux or poor peristalsis. No obstructing mass is identified. No mediastinal, hilar, or axillary adenopathy. Lungs/Pleura: There are numerous ground-glass opacities throughout the lungs bilaterally. These areas are discrete in the UPPER lobes and confluent in the LOWER lobes. These ground-glass opacities obscure the nodules previously described and is difficult to assess for new pulmonary nodules given the presence of significant lung opacities which are likely inflammatory/infectious. No pleural effusion. Upper Abdomen: Stable appearance of low-attenuation lesion within the RIGHT kidney. Musculoskeletal:  Midthoracic spondylosis. IMPRESSION: 1. Ascending aortic aneurysm. Ascending aorta now measures 5.1 centimeters, previously 5.0 centimeters. Aortic root is 5.1 centimeters, previously 4.5 centimeters. Ascending thoracic aortic aneurysm. Recommend semi-annual imaging followup by CTA or MRA and referral to cardiothoracic surgery if not already obtained. This recommendation follows 2010 ACCF/AHA/AATS/ACR/ASA/SCA/SCAI/SIR/STS/SVM Guidelines for the Diagnosis and Management of Patients With Thoracic Aortic Disease. Circulation. 2010; 121: O378-H885 2. New bilateral areas of ground-glass opacities and associated air bronchograms throughout the lungs bilaterally, confluent at the bases. Findings are consistent with infectious process. Follow-up CT of the chest is recommended in 3 months. 3. Debris within the esophagus consistent with poor peristalsis/obstruction, or reflux. 4. Cyst within the UPPER pole of the RIGHT kidney, stable in appearance. Electronically Signed   By: Nolon Nations M.D.   On: 11/24/2017 09:33    Time Spent in minutes  30   Lala Lund M.D on 12/18/2017 at 12:49 PM  To page go to [email or web address] - password [facility]

## 2017-12-18 NOTE — Discharge Instructions (Addendum)
INR daily for the next 3 days and adjust Coumadin dose as needed.   Follow with Primary MD Via, Lennette Bihari, MD in 7 days   Get CBC, CMP, 2 view Chest X ray -  checked  by Primary MD or SNF MD in 5-7 days   Activity: As tolerated with Full fall precautions use walker/cane & assistance as needed  Disposition SNF  Diet: Heart Healthy  with feeding assistance and aspiration precautions.   Special Instructions: If you have smoked or chewed Tobacco  in the last 2 yrs please stop smoking, stop any regular Alcohol  and or any Recreational drug use.  On your next visit with your primary care physician please Get Medicines reviewed and adjusted.  Please request your Prim.MD to go over all Hospital Tests and Procedure/Radiological results at the follow up, please get all Hospital records sent to your Prim MD by signing hospital release before you go home.  If you experience worsening of your admission symptoms, develop shortness of breath, life threatening emergency, suicidal or homicidal thoughts you must seek medical attention immediately by calling 911 or calling your MD immediately  if symptoms less severe.  You Must read complete instructions/literature along with all the possible adverse reactions/side effects for all the Medicines you take and that have been prescribed to you. Take any new Medicines after you have completely understood and accpet all the possible adverse reactions/side effects.           Information on my medicine - Coumadin   (Warfarin)  Why was Coumadin prescribed for you? Coumadin was prescribed for you because you have a blood clot or a medical condition that can cause an increased risk of forming blood clots. Blood clots can cause serious health problems by blocking the flow of blood to the heart, lung, or brain. Coumadin can prevent harmful blood clots from forming. As a reminder your indication for Coumadin is:   Stroke Prevention Because Of Atrial  Fibrillation  What test will check on my response to Coumadin? While on Coumadin (warfarin) you will need to have an INR test regularly to ensure that your dose is keeping you in the desired range. The INR (international normalized ratio) number is calculated from the result of the laboratory test called prothrombin time (PT).  If an INR APPOINTMENT HAS NOT ALREADY BEEN MADE FOR YOU please schedule an appointment to have this lab work done by your health care provider within 7 days. Your INR goal is usually a number between:  2 to 3 or your provider may give you a more narrow range like 2-2.5.  Ask your health care provider during an office visit what your goal INR is.  What  do you need to  know  About  COUMADIN? Take Coumadin (warfarin) exactly as prescribed by your healthcare provider about the same time each day.  DO NOT stop taking without talking to the doctor who prescribed the medication.  Stopping without other blood clot prevention medication to take the place of Coumadin may increase your risk of developing a new clot or stroke.  Get refills before you run out.  What do you do if you miss a dose? If you miss a dose, take it as soon as you remember on the same day then continue your regularly scheduled regimen the next day.  Do not take two doses of Coumadin at the same time.  Important Safety Information A possible side effect of Coumadin (Warfarin) is an increased risk of bleeding.  You should call your healthcare provider right away if you experience any of the following: ? Bleeding from an injury or your nose that does not stop. ? Unusual colored urine (red or dark brown) or unusual colored stools (red or black). ? Unusual bruising for unknown reasons. ? A serious fall or if you hit your head (even if there is no bleeding).  Some foods or medicines interact with Coumadin (warfarin) and might alter your response to warfarin. To help avoid this: ? Eat a balanced diet, maintaining a  consistent amount of Vitamin K. ? Notify your provider about major diet changes you plan to make. ? Avoid alcohol or limit your intake to 1 drink for women and 2 drinks for men per day. (1 drink is 5 oz. wine, 12 oz. beer, or 1.5 oz. liquor.)  Make sure that ANY health care provider who prescribes medication for you knows that you are taking Coumadin (warfarin).  Also make sure the healthcare provider who is monitoring your Coumadin knows when you have started a new medication including herbals and non-prescription products.  Coumadin (Warfarin)  Major Drug Interactions  Increased Warfarin Effect Decreased Warfarin Effect  Alcohol (large quantities) Antibiotics (esp. Septra/Bactrim, Flagyl, Cipro) Amiodarone (Cordarone) Aspirin (ASA) Cimetidine (Tagamet) Megestrol (Megace) NSAIDs (ibuprofen, naproxen, etc.) Piroxicam (Feldene) Propafenone (Rythmol SR) Propranolol (Inderal) Isoniazid (INH) Posaconazole (Noxafil) Barbiturates (Phenobarbital) Carbamazepine (Tegretol) Chlordiazepoxide (Librium) Cholestyramine (Questran) Griseofulvin Oral Contraceptives Rifampin Sucralfate (Carafate) Vitamin K   Coumadin (Warfarin) Major Herbal Interactions  Increased Warfarin Effect Decreased Warfarin Effect  Garlic Ginseng Ginkgo biloba Coenzyme Q10 Green tea St. Johns wort    Coumadin (Warfarin) FOOD Interactions  Eat a consistent number of servings per week of foods HIGH in Vitamin K (1 serving =  cup)  Collards (cooked, or boiled & drained) Kale (cooked, or boiled & drained) Mustard greens (cooked, or boiled & drained) Parsley *serving size only =  cup Spinach (cooked, or boiled & drained) Swiss chard (cooked, or boiled & drained) Turnip greens (cooked, or boiled & drained)  Eat a consistent number of servings per week of foods MEDIUM-HIGH in Vitamin K (1 serving = 1 cup)  Asparagus (cooked, or boiled & drained) Broccoli (cooked, boiled & drained, or raw & chopped) Brussel  sprouts (cooked, or boiled & drained) *serving size only =  cup Lettuce, raw (green leaf, endive, romaine) Spinach, raw Turnip greens, raw & chopped   These websites have more information on Coumadin (warfarin):  FailFactory.se; VeganReport.com.au;

## 2017-12-18 NOTE — Clinical Social Work Note (Signed)
Clinical Social Work Assessment  Patient Details  Name: Julie Mosley MRN: [id number] Date of Birth: 07-Jan-1927  Date of referral:  12/18/17               Reason for consult:  Facility Placement                Permission sought to share information with:  Facility Sport and exercise psychologist, Family Supports Permission granted to share information::  Yes, Verbal Permission Granted  Name::     [name]  Agency::  [facility]  Relationship::  Son  Contact Information:  [phone]  Housing/Transportation Living arrangements for the past 2 months:  [place] of Information:  Patient, Adult Children Patient Interpreter Needed:  None Criminal Activity/Legal Involvement Pertinent to Current Situation/Hospitalization:  No - Comment as needed Significant Relationships:  Adult Children Lives with:  Self Do you feel safe going back to the place where you live?  No Need for family participation in patient care:  No (Coment)  Care giving concerns:  CSW received consult for possible SNF placement at time of discharge. CSW spoke with patient and her son regarding PT recommendation of SNF placement at time of discharge. Patient reported that she was recently at [facility] for a week or so. Patient's son, [name], reported that patient would need to go back to [facility] after her medical workup is complete. CSW to continue to follow and assist with discharge planning needs.   Social Worker assessment / plan:  CSW spoke with patient and her son concerning possibility of rehab at [facility] before returning home.  Employment status:  Retired Nurse, adult PT Recommendations:  [place] / Referral to community resources:  [place]  Patient/Family's Response to care:  Patient recognizes need for rehab before returning home and is agreeable to a SNF in [place]. Patient reported preference for [facility] if insurance  approves her to go. [facility] will begin insurance authorization.   Patient/Family's Understanding of and Emotional Response to Diagnosis, Current Treatment, and Prognosis:  Patient/family is realistic regarding therapy needs and expressed being hopeful for SNF placement. Patient expressed understanding of CSW role and discharge process as well as medical condition. No questions/concerns about plan or treatment.    Emotional Assessment Appearance:  Appears stated age Attitude/Demeanor/Rapport:  Engaged Affect (typically observed):  Accepting, Appropriate Orientation:  Oriented to Self, Oriented to Place, Oriented to  Time, Oriented to Situation Alcohol / Substance use:  Not Applicable Psych involvement (Current and /or in the community):  No (Comment)  Discharge Needs  Concerns to be addressed:  Care Coordination Readmission within the last 30 days:  Yes Current discharge risk:  Dependent with Mobility Barriers to Discharge:  Continued Medical Work up   [employer], LCSW 12/18/2017, 11:49 AM

## 2017-12-18 NOTE — Progress Notes (Addendum)
NAME:  Julie Mosley, MRN:  [id number], DOB:  1926-04-30, LOS: 2 ADMISSION DATE:  12/15/2017, CONSULTATION DATE:  11/24 REFERRING MD: Dr Candiss Norse, CHIEF COMPLAINT:  Pulmonary infiltrates/hypoxia   Brief History   84 year old female former smoker, quit 1980, readmitted 12/15/2017 for progressive pulmonary infiltrates and hypoxia.  PCCM consulted December 17, 2017 for evaluation of infiltrates. Recent admit 10/31 for presumed CAP. CT at that time noted R>L infiltrates and material in the esophagus.  Developed worsening hypoxia & fatigue at home with sats to the 70's prompting return 11/22.  Repeat CT this admit showed worsening of infiltrates.  Prior to admit, pt had a skin cancer removed that became infected.  She has lost ~ 20 lbs since mid October 2019.     Past Medical History  A Fib on coumadin , HTN , TIA , Moderate to Severe Aortic Stenosis   Significant Hospital Events     Consults:  11/24 PCCM   Procedures:    Significant Diagnostic Tests:  Autoimmune 11/15 >>   Micro Data:  BC 11/22 >>  Sputum CX 11/23 >>  RVP 11/24 >> parainfluenza 1 positive  Strep Antigen 11/24 >> negative  Legionella 11/25 >>   Antimicrobials:  Maxipime 11/22 >> 11/23  Vanc 11/22 >> 11/23   Interim History / Subjective:   O2 weaned to 3L/[place].  Afebrile.  I/O-essentially net even.  ESR, strep antigen negative.  Pt reports several episodes of choking on food / food getting hung up.  Pt denies night sweats, SOB, chest pain.   Objective   Blood pressure 110/60, pulse 75, temperature 98.6 F (37 C), temperature source Oral, resp. rate 15, height '5\' 7"'$  (1.702 m), weight 55.7 kg, SpO2 95 %.        Intake/Output Summary (Last 24 hours) at 12/18/2017 1326 Last data filed at 12/18/2017 1040 Gross per 24 hour  Intake 120 ml  Output 650 ml  Net -530 ml   Filed Weights   12/15/17 1615 12/15/17 1951 12/16/17 1639  Weight: 60 kg 54 kg 55.7 kg    Examination: General: frail elderly female sitting in  chair in NAD HEENT: MM pink/moist, dentures (? Of how well they fit with slight lisp in voice), no jvd Neuro: AAOx4, speech clear, MAE  CV: s1s2 rrr, no m/r/g PULM: even/non-labored, lungs bilaterally with bibasilar crackles posteriorly, R>L BJ:SEGB, non-tender, bsx4 active  Extremities: warm/dry, no edema, left ankle wrapped with dressing c/d/i Skin: no rashes or lesions  Resolved Hospital Problem list     Assessment & Plan:   Right > Left Bilateral Infiltrates with associated Hypoxia.   -Initially presumed community-acquired pneumonia however progressively worsened on CT scan despite adequate course of antibiotics.  Strep antigen negative. PCT negative. ESR 2 but was on steroids at time of draw. Note on prior CT, she had retained food in the esophagus and pulmonary nodules.  DDx includes aspiration pneumonitis, atypical pneumonia, inflammatory process such as BOOP/COP, CTD/Autoimmune, undiagnosed COPD (RA enlargement on CT, prior smoker) or malignancy.   Plan: Continue solumedrol 40 mg IV Q8 with taper to off  Follow serial imaging for clearance Wean O2 for sats 90-95% Pulmonary hygiene-IS, mobilize / OOB Autoimmune panel pending, legionella  Hold empiric antibiotics Obtain esophagram given prior imaging with retained food in esophagus Consider outpatient PET scan with possible FOB / biopsy if not improved.  She is frail and risk of procedure may outweigh benefit at this time.   Assess for ambulatory O2 needs prior to discharge  Hospital follow up arranged with Rexene Edison, NP 12/9 at 3:00 PM   Best practice:  Diet: Heart healthy Pain/Anxiety/Delirium protocol (if indicated): N/A VAP protocol (if indicated): N/A DVT prophylaxis: Coumadin GI prophylaxis: N/A Glucose control: N/A Mobility: Bedrest with assistance Code Status: DNR Family Communication: Son and patient updated at bedside.  Disposition: Per primary team  Labs   CBC: Recent Labs  Lab 12/15/17 1505  12/16/17 0317 12/17/17 0442 12/18/17 0505  WBC 5.2 4.3 3.1* 5.9  NEUTROABS 3.9  --   --   --   HGB 13.5 11.5* 14.2 13.4  HCT 43.5 35.7* 46.2* 42.3  MCV 101.2* 97.8 99.8 98.4  PLT 98* 84* 94* 100*    Basic Metabolic Panel: Recent Labs  Lab 12/15/17 1505 12/16/17 0317 12/17/17 0442  NA 138 136 137  K 3.8 3.3* 4.8  CL 102 105 105  CO2 '24 23 22  '$ GLUCOSE 98 76 142*  BUN 31* 27* 22  CREATININE 1.66* 1.22* 1.26*  CALCIUM 8.7* 7.8* 8.0*  MG  --  1.5*  --    GFR: Estimated Creatinine Clearance: 26.6 mL/min (A) (by C-G formula based on SCr of 1.26 mg/dL (H)). Recent Labs  Lab 12/15/17 1505 12/15/17 1518 12/15/17 1812 12/16/17 0317 12/17/17 0442 12/18/17 0505  PROCALCITON  --   --   --   --   --  <0.10  WBC 5.2  --   --  4.3 3.1* 5.9  LATICACIDVEN  --  3.99* 1.25  --   --   --     Liver Function Tests: Recent Labs  Lab 12/15/17 1505  AST 48*  ALT 21  ALKPHOS 84  BILITOT 0.9  PROT 6.1*  ALBUMIN 3.3*   No results for input(s): LIPASE, AMYLASE in the last 168 hours. No results for input(s): AMMONIA in the last 168 hours.  ABG No results found for: PHART, PCO2ART, PO2ART, HCO3, TCO2, ACIDBASEDEF, O2SAT   Coagulation Profile: Recent Labs  Lab 12/15/17 1707 12/16/17 0317 12/17/17 0442 12/18/17 0505  INR 1.58 1.65 2.06 3.90    Cardiac Enzymes: Recent Labs  Lab 12/18/17 0505  CKTOTAL 27*  CKMB 3.4    HbA1C: Hgb A1c MFr Bld  Date/Time Value Ref Range Status  11/27/2007 06:40 AM   Final   5.6 (NOTE)   The ADA recommends the following therapeutic goal for glycemic   control related to Hgb A1C measurement:   Goal of Therapy:   < 7.0% Hgb A1C   Reference: American Diabetes Association: Clinical Practice   Recommendations 2008, Diabetes Care,  2008, 31:(Suppl 1).    CBG: No results for input(s): GLUCAP in the last 168 hours.    Attending to follow.     Noe Gens, NP-C [facility] Pgr: [phone] or if no answer  [phone] 12/18/2017, 1:26 PM  Attending Note:  84 year old female with PMH above presenting with pulmonary infiltrate and evidence of food in the esophagus.  On exam, bibasilar crackles noted.  I reviewed chest CT myself, infiltrate L>R noted.  Discussed with PCCM-NP.  Infiltrate: likely from aspiration, no fever and procal is <0.10  - No need for abx at this time  - Fast taper of the steroids, no treatment of aspiration pneumonitis with steroids  - F/U CXR in 6-8 wks and follow up as outpatient, if infiltrate persist then will consider bronchoscopy.  - Bronchoscopy in such a frail patient is a high risk, will not recommend that at this time  Hypoxemia:  - Titrate O2 for sat of 88-92%  - May need an ambulatory desaturation study prior to discharge for home O2  ILD concern: highly doubtful  - Fast taper of steroids  - F/U on auto-immune work up as outpatient  Dysphagia:  - SLP following  PCCM will sign off, please call back if needed.  Patient seen and examined, agree with above note.  I dictated the care and orders written for this patient under my direction.  Rush Farmer, [place]

## 2017-12-19 ENCOUNTER — Inpatient Hospital Stay (HOSPITAL_COMMUNITY): Payer: Medicare Other

## 2017-12-19 LAB — CBC
HCT: 42.2 % (ref 36.0–46.0)
Hemoglobin: 13.1 g/dL (ref 12.0–15.0)
MCH: 30.4 pg (ref 26.0–34.0)
MCHC: 31 g/dL (ref 30.0–36.0)
MCV: 97.9 fL (ref 80.0–100.0)
NRBC: 0 % (ref 0.0–0.2)
PLATELETS: 112 10*3/uL — AB (ref 150–400)
RBC: 4.31 MIL/uL (ref 3.87–5.11)
RDW: 15.6 % — AB (ref 11.5–15.5)
WBC: 7.6 10*3/uL (ref 4.0–10.5)

## 2017-12-19 LAB — PROTIME-INR
INR: 4.71 — AB
PROTHROMBIN TIME: 43.5 s — AB (ref 11.4–15.2)

## 2017-12-19 LAB — BASIC METABOLIC PANEL
ANION GAP: 7 (ref 5–15)
BUN: 37 mg/dL — ABNORMAL HIGH (ref 8–23)
CALCIUM: 8.3 mg/dL — AB (ref 8.9–10.3)
CO2: 23 mmol/L (ref 22–32)
Chloride: 107 mmol/L (ref 98–111)
Creatinine, Ser: 1 mg/dL (ref 0.44–1.00)
GFR calc Af Amer: 56 mL/min — ABNORMAL LOW (ref >60)
GFR, EST NON AFRICAN AMERICAN: 48 mL/min — AB (ref >60)
GLUCOSE: 129 mg/dL — AB (ref 70–99)
Potassium: 4.7 mmol/L (ref 3.5–5.1)
SODIUM: 137 mmol/L (ref 135–145)

## 2017-12-19 LAB — ANTI-SCLERODERMA ANTIBODY: Scleroderma (Scl-70) (ENA) Antibody, IgG: 0.4 AI (ref 0.0–0.9)

## 2017-12-19 LAB — LEGIONELLA PNEUMOPHILA SEROGP 1 UR AG: L. pneumophila Serogp 1 Ur Ag: NEGATIVE

## 2017-12-19 LAB — RHEUMATOID FACTOR: Rhuematoid fact SerPl-aCnc: <10 IU/mL (ref 0.0–13.9)

## 2017-12-19 LAB — SJOGRENS SYNDROME-A EXTRACTABLE NUCLEAR ANTIBODY

## 2017-12-19 LAB — MAGNESIUM: Magnesium: 2.1 mg/dL (ref 1.7–2.4)

## 2017-12-19 LAB — ANTI-DNA ANTIBODY, DOUBLE-STRANDED: ds DNA Ab: <1 IU/mL (ref 0–9)

## 2017-12-19 LAB — ANTINUCLEAR ANTIBODIES, IFA: ANA Ab, IFA: NEGATIVE

## 2017-12-19 LAB — MPO/PR-3 (ANCA) ANTIBODIES: ANCA Proteinase 3: <3.5 U/mL (ref 0.0–3.5)

## 2017-12-19 LAB — CYCLIC CITRUL PEPTIDE ANTIBODY, IGG/IGA: CCP Antibodies IgG/IgA: 11 units (ref 0–19)

## 2017-12-19 LAB — ALDOLASE: ALDOLASE: 4.1 U/L (ref 3.3–10.3)

## 2017-12-19 LAB — GLOMERULAR BASEMENT MEMBRANE ANTIBODIES: GBM Ab: 4 units (ref 0–20)

## 2017-12-19 LAB — SJOGRENS SYNDROME-B EXTRACTABLE NUCLEAR ANTIBODY: SSB (La) (ENA) Antibody, IgG: <0.2 AI (ref 0.0–0.9)

## 2017-12-19 NOTE — Progress Notes (Signed)
SLP Note:  Discussed pt with MD. He has discussed results with pt and her son, who are in agreement with starting a comfort diet, accepting the risks of aspiration to focus on quality of life. Would recommend the following for comfort feeding:   1. Regular diet and thin liquids (thickening liquids will not reduce airway compromise and can contribute to dehydration, will be thicker to clear the esophagus as well) 2. Frequent oral care to reduce oral bacterial load 3. Cue pt to take small, single sips 4. Cough after swallow could help reduce what enters the airway 5. Take meds whole in puree (crush larger ones)   SLP to follow up briefly for additional education and training to reduce, but not eliminate, the risks of aspiration.    Germain Osgood, M.A. [place] [facility] Environmental education officer [phone] Office [phone]

## 2017-12-19 NOTE — Progress Notes (Addendum)
CRITICAL VALUE ALERT  Critical Value:  INR 4.9   Date & Time Notied:  12/19/17 0545   Provider Notified: YES  Orders Received/Actions taken: Rx to manage warfarin. Rx will hold warfarin.

## 2017-12-19 NOTE — Progress Notes (Signed)
Physical Therapy Treatment Patient Details Name: Julie Mosley MRN: [id number] DOB: 02-17-1926 Today's Date: 12/19/2017    History of Present Illness 84 y.o. female who presented to the [facility] on 12/15/2017 with persistent PNA. The patient was recently admitted 10/31-11/5 and treated for HCAP, D/Cd to a rehab facility; returned home but developed progressive SOB, fatigue. PMH includes A-fib on Coumadin, TIA, aortic stenosis, LBBB and melanoma surgically removed.     PT Comments    Patient's tolerance to treatment today was good.  Patient was in bed with no visitors present upon PT arrival.  Patient's initial O2 sats were measured at 92-93% on 3L O2 via nasal cannula.  Patient was educated and encouraged to use pursed lip breathing throughout treatment.  Patient demonstrated good tolerance for seated LE exercises on EOB.  O2 sats were measured at 93% on 3L [place] after exercises.  Patient was able to ambulate in hallway with no AD and no reports of SOB.  Patient's O2 sats were 92% on 3L [place] at end of treatment.  Patient continues to be a good candidate for SNF placement based on current functional status.    Follow Up Recommendations  SNF     Equipment Recommendations  None recommended by PT    Recommendations for Other Services       Precautions / Restrictions Precautions Precautions: Fall Precaution Comments: Watch SpO2, urinary frequency Restrictions Weight Bearing Restrictions: No    Mobility  Bed Mobility Overal bed mobility: Needs Assistance Bed Mobility: Supine to Sit     Supine to sit: Supervision     General bed mobility comments: Pt required supervision for safety.  Transfers Overall transfer level: Needs assistance   Transfers: Sit to/from Stand Sit to Stand: Min assist         General transfer comment: pt required min A to boost into standing from low bed surface.  Pt demonstrated good hand placement on bed surface.  Ambulation/Gait Ambulation/Gait assistance:  Min guard Gait Distance (Feet): 55 Feet Assistive device: None Gait Pattern/deviations: Step-through pattern;Decreased stride length;Narrow base of support;Drifts right/left Gait velocity: decreased   General Gait Details: Pt required VC to look forward.  Patient continues to be mildly unsteady demonstrating decreased reciprocal arm swing.   Stairs             Wheelchair Mobility    Modified Rankin (Stroke Patients Only)       Balance Overall balance assessment: Needs assistance Sitting-balance support: No upper extremity supported;Feet supported Sitting balance-Leahy Scale: Good     Standing balance support: No upper extremity supported;During functional activity Standing balance-Leahy Scale: Fair                              Cognition Arousal/Alertness: Awake/alert Behavior During Therapy: WFL for tasks assessed/performed Overall Cognitive Status: Within Functional Limits for tasks assessed                                        Exercises General Exercises - Lower Extremity Long Arc Quad: AROM;Both;10 reps;Seated Hip Flexion/Marching: AROM;Both;10 reps;Seated    General Comments        Pertinent Vitals/Pain Pain Assessment: 0-10 Pain Score: 10-Worst pain ever Pain Location: tailbone Pain Descriptors / Indicators: Discomfort Pain Intervention(s): Limited activity within patient's tolerance;Monitored during session;Repositioned    Home Living  Prior Function            PT Goals (current goals can now be found in the care plan section) Acute Rehab PT Goals Patient Stated Goal: none stated PT Goal Formulation: With patient/family Time For Goal Achievement: 12/30/17 Potential to Achieve Goals: Good Progress towards PT goals: Progressing toward goals    Frequency    Min 3X/week      PT Plan Current plan remains appropriate    Co-evaluation              AM-PAC PT "6 Clicks"  Mobility   Outcome Measure  Help needed turning from your back to your side while in a flat bed without using bedrails?: None Help needed moving from lying on your back to sitting on the side of a flat bed without using bedrails?: A Little Help needed moving to and from a bed to a chair (including a wheelchair)?: A Little Help needed standing up from a chair using your arms (e.g., wheelchair or bedside chair)?: A Little Help needed to walk in hospital room?: A Little Help needed climbing 3-5 steps with a railing? : A Lot 6 Click Score: 18    End of Session Equipment Utilized During Treatment: Oxygen;Gait belt Activity Tolerance: Patient tolerated treatment well Patient left: in chair;with call bell/phone within reach;with chair alarm set Nurse Communication: Mobility status PT Visit Diagnosis: Muscle weakness (generalized) (M62.81);Unsteadiness on feet (R26.81)     Time: 1194-1740 PT Time Calculation (min) (ACUTE ONLY): 25 min  Charges:  $Gait Training: 8-22 mins $Therapeutic Activity: 8-22 mins                     [address] Sharolyn Douglas 12/19/2017, 12:34 PM

## 2017-12-19 NOTE — Progress Notes (Signed)
Modified Barium Swallow Progress Note  Patient Details  Name: RUARI DUGGAN MRN: [id number] Date of Birth: [birth year]  Today's Date: 12/19/2017  Modified Barium Swallow completed.  Full report located under Chart Review in the Imaging Section.  Brief recommendations include the following:  Clinical Impression  Pt presents with what is likely a multifactorial dysphagia, with moderate oropharyngeal dysphagia observed on MBS and esophagram showing dysmotility and narrowing. Her oral phase is not well organized, with poor bolus cohesion, inefficient posterior transit, and premature spillage that reaches the pyriform sinuses with all liquid consistencies. Premature spillage also spills directly into the airway with all liquids tested. She primarily penetrates, but penetration goes as deep as to the vocal folds with pt not making any attempts to clear spontaneously, even when mild-moderate amounts of honey thick liquids enter the laryngeal vestibule, coating all the way down to the vocal folds. Thin liquids were aspirated with a weak cough triggered, but she needed cues for a more effortful volitional cough to clear penetrates and aspirates. Attempted a chin tuck, which worsened aspiration. Also attempted oral holding prior to swallow, with inconsistent results. The most effective strategies were a combination of holding the bolus and manipulating bolus size to small spoonfuls. Solids were better managed from an airway protection standpoint, but this is what she has the most subjective complaints about (perhaps related to her esophageal issues). Pt is likely having episodes of aspiration across the course of a meal from a prandial standpoint, but she may also be at risk for post-prandial aspiration. Upon completion of the study and discussion of results, pt verbalized her desire for quality over quantity of life. Should this be her main priority, would consider allowing unrestricted PO intake, with  implementation of strategies to reduce but not eliminate the risk of aspiration. Reached out to MD following testing - will await further delineation of [place] prior to starting any type of diet.   Swallow Evaluation Recommendations       SLP Diet Recommendations: (await GOC - may want to consider regular/thin for comfort)       Medication Administration: Whole meds with puree               Oral Care Recommendations: Oral care QID        Germain Osgood 12/19/2017,10:48 AM   Germain Osgood, M.A. [facility] [employer] education officer [phone] Office [phone]

## 2017-12-19 NOTE — Progress Notes (Signed)
ANTICOAGULATION CONSULT NOTE - Follow Up Consult  Pharmacy Consult for Coumadin Indication: atrial fibrillation  Allergies  Allergen Reactions  . Valium [Diazepam] Other (See Comments)    Stopped breathing    Patient Measurements: Height: 5\' 7"  (170.2 cm) Weight: 122 lb 12.7 oz (55.7 kg) IBW/kg (Calculated) : 61.6  Vital Signs: Temp: 97.9 F (36.6 C) (11/26 0924) Temp Source: Oral (11/26 0924) BP: 135/87 (11/26 0924) Pulse Rate: 69 (11/26 0924)  Labs: Recent Labs    12/17/17 0442 12/18/17 0505 12/19/17 0353  HGB 14.2 13.4 13.1  HCT 46.2* 42.3 42.2  PLT 94* 100* 112*  LABPROT 23.0* 37.6* 43.5*  INR 2.06 3.90 4.71*  CREATININE 1.26*  --  1.00  CKTOTAL  --  27*  --   CKMB  --  3.4  --     Estimated Creatinine Clearance: 33.5 mL/min (by C-G formula based on SCr of 1 mg/dL).  Assessment:  Anticoag: Warfarin PTA for hx Afib.  INR  1.65>2.06>3.9>4.71, Hgb 13.1 and PLT low end 112 (ranged 93-134 over past month).  No bleeding reported.   - PTA Warf dose 3 mg/day  Goal of Therapy:  INR 2-3 Monitor platelets by anticoagulation protocol: Yes   Plan:  - Hold Coumadin tonight - Monitor daily INR and s/sx of bleed  Wilver Tignor S. Alford Highland, PharmD, BCPS Clinical Staff Pharmacist Eilene Ghazi [place] 12/19/2017,10:21 AM

## 2017-12-19 NOTE — Progress Notes (Signed)
[facility] has received insurance approval for patient to dischrage there once medically stable.   Percell Locus Casmir Auguste LCSW [phone]

## 2017-12-19 NOTE — Progress Notes (Signed)
PROGRESS NOTE                                                                                                                                                                                                             Patient Demographics:    Julie Mosley, is a 84 y.o. female, DOB - 1926/02/20, SEG:[id number]  Admit date - 12/15/2017   Admitting Physician Lenore Cordia, MD  Outpatient Primary MD for the patient is Via, Lennette Bihari, MD  LOS - 3  Chief Complaint  Patient presents with  . Pneumonia  . Low O2 sats  . Shortness of Breath       Brief Narrative  Julie Mosley is a 84 y.o. female with medical history significant for persistent atrial fibrillation on Coumadin, hypertension, history of TIA, moderate-severe aortic stenosis, dilated aortic root, and LBBB who presents with shortness of breath.  Patient was recently admitted from 11/23/2017-11/28/2017 for pneumonia and treated with ceftriaxone/azithromycin and discharged on doxycycline.  She was discharged to a rehab facility and did well until returning home about 3 or 4 days ago.  She then developed progressive shortness of breath with dry cough and generalized fatigue.  Home health therapy checked her oxygen saturation today which was reportedly 74%.  She was therefore brought to the emergency department.  She denies any subjective fevers, diaphoresis, chest pain, abdominal pain, dysuria.    Subjective:   Patient in bed, appears comfortable, denies any headache, no fever, no chest pain or pressure, no shortness of breath , no abdominal pain. No focal weakness.    Assessment  & Plan :     1.  Right-sided pulmonary infiltrates recurrent and progressive with hypoxia.  Atypical presentation, initially thought to be cough versus [place] however due to location of infiltrates pulmonary was suspicious that that could be silent regurgitation and aspiration, she was cleared by speech however modified barium swallow was ordered for today  which showed oropharyngeal phase of ongoing silent aspiration.  This likely is a terminal event in a 84 year old patient and this has been going on for several months, discussed plan with patient and her son.  They both want quality of life, comfort foods.  Palliative care for goals of care.  Currently no signs of active infection.  Likely has aspiration pneumonitis but no pneumonia.  2.  Chronic atrial fibrillation.  Mali vas 2 score of at least 3.  Currently on Coumadin which will be continued, not on rate controlling agents.  3.  GERD.  On PPI.  4.  Essential hypertension.  Currently off of Norvasc, ACE inhibitor and HCTZ combination which he takes at home.  Monitor and adjust as needed.  5.  Hypomagnesemia.  Replaced & stable.    Family Communication  :  None present  Code Status :  DNR  Disposition Plan  :  HHPT  Consults  :  Pulmonary  Procedures  :    MBS.  Positive for oropharyngeal phase aspiration.  Does not show much regurgitation.    CT -   1. Marked progression of pneumonia involving both lungs since the CT 3 weeks ago. The most confluent disease is in the RIGHT UPPER LOBE and RIGHT LOWER LOBE. 2. New small BILATERAL pleural effusions. 3. Marked cardiomegaly. Severe aortic valvular calcification. Mild three-vessel coronary atherosclerosis. 4. Ascending thoracic aortic aneurysm measuring up to 5.1 cm as noted previously. Recommendations for follow-up were given on the prior examination three weeks ago and include: Semi-annual imaging followup by CTA or MRA and referral to cardiothoracic surgery if not already obtained. 5. Small amount of perihepatic ascites. Aortic Atherosclerosis (ICD10-I70.0) and Emphysema (ICD10-J43.9).    DVT Prophylaxis  :  Coumadin  Lab Results  Component Value Date   INR 4.71 (HH) 12/19/2017   INR 3.90 12/18/2017   INR 2.06 12/17/2017     Lab Results  Component Value Date   PLT 112 (L) 12/19/2017    Diet :  Diet Order            Diet  regular Room service appropriate? Yes; Fluid consistency: Thin  Diet effective now               Inpatient Medications Scheduled Meds: . guaiFENesin  600 mg Oral BID  . metoCLOPramide  5 mg Oral TID AC & HS  . pantoprazole  40 mg Oral BID  . Warfarin - Pharmacist Dosing Inpatient   Does not apply q1800   Continuous Infusions: PRN Meds:.acetaminophen  Antibiotics  :   Anti-infectives (From admission, onward)   Start     Dose/Rate Route Frequency Ordered Stop   12/17/17 1830  vancomycin (VANCOCIN) IVPB 1000 mg/200 mL premix  Status:  Discontinued     1,000 mg 200 mL/hr over 60 Minutes Intravenous Every 48 hours 12/15/17 1650 12/16/17 1653   12/16/17 2200  vancomycin (VANCOCIN) 500 mg in sodium chloride 0.9 % 100 mL IVPB  Status:  Discontinued     500 mg 100 mL/hr over 60 Minutes Intravenous Every 24 hours 12/16/17 1651 12/17/17 1013   12/16/17 1800  ceFEPIme (MAXIPIME) 2 g in sodium chloride 0.9 % 100 mL IVPB  Status:  Discontinued     2 g 200 mL/hr over 30 Minutes Intravenous Every 24 hours 12/15/17 1650 12/17/17 1013   12/15/17 1700  vancomycin (VANCOCIN) IVPB 1000 mg/200 mL premix     1,000 mg 200 mL/hr over 60 Minutes Intravenous  Once 12/15/17 1649 12/15/17 2154   12/15/17 1700  ceFEPIme (MAXIPIME) 2 g in sodium chloride 0.9 % 100 mL IVPB     2 g 200 mL/hr over 30 Minutes Intravenous  Once 12/15/17 1649 12/15/17 2033          Objective:   Vitals:   12/18/17 2218 12/19/17 0100 12/19/17 0535 12/19/17 0924  BP: (!) 145/79  135/80 135/87  Pulse: 65  62 69  Resp:   18 (!) 22  Temp: (!) 97.4 F (36.3 C)  98.6 F (37 C) 97.9 F (36.6 C)  TempSrc: Oral   Oral  SpO2: (!) 87% 95% 93% 100%  Weight:      Height:        Wt Readings from Last 3 Encounters:  12/16/17 55.7 kg  11/24/17 59.9 kg  06/12/17 59.9 kg     Intake/Output Summary (Last 24 hours) at 12/19/2017 1120 Last data filed at 12/19/2017 0432 Gross per 24 hour  Intake -  Output 200 ml  Net -200 ml       Physical Exam  Awake Alert, Oriented X 3, No new F.N deficits, Normal affect [place].AT,PERRAL Supple Neck,No JVD, No cervical lymphadenopathy appriciated.  Symmetrical Chest wall movement, Good air movement bilaterally, right-sided crackles RRR,No Gallops, Rubs or new Murmurs, No Parasternal Heave +ve B.Sounds, Abd Soft, No tenderness, No organomegaly appriciated, No rebound - guarding or rigidity. No Cyanosis, Clubbing or edema, No new Rash or bruise     Data Review:    CBC Recent Labs  Lab 12/15/17 1505 12/16/17 0317 12/17/17 0442 12/18/17 0505 12/19/17 0353  WBC 5.2 4.3 3.1* 5.9 7.6  HGB 13.5 11.5* 14.2 13.4 13.1  HCT 43.5 35.7* 46.2* 42.3 42.2  PLT 98* 84* 94* 100* 112*  MCV 101.2* 97.8 99.8 98.4 97.9  MCH 31.4 31.5 30.7 31.2 30.4  MCHC 31.0 32.2 30.7 31.7 31.0  RDW 16.0* 15.8* 15.8* 15.8* 15.6*  LYMPHSABS 0.7  --   --   --   --   MONOABS 0.4  --   --   --   --   EOSABS 0.0  --   --   --   --   BASOSABS 0.0  --   --   --   --     Chemistries  Recent Labs  Lab 12/15/17 1505 12/16/17 0317 12/17/17 0442 12/19/17 0353  NA 138 136 137 137  K 3.8 3.3* 4.8 4.7  CL 102 105 105 107  CO2 24 23 22 23   GLUCOSE 98 76 142* 129*  BUN 31* 27* 22 37*  CREATININE 1.66* 1.22* 1.26* 1.00  CALCIUM 8.7* 7.8* 8.0* 8.3*  MG  --  1.5*  --  2.1  AST 48*  --   --   --   ALT 21  --   --   --   ALKPHOS 84  --   --   --   BILITOT 0.9  --   --   --    ------------------------------------------------------------------------------------------------------------------ No results for input(s): CHOL, HDL, LDLCALC, TRIG, CHOLHDL, LDLDIRECT in the last 72 hours.  Lab Results  Component Value Date   HGBA1C  11/27/2007    5.6 (NOTE)   The ADA recommends the following therapeutic goal for glycemic   control related to Hgb A1C measurement:   Goal of Therapy:   < 7.0% Hgb A1C   Reference: American Diabetes Association: Clinical Practice   Recommendations 2008, Diabetes Care,  2008,  31:(Suppl 1).   ------------------------------------------------------------------------------------------------------------------ No results for input(s): TSH, T4TOTAL, T3FREE, THYROIDAB in the last 72 hours.  Invalid input(s): FREET3 ------------------------------------------------------------------------------------------------------------------ No results for input(s): VITAMINB12, FOLATE, FERRITIN, TIBC, IRON, RETICCTPCT in the last 72 hours.  Coagulation profile Recent Labs  Lab 12/15/17 1707 12/16/17 0317 12/17/17 0442 12/18/17 0505 12/19/17 0353  INR 1.58 1.65 2.06 3.90 4.71*    No results for input(s): DDIMER in the last 72 hours.  Cardiac Enzymes Recent Labs  Lab 12/18/17 0505  CKMB 3.4   ------------------------------------------------------------------------------------------------------------------ No results found for: BNP  Micro Results Recent Results (from the past 240  hour(s))  Blood culture (routine x 2)     Status: None (Preliminary result)   Collection Time: 12/15/17  7:21 PM  Result Value Ref Range Status   Specimen Description BLOOD LEFT ANTECUBITAL  Final   Special Requests   Final    BOTTLES DRAWN AEROBIC AND ANAEROBIC Blood Culture adequate volume   Culture   Final    NO GROWTH 4 DAYS Performed at [facility], [place] [address] [place], [place] [address]    Report Status PENDING  Incomplete  Blood culture (routine x 2)     Status: None (Preliminary result)   Collection Time: 12/15/17  8:48 PM  Result Value Ref Range Status   Specimen Description BLOOD RIGHT ANTECUBITAL  Final   Special Requests   Final    BOTTLES DRAWN AEROBIC ONLY Blood Culture adequate volume   Culture   Final    NO GROWTH 4 DAYS Performed at [facility], [place] [address] [place], [place] [address]    Report Status PENDING  Incomplete  Expectorated sputum assessment w rflx to resp cult     Status: None   Collection Time: 12/16/17  2:08 PM  Result Value Ref  Range Status   Specimen Description EXPECTORATED SPUTUM  Final   Special Requests NONE  Final   Sputum evaluation   Final    Sputum specimen not acceptable for testing.  Please recollect.   RESULT CALLED TO, READ BACK BY AND VERIFIED WITH: RN Ranelle Oyster [phone] FCP Performed at [facility], [place] [address] [place], [place] [address]    Report Status 12/16/2017 FINAL  Final  Respiratory Panel by PCR     Status: Abnormal   Collection Time: 12/17/17  2:34 PM  Result Value Ref Range Status   Adenovirus NOT DETECTED NOT DETECTED Final   Coronavirus 229E NOT DETECTED NOT DETECTED Final   Coronavirus HKU1 NOT DETECTED NOT DETECTED Final   Coronavirus NL63 NOT DETECTED NOT DETECTED Final   Coronavirus OC43 NOT DETECTED NOT DETECTED Final   Metapneumovirus NOT DETECTED NOT DETECTED Final   Rhinovirus / Enterovirus NOT DETECTED NOT DETECTED Final   Influenza A NOT DETECTED NOT DETECTED Final   Influenza B NOT DETECTED NOT DETECTED Final   Parainfluenza Virus 1 DETECTED (A) NOT DETECTED Final   Parainfluenza Virus 2 NOT DETECTED NOT DETECTED Final   Parainfluenza Virus 3 NOT DETECTED NOT DETECTED Final   Parainfluenza Virus 4 NOT DETECTED NOT DETECTED Final   Respiratory Syncytial Virus NOT DETECTED NOT DETECTED Final   Bordetella pertussis NOT DETECTED NOT DETECTED Final   Chlamydophila pneumoniae NOT DETECTED NOT DETECTED Final   Mycoplasma pneumoniae NOT DETECTED NOT DETECTED Final    Comment: Performed at [facility], [place] [address], [place],  [address]    Radiology Reports Dg Chest 2 View  Result Date: 12/15/2017 CLINICAL DATA:  84 year old female with ongoing shortness of breath, weakness and dry cough for 1 week. Recently diagnosed with pneumonia. EXAM: CHEST - 2 VIEW COMPARISON:  Chest x-ray 11/23/2017.  Chest CT 11/24/2017. FINDINGS: When compared to the prior chest x-ray from 11/23/2017, there continues to be widespread but patchy peripheral predominant  areas of airspace consolidation and ground-glass opacities with some associated interstitial prominence. These appear slightly less confluent than the prior examination, particularly in the region of the right lower lobe, however, the overall distribution is very similar to the prior examination. Trace right pleural effusion. No definite left pleural effusion.  No pneumothorax. No evidence of pulmonary edema. Heart size is normal. Upper mediastinal contours are within normal limits. Aortic atherosclerosis. IMPRESSION: 1. Minimal improvement in aeration compared to prior study from 11/23/2017. Findings are favored to reflect a resolving pneumonia, potentially with post inflammatory process such as evolving cryptogenic organizing pneumonia (COP). Based on the appearance of the prior chest CT, the possibility of multifocal neoplasm such as adenocarcinoma could also be considered, but is not strongly favored. As previously suggested, repeat chest CT (preferably high-resolution chest CT) is recommended in early February 2020 to reassess these findings. 2. Aortic atherosclerosis. Electronically Signed   By: Vinnie Langton M.D.   On: 12/15/2017 17:06   Dg Chest 2 View  Result Date: 11/23/2017 CLINICAL DATA:  Pneumonia.  Cough and shortness of breath. EXAM: CHEST - 2 VIEW COMPARISON:  November 16, 2017 FINDINGS: Patchy infiltrates in the lungs, right greater than left, similar on the right in the interval and mildly more pronounced on the left. Cardiomegaly. The hila and mediastinum are normal. No pneumothorax. No pulmonary nodules or masses. IMPRESSION: Persistent bilateral pulmonary infiltrates, similar on the right in the interval at and mildly more prominent on the left. Recommend treatment with short-term follow-up to ensure resolution. Electronically Signed   By: Dorise Bullion III M.D   On: 11/23/2017 15:43   Ct Chest Wo Contrast  Result Date: 12/16/2017 CLINICAL DATA:  Follow-up pneumonia. EXAM: CT CHEST  WITHOUT CONTRAST TECHNIQUE: Multidetector CT imaging of the chest was performed following the standard protocol without IV contrast. COMPARISON:  CT chest 11/24/2017, 12/23/2016. Multiple prior chest x-rays, most recently yesterday. FINDINGS: Cardiovascular: Marked cardiomegaly. Severe aortic valvular calcification. Mild three-vessel coronary atherosclerosis. Approximate 5.1 cm ascending thoracic aortic aneurysm as noted on the prior examinations. Severe atherosclerosis involving the thoracic and proximal abdominal aorta. Enlarged pulmonary arteries, the RIGHT main pulmonary artery measuring up to 3.2 cm and the LEFT main pulmonary artery measuring up to 3.5 cm. Mediastinum/Nodes: No pathologically enlarged mediastinal, hilar or axillary lymph nodes. No mediastinal masses. Normal-appearing esophagus. Atrophic thyroid gland containing subcentimeter nodules. Lungs/Pleura: Emphysematous changes throughout both lungs. Since the examination 3 weeks ago, marked progression of the now confluent airspace opacities throughout both lungs, with greatest involvement in the [place] and RIGHT LOWER LOBE. The masslike opacity in the LEFT UPPER LOBE has also progressed in the interval. Small BILATERAL pleural effusions are present. Upper Abdomen: Small amount of perihepatic ascites. Visualized upper abdomen otherwise unremarkable for the unenhanced technique. Musculoskeletal: Exaggeration of the usual thoracic kyphosis. Diffuse degenerative disc disease and spondylosis throughout the thoracic spine. IMPRESSION: 1. Marked progression of pneumonia involving both lungs since the CT 3 weeks ago. The most confluent disease is in the RIGHT UPPER LOBE and RIGHT LOWER LOBE. 2. New small BILATERAL pleural effusions. 3. Marked cardiomegaly. Severe aortic valvular calcification. Mild three-vessel coronary atherosclerosis. 4. Ascending thoracic aortic aneurysm measuring up to 5.1 cm as noted previously. Recommendations for follow-up  were given on the prior examination three weeks ago and include: Semi-annual imaging followup by CTA or MRA and referral to cardiothoracic surgery if not already obtained. This recommendation follows 2010 ACCF/AHA/AATS/ACR/ASA/SCA/SCAI/SIR/STS/SVM Guidelines for the Diagnosis and Management of Patients With Thoracic Aortic Disease. Circulation. 2010; 121: F027-X412. 5. Small amount of perihepatic ascites. Aortic Atherosclerosis (ICD10-I70.0) and Emphysema (ICD10-J43.9). Electronically Signed   By: Evangeline Dakin M.D.   On: 12/16/2017 19:54   Ct Chest Wo Contrast  Result Date: 11/24/2017 CLINICAL DATA:  Follow-up lung nodule.  No chest complaints.  EXAM: CT CHEST WITHOUT CONTRAST TECHNIQUE: Multidetector CT imaging of the chest was performed following the standard protocol without IV contrast. COMPARISON:  12/23/2016 FINDINGS: Cardiovascular: The heart is enlarged. Coronary artery calcifications are present. There is calcification dilatation of the aortic root, measuring 5.1 centimeters (previously 4.5 centimeters. Ascending aorta is 5.1 centimeters, previously 5.0 centimeters. Aortic arch is 3.2 centimeters. Previously 3.1 centimeters. Distal arch is 3.2 centimeters, previously 3.0 centimeters. Descending thoracic aorta is 3.0 centimeters, stable. At the level of the diaphragmatic hiatus, the aorta is 2.8 centimeters, previously 2.6 centimeters. No evidence for displaced intimal calcifications. Mediastinum/Nodes: There is high attenuation material within the contrast, consistent reflux or poor peristalsis. No obstructing mass is identified. No mediastinal, hilar, or axillary adenopathy. Lungs/Pleura: There are numerous ground-glass opacities throughout the lungs bilaterally. These areas are discrete in the UPPER lobes and confluent in the LOWER lobes. These ground-glass opacities obscure the nodules previously described and is difficult to assess for new pulmonary nodules given the presence of significant lung  opacities which are likely inflammatory/infectious. No pleural effusion. Upper Abdomen: Stable appearance of low-attenuation lesion within the RIGHT kidney. Musculoskeletal: Midthoracic spondylosis. IMPRESSION: 1. Ascending aortic aneurysm. Ascending aorta now measures 5.1 centimeters, previously 5.0 centimeters. Aortic root is 5.1 centimeters, previously 4.5 centimeters. Ascending thoracic aortic aneurysm. Recommend semi-annual imaging followup by CTA or MRA and referral to cardiothoracic surgery if not already obtained. This recommendation follows 2010 ACCF/AHA/AATS/ACR/ASA/SCA/SCAI/SIR/STS/SVM Guidelines for the Diagnosis and Management of Patients With Thoracic Aortic Disease. Circulation. 2010; 121: P102-H852 2. New bilateral areas of ground-glass opacities and associated air bronchograms throughout the lungs bilaterally, confluent at the bases. Findings are consistent with infectious process. Follow-up CT of the chest is recommended in 3 months. 3. Debris within the esophagus consistent with poor peristalsis/obstruction, or reflux. 4. Cyst within the UPPER pole of the RIGHT kidney, stable in appearance. Electronically Signed   By: Nolon Nations M.D.   On: 11/24/2017 09:33   Dg Esophagus  Result Date: 12/19/2017 CLINICAL DATA:  Aspiration pneumonia. EXAM: ESOPHOGRAM/BARIUM SWALLOW TECHNIQUE: Single contrast examination was performed using  thin barium. FLUOROSCOPY TIME:  Fluoroscopy Time:  2 minutes Radiation Exposure Index (if provided by the fluoroscopic device): Number of Acquired Spot Images: 0 COMPARISON:  CT 12/16/2017 FINDINGS: Fluoroscopic evaluation of swallowing demonstrates disruption of primary esophageal peristaltic waves and stasis of contrast. There is tapering of the distal esophagus with smooth long segment mild narrowing in the distal esophagus. No focal stricture. No fold thickening or mass. The 13 mm barium tablet sticks in the mid to distal esophagus. IMPRESSION: Slight generalized  tapering and long segment narrowing of the distal esophagus. The 13 mm barium tablet sticks in this area. Esophageal dysmotility. Electronically Signed   By: Rolm Baptise M.D.   On: 12/19/2017 08:53   Dg Swallowing Func-speech Pathology  Result Date: 12/19/2017 Objective Swallowing Evaluation: Type of Study: MBS-Modified Barium Swallow Study  Patient Details Name: DEBAR PLATE MRN: [id number] Date of Birth: 01-09-1927 Today's Date: 12/19/2017 Time: SLP Start Time (ACUTE ONLY): 0844 -SLP Stop Time (ACUTE ONLY): 0907 SLP Time Calculation (min) (ACUTE ONLY): 23 min Past Medical History: Past Medical History: Diagnosis Date . Acute respiratory failure with hypoxia ([place])  . AKI (acute kidney injury) ([place])  . Cancer ([place])  . Chronic anticoagulation 12/13/2012  Followed by Dr. Chapman Fitch . Dilated aortic root ([place]) 12/13/2012  4.5 cm 2013.  Marland Kitchen HCAP (healthcare-associated pneumonia)  . Hypertension  . Lung nodule seen on imaging study  . Mild aortic  stenosis 12/13/2012 . TIA (transient ischemic attack)  Past Surgical History: Past Surgical History: Procedure Laterality Date . BREAST LUMPECTOMY    bilateral . CESAREAN SECTION   . HIP SURGERY   . SKIN BIOPSY    From left foot. HPI: 84 y.o. female who presented to the [facility] on 12/15/2017 with persistent PNA. The patient was recently admitted 10/31-11/5 and treated for HCAP, D/Cd to a rehab facility; returned home but developed progressive SOB, fatigue. PMH includes A-fib on Coumadin, TIA, aortic stenosis, LBBB and melanoma surgically removed.  Subjective: pt alert, HOH Assessment / Plan / Recommendation CHL IP CLINICAL IMPRESSIONS 12/19/2017 Clinical Impression Pt presents with what is likely a multifactorial dysphagia, with moderate oropharyngeal dysphagia observed on MBS and esophagram showing dysmotility and narrowing. Her oral phase is not well organized, with poor bolus cohesion, inefficient posterior transit, and premature spillage that reaches the pyriform sinuses with all  liquid consistencies. Premature spillage also spills directly into the airway with all liquids tested. She primarily penetrates, but penetration goes as deep as to the vocal folds with pt not making any attempts to clear spontaneously, even when mild-moderate amounts of honey thick liquids enter the laryngeal vestibule, coating all the way down to the vocal folds. Thin liquids were aspirated with a weak cough triggered, but she needed cues for a more effortful volitional cough to clear penetrates and aspirates. Attempted a chin tuck, which worsened aspiration. Also attempted oral holding prior to swallow, with inconsistent results. The most effective strategies were a combination of holding the bolus and manipulating bolus size to small spoonfuls. Solids were better managed from an airway protection standpoint, but this is what she has the most subjective complaints about (perhaps related to her esophageal issues). Pt is likely having episodes of aspiration across the course of a meal from a prandial standpoint, but she may also be at risk for post-prandial aspiration. Upon completion of the study and discussion of results, pt verbalized her desire for quality over quantity of life. Should this be her main priority, would consider allowing unrestricted PO intake, with implementation of strategies to reduce but not eliminate the risk of aspiration. Reached out to MD following testing - will await further delineation of [place] prior to starting any type of diet. SLP Visit Diagnosis Dysphagia, oropharyngeal phase (R13.12) Attention and concentration deficit following -- Frontal lobe and executive function deficit following -- Impact on safety and function Moderate aspiration risk   CHL IP TREATMENT RECOMMENDATION 12/19/2017 Treatment Recommendations Therapy as outlined in treatment plan below   Prognosis 12/19/2017 Prognosis for Safe Diet Advancement Fair Barriers to Reach Goals Time post onset;Severity of deficits  Barriers/Prognosis Comment -- CHL IP DIET RECOMMENDATION 12/19/2017 SLP Diet Recommendations (No Data) Liquid Administration via -- Medication Administration Whole meds with puree Compensations -- Postural Changes --   CHL IP OTHER RECOMMENDATIONS 12/19/2017 Recommended Consults -- Oral Care Recommendations Oral care QID Other Recommendations --   CHL IP FOLLOW UP RECOMMENDATIONS 12/19/2017 Follow up Recommendations (No Data)   CHL IP FREQUENCY AND DURATION 12/19/2017 Speech Therapy Frequency (ACUTE ONLY) min 2x/week Treatment Duration 2 weeks      CHL IP ORAL PHASE 12/19/2017 Oral Phase Impaired Oral - Pudding Teaspoon -- Oral - Pudding Cup -- Oral - Honey Teaspoon -- Oral - Honey Cup Decreased bolus cohesion;Premature spillage Oral - Nectar Teaspoon -- Oral - Nectar Cup Decreased bolus cohesion;Premature spillage Oral - Nectar Straw -- Oral - Thin Teaspoon Decreased bolus cohesion;Premature spillage Oral - Thin Cup Decreased  bolus cohesion;Premature spillage Oral - Thin Straw -- Oral - Puree Decreased bolus cohesion Oral - Mech Soft Decreased bolus cohesion Oral - Regular -- Oral - Multi-Consistency -- Oral - Pill -- Oral Phase - Comment --  CHL IP PHARYNGEAL PHASE 12/19/2017 Pharyngeal Phase Impaired Pharyngeal- Pudding Teaspoon -- Pharyngeal -- Pharyngeal- Pudding Cup -- Pharyngeal -- Pharyngeal- Honey Teaspoon -- Pharyngeal -- Pharyngeal- Honey Cup Penetration/Aspiration before swallow;Pharyngeal residue - valleculae Pharyngeal Material enters airway, CONTACTS cords and not ejected out Pharyngeal- Nectar Teaspoon -- Pharyngeal -- Pharyngeal- Nectar Cup Pharyngeal residue - valleculae;Penetration/Aspiration before swallow Pharyngeal Material enters airway, CONTACTS cords and not ejected out Pharyngeal- Nectar Straw -- Pharyngeal -- Pharyngeal- Thin Teaspoon Penetration/Aspiration before swallow Pharyngeal Material enters airway, remains ABOVE vocal cords and not ejected out Pharyngeal- Thin Cup  Penetration/Aspiration before swallow Pharyngeal Material enters airway, passes BELOW cords then ejected out Pharyngeal- Thin Straw -- Pharyngeal -- Pharyngeal- Puree WFL Pharyngeal -- Pharyngeal- Mechanical Soft WFL Pharyngeal -- Pharyngeal- Regular -- Pharyngeal -- Pharyngeal- Multi-consistency -- Pharyngeal -- Pharyngeal- Pill -- Pharyngeal -- Pharyngeal Comment --  CHL IP CERVICAL ESOPHAGEAL PHASE 12/19/2017 Cervical Esophageal Phase WFL Pudding Teaspoon -- Pudding Cup -- Honey Teaspoon -- Honey Cup -- Nectar Teaspoon -- Nectar Cup -- Nectar Straw -- Thin Teaspoon -- Thin Cup -- Thin Straw -- Puree -- Mechanical Soft -- Regular -- Multi-consistency -- Pill -- Cervical Esophageal Comment -- Germain Osgood 12/19/2017, 10:48 AM  Germain Osgood, M.A. [place] [facility] Pager [phone] Office [phone]              Time Spent in minutes  30   Lala Lund M.D on 12/19/2017 at 11:20 AM  To page go to [email or web address] - password [facility]

## 2017-12-20 ENCOUNTER — Other Ambulatory Visit: Payer: Medicare Other

## 2017-12-20 DIAGNOSIS — T17908S Unspecified foreign body in respiratory tract, part unspecified causing other injury, sequela: Secondary | ICD-10-CM

## 2017-12-20 DIAGNOSIS — R911 Solitary pulmonary nodule: Secondary | ICD-10-CM

## 2017-12-20 DIAGNOSIS — Z515 Encounter for palliative care: Secondary | ICD-10-CM

## 2017-12-20 DIAGNOSIS — T17908A Unspecified foreign body in respiratory tract, part unspecified causing other injury, initial encounter: Secondary | ICD-10-CM

## 2017-12-20 DIAGNOSIS — Z7189 Other specified counseling: Secondary | ICD-10-CM

## 2017-12-20 LAB — CBC
HCT: 42.7 % (ref 36.0–46.0)
Hemoglobin: 13.3 g/dL (ref 12.0–15.0)
MCH: 30.7 pg (ref 26.0–34.0)
MCHC: 31.1 g/dL (ref 30.0–36.0)
MCV: 98.6 fL (ref 80.0–100.0)
NRBC: 0 % (ref 0.0–0.2)
PLATELETS: 126 10*3/uL — AB (ref 150–400)
RBC: 4.33 MIL/uL (ref 3.87–5.11)
RDW: 15.6 % — AB (ref 11.5–15.5)
WBC: 8.6 10*3/uL (ref 4.0–10.5)

## 2017-12-20 LAB — BASIC METABOLIC PANEL
Anion gap: 4 — ABNORMAL LOW (ref 5–15)
BUN: 37 mg/dL — ABNORMAL HIGH (ref 8–23)
CALCIUM: 8.3 mg/dL — AB (ref 8.9–10.3)
CO2: 28 mmol/L (ref 22–32)
Chloride: 105 mmol/L (ref 98–111)
Creatinine, Ser: 0.98 mg/dL (ref 0.44–1.00)
GFR calc Af Amer: 59 mL/min — ABNORMAL LOW (ref >60)
GFR, EST NON AFRICAN AMERICAN: 51 mL/min — AB (ref >60)
Glucose, Bld: 95 mg/dL (ref 70–99)
Potassium: 4.9 mmol/L (ref 3.5–5.1)
SODIUM: 137 mmol/L (ref 135–145)

## 2017-12-20 LAB — CULTURE, BLOOD (ROUTINE X 2)
CULTURE: NO GROWTH
CULTURE: NO GROWTH
Special Requests: ADEQUATE
Special Requests: ADEQUATE

## 2017-12-20 LAB — PROTIME-INR
INR: 4.92 — AB
PROTHROMBIN TIME: 45.1 s — AB (ref 11.4–15.2)

## 2017-12-20 LAB — MAGNESIUM: MAGNESIUM: 2 mg/dL (ref 1.7–2.4)

## 2017-12-20 MED ORDER — PANTOPRAZOLE SODIUM 40 MG PO TBEC: 40.0000 mg | DELAYED_RELEASE_TABLET | Freq: Every day | ORAL | Status: DC

## 2017-12-20 MED ORDER — MORPHINE 100MG IN NS 100ML (1MG/ML) PREMIX INFUSION: 5.0000 mg/h | INTRAVENOUS | Status: DC

## 2017-12-20 MED ORDER — PHYTONADIONE 1 MG/0.5 ML ORAL SOLUTION
1.0000 mg | Freq: Once | ORAL | Status: AC
  Administered 2017-12-20: 1 mg via ORAL
  Filled 2017-12-20: qty 0.5

## 2017-12-20 MED ORDER — WARFARIN SODIUM 3 MG PO TABS: 3.0000 mg | ORAL_TABLET | Freq: Every day | ORAL | Status: AC

## 2017-12-20 MED ORDER — VITAMIN K1 10 MG/ML IJ SOLN
0.5000 mg | Freq: Once | INTRAVENOUS | Status: DC
  Administered 2017-12-20: 0.5 mg via INTRAVENOUS
  Filled 2017-12-20: qty 0.05

## 2017-12-20 MED ORDER — PREDNISONE 5 MG PO TABS: ORAL_TABLET | ORAL | 0 refills | Status: DC

## 2017-12-20 NOTE — Progress Notes (Signed)
Physical Therapy Treatment Patient Details Name: Julie Mosley MRN: [id number] DOB: [birth year] Today's Date: 12/20/2017    History of Present Illness 84 y.o. female who presented to the [facility] on 12/15/2017 with persistent PNA. The patient was recently admitted 10/31-11/5 and treated for HCAP, D/Cd to a rehab facility; returned home but developed progressive SOB, fatigue. PMH includes A-fib on Coumadin, TIA, aortic stenosis, LBBB and melanoma surgically removed.     PT Comments    Patient's tolerance to treatment today was good.  Patient was in chair with OT present upon PT arrival.  Patient's son visited during treatment.  Patient's O2 sats were measured at 84-85% on 4L O2 via [place] initially.  Pt was educated on pursed lip breathing and stated she felt SOB when coughing.  Pt's O2 sats dropped as low as 82% on 4L O2 via [place].  Increased pt's oxygen to 6L O2 via [place] prior to ambulation with O2 recovering at 92%.  Patient demonstrated good activity tolerance during ambulation with no complaints of SOB.  Patient returned to room and was able to perform self pericare only requiring min guard for safety.  Therapist was unable to obtain O2 sats at end of treatment due to pt's poor perfusion.  Pt was left on 4L O2 at end of treatment.  Patient continues to be a good candidate for SNF placement based on current functional status.     Follow Up Recommendations  SNF     Equipment Recommendations  None recommended by PT    Recommendations for Other Services       Precautions / Restrictions Precautions Precautions: Fall Precaution Comments: Watch SpO2, urinary frequency Restrictions Weight Bearing Restrictions: No    Mobility  Bed Mobility               General bed mobility comments: Pt OOB upon arrival  Transfers Overall transfer level: Needs assistance Equipment used: None Transfers: Sit to/from Stand Sit to Stand: Supervision Stand pivot transfers: Min guard       General transfer  comment: Cueing for body mechanics only, no physical assist  Ambulation/Gait Ambulation/Gait assistance: Min guard Gait Distance (Feet): 60 Feet Assistive device: None Gait Pattern/deviations: Step-through pattern;Decreased stride length;Drifts right/left;Narrow base of support Gait velocity: decreased   General Gait Details: Pt continues to require VC to look forward.  Pt continues to be mildly unsteady requiring VC to place arms by her side to increase reciprocal arm swing.   Stairs             Wheelchair Mobility    Modified Rankin (Stroke Patients Only)       Balance Overall balance assessment: Mild deficits observed, not formally tested Sitting-balance support: No upper extremity supported;Feet supported Sitting balance-Leahy Scale: Good     Standing balance support: No upper extremity supported;During functional activity Standing balance-Leahy Scale: Fair Standing balance comment: patient able to stand, pull up underwear, and perform self pericare.                            Cognition Arousal/Alertness: Awake/alert Behavior During Therapy: WFL for tasks assessed/performed Overall Cognitive Status: Within Functional Limits for tasks assessed                                        Exercises      General Comments  Pertinent Vitals/Pain Pain Assessment: No/denies pain Faces Pain Scale: Hurts a little bit Pain Location: tailbone Pain Descriptors / Indicators: Discomfort Pain Intervention(s): Limited activity within patient's tolerance;Monitored during session    Home Living                      Prior Function            PT Goals (current goals can now be found in the care plan section) Acute Rehab PT Goals Patient Stated Goal: none stated PT Goal Formulation: With patient/family Time For Goal Achievement: 12/30/17 Potential to Achieve Goals: Good Progress towards PT goals: Progressing toward goals     Frequency    Min 3X/week      PT Plan Current plan remains appropriate    Co-evaluation              AM-PAC PT "6 Clicks" Mobility   Outcome Measure  Help needed turning from your back to your side while in a flat bed without using bedrails?: None Help needed moving from lying on your back to sitting on the side of a flat bed without using bedrails?: A Little Help needed moving to and from a bed to a chair (including a wheelchair)?: A Little Help needed standing up from a chair using your arms (e.g., wheelchair or bedside chair)?: A Little Help needed to walk in hospital room?: A Little Help needed climbing 3-5 steps with a railing? : A Lot 6 Click Score: 18    End of Session Equipment Utilized During Treatment: Oxygen;Gait belt Activity Tolerance: Patient tolerated treatment well Patient left: in chair;with call bell/phone within reach;with family/visitor present Nurse Communication: Mobility status PT Visit Diagnosis: Muscle weakness (generalized) (M62.81);Unsteadiness on feet (R26.81)     Time: 6237-6283 PT Time Calculation (min) (ACUTE ONLY): 27 min  Charges:  $Gait Training: 8-22 mins $Therapeutic Activity: 8-22 mins                     [id number] SPTA    Angila Wombles 12/20/2017, 10:48 AM

## 2017-12-20 NOTE — Discharge Summary (Signed)
Julie Mosley QZE:[id number] DOB: May 04, 1926 DOA: 12/15/2017  PCP: Dineen Kid, MD  Admit date: 12/15/2017  Discharge date: 12/20/2017  Admitted From: Home   Disposition:  SNF   Recommendations for Outpatient Follow-up:   Follow up with PCP in 1-2 weeks  PCP Please obtain BMP/CBC, 2 view CXR in 1week,  (see Discharge instructions)   PCP Please follow up on the following pending results:    Home Health: None   Equipment/Devices: None  Consultations: Pulm Discharge Condition: Fair   CODE STATUS: DNR   Diet Recommendation: Heart Healthy with feeding assistance and aspiration precautions    Chief Complaint  Patient presents with  . Pneumonia  . Low O2 sats  . Shortness of Breath     Brief history of present illness from the day of admission and additional interim summary    Julie Mosley a 84 y.o.femalewith medical history significant forpersistentatrial fibrillation on Coumadin, hypertension, history of TIA, moderate-severe aortic stenosis, dilated aortic root,andLBBB who presents with shortness of breath.Patient was recently admitted from 11/23/2017-11/28/2017 for pneumonia and treated with ceftriaxone/azithromycin and discharged on doxycycline. She was discharged to arehab facility and did well until returning home about 3 or 4 days ago. She then developed progressive shortness of breath with dry cough and generalized fatigue. Home health therapy checked her oxygen saturation today which was reportedly 74%. She was therefore brought to the emergency department. She denies any subjective fevers, diaphoresis, chest pain, abdominal pain, dysuria.                                                                  Hospital Course   1.  Right-sided pulmonary infiltrates recurrent and progressive with  hypoxia.  Atypical presentation, initially thought to be cough versus [place] however due to location of infiltrates pulmonary was suspicious that that could be silent regurgitation and aspiration, she was cleared by speech however modified barium swallow was ordered for today which showed oropharyngeal phase of ongoing silent aspiration.  This likely is a terminal event in a 84 year old patient and this has been going on for several months, discussed plan with patient and her son.  They both want quality of life, comfort foods.    Family thinks that she responded well to short burst of steroids and want her to get a short course of steroids which will be provided, no signs of active infection, will be discharged to SNF on regular diet with close follow-up and monitoring by speech therapy at SNF.  Definitely recommend one-time follow-up with pulmonary early next month, she has a pending appointment.  If checks x-ray shows improvement then other possible diagnosis like both can be reconsidered if it shows continued evidence of ongoing aspiration then she can be transition towards comfort measures.   2.  Chronic atrial  fibrillation.  Mali vas 2 score of at least 3.  Currently on Coumadin INR was 4.9 today, #8 has been gradually uptrending, 1 mg of oral vitamin K given, Coumadin held, request SNF staff to check INR daily for 3 days and adjust Coumadin dose as needed.  3.  GERD.  On PPI.  4.  Essential hypertension.  Continue home regimen.  5.  Hypomagnesemia.  Replaced & stable.    Discharge diagnosis     Principal Problem:   Acute respiratory failure with hypoxia (HCC) Active Problems:   Hypertension   A-fib (HCC)   Postinflammatory pulmonary fibrosis (HCC)   Pneumonia   Open wound of left ankle   Hypokalemia   AKI (acute kidney injury) ([place])   Pulmonary nodule less than 6 cm determined by computed tomography of lung   Lung nodule seen on imaging study   Esophageal reflux   ILD  (interstitial lung disease) ([place])   Pulmonary infiltrate   Hypoxemia    Discharge instructions    Discharge Instructions    Diet - low sodium heart healthy   Complete by:  As directed    Discharge instructions   Complete by:  As directed    INR daily for the next 3 days and adjust Coumadin dose as needed.   Follow with Primary MD Via, Lennette Bihari, MD in 7 days   Get CBC, CMP, 2 view Chest X ray -  checked  by Primary MD or SNF MD in 5-7 days   Activity: As tolerated with Full fall precautions use walker/cane & assistance as needed  Disposition SNF  Diet: Heart Healthy  with feeding assistance and aspiration precautions.   Special Instructions: If you have smoked or chewed Tobacco  in the last 2 yrs please stop smoking, stop any regular Alcohol  and or any Recreational drug use.  On your next visit with your primary care physician please Get Medicines reviewed and adjusted.  Please request your Prim.MD to go over all Hospital Tests and Procedure/Radiological results at the follow up, please get all Hospital records sent to your Prim MD by signing hospital release before you go home.  If you experience worsening of your admission symptoms, develop shortness of breath, life threatening emergency, suicidal or homicidal thoughts you must seek medical attention immediately by calling 911 or calling your MD immediately  if symptoms less severe.  You Must read complete instructions/literature along with all the possible adverse reactions/side effects for all the Medicines you take and that have been prescribed to you. Take any new Medicines after you have completely understood and accpet all the possible adverse reactions/side effects.   Increase activity slowly   Complete by:  As directed       Discharge Medications   Allergies as of 12/20/2017      Reactions   Valium [diazepam] Other (See Comments)   Stopped breathing      Medication List    TAKE these medications     acetaminophen 650 MG CR tablet Commonly known as:  TYLENOL Take 650 mg by mouth every 8 (eight) hours as needed for pain.   amLODipine 5 MG tablet Commonly known as:  NORVASC TAKE 1 TABLET BY MOUTH  DAILY   CALCIUM 600+D 600-200 MG-UNIT Tabs Generic drug:  Calcium Carbonate-Vitamin D Take 1 tablet by mouth daily.   CENTRUM SILVER PO Take 1 tablet by mouth daily.   Fish Oil 1000 MG Caps Take 1 capsule by mouth daily.   hydrochlorothiazide 25 MG  tablet Commonly known as:  HYDRODIURIL TAKE ONE TABLET BY MOUTH ONCE DAILY   lisinopril 20 MG tablet Commonly known as:  PRINIVIL,ZESTRIL TAKE 1 TABLET BY MOUTH TWO  TIMES DAILY   pantoprazole 40 MG tablet Commonly known as:  PROTONIX Take 1 tablet (40 mg total) by mouth daily.   predniSONE 5 MG tablet Commonly known as:  DELTASONE Take 8 Pills PO for 3 days, 6 Pills PO for 3 days, 4 Pills PO for 3 days, 2 Pills PO for 3 days, 1 Pills PO for 3 days, 1/2 Pill  PO for 3 days then STOP.   warfarin 3 MG tablet Commonly known as:  COUMADIN Take 1 tablet (3 mg total) by mouth daily. SNF MD to monitor INR daily for the next 3 days and adjust Coumadin dose as needed Start taking on:  12/22/2017 What changed:    additional instructions  These instructions start on 12/22/2017. If you are unsure what to do until then, ask your doctor or other care provider.        Contact information for follow-up providers    [facility] Follow up.   Specialty:  Home Health Services Contact information: [address] [place] [address] [phone]        Melvenia Needles, NP Follow up on 01/01/2018.   Specialty:  Pulmonary Disease Why:  Appt at 3:00 PM.  Please arrive at 2:45 for check in.   Contact information: [place] [address] [phone]        Dineen Kid, MD. Schedule an appointment as soon as possible for a visit in 1 week(s).   Specialty:  Family Medicine Contact  information: [place] [address] [phone]            Contact information for after-discharge care    Destination    [facility] Preferred SNF .   Service:  Skilled Nursing Contact information: [place] [phone]                  Major procedures and Radiology Reports - PLEASE review detailed and final reports thoroughly  -        Dg Chest 2 View  Result Date: 12/15/2017 CLINICAL DATA:  84 year old female with ongoing shortness of breath, weakness and dry cough for 1 week. Recently diagnosed with pneumonia. EXAM: CHEST - 2 VIEW COMPARISON:  Chest x-ray 11/23/2017.  Chest CT 11/24/2017. FINDINGS: When compared to the prior chest x-ray from 11/23/2017, there continues to be widespread but patchy peripheral predominant areas of airspace consolidation and ground-glass opacities with some associated interstitial prominence. These appear slightly less confluent than the prior examination, particularly in the region of the right lower lobe, however, the overall distribution is very similar to the prior examination. Trace right pleural effusion. No definite left pleural effusion. No pneumothorax. No evidence of pulmonary edema. Heart size is normal. Upper mediastinal contours are within normal limits. Aortic atherosclerosis. IMPRESSION: 1. Minimal improvement in aeration compared to prior study from 11/23/2017. Findings are favored to reflect a resolving pneumonia, potentially with post inflammatory process such as evolving cryptogenic organizing pneumonia (COP). Based on the appearance of the prior chest CT, the possibility of multifocal neoplasm such as adenocarcinoma could also be considered, but is not strongly favored. As previously suggested, repeat chest CT (preferably high-resolution chest CT) is recommended in early February 2020 to reassess these findings. 2. Aortic  atherosclerosis.  Electronically Signed   By: Vinnie Langton M.D.   On: 12/15/2017 17:06   Dg Chest 2 View  Result Date: 11/23/2017 CLINICAL DATA:  Pneumonia.  Cough and shortness of breath. EXAM: CHEST - 2 VIEW COMPARISON:  November 16, 2017 FINDINGS: Patchy infiltrates in the lungs, right greater than left, similar on the right in the interval and mildly more pronounced on the left. Cardiomegaly. The hila and mediastinum are normal. No pneumothorax. No pulmonary nodules or masses. IMPRESSION: Persistent bilateral pulmonary infiltrates, similar on the right in the interval at and mildly more prominent on the left. Recommend treatment with short-term follow-up to ensure resolution. Electronically Signed   By: Dorise Bullion III M.D   On: 11/23/2017 15:43   Ct Chest Wo Contrast  Result Date: 12/16/2017 CLINICAL DATA:  Follow-up pneumonia. EXAM: CT CHEST WITHOUT CONTRAST TECHNIQUE: Multidetector CT imaging of the chest was performed following the standard protocol without IV contrast. COMPARISON:  CT chest 11/24/2017, 12/23/2016. Multiple prior chest x-rays, most recently yesterday. FINDINGS: Cardiovascular: Marked cardiomegaly. Severe aortic valvular calcification. Mild three-vessel coronary atherosclerosis. Approximate 5.1 cm ascending thoracic aortic aneurysm as noted on the prior examinations. Severe atherosclerosis involving the thoracic and proximal abdominal aorta. Enlarged pulmonary arteries, the RIGHT main pulmonary artery measuring up to 3.2 cm and the LEFT main pulmonary artery measuring up to 3.5 cm. Mediastinum/Nodes: No pathologically enlarged mediastinal, hilar or axillary lymph nodes. No mediastinal masses. Normal-appearing esophagus. Atrophic thyroid gland containing subcentimeter nodules. Lungs/Pleura: Emphysematous changes throughout both lungs. Since the examination 3 weeks ago, marked progression of the now confluent airspace opacities throughout both lungs, with greatest involvement in the [place] and RIGHT LOWER LOBE. The masslike opacity in the LEFT UPPER LOBE has also progressed in the interval. Small BILATERAL pleural effusions are present. Upper Abdomen: Small amount of perihepatic ascites. Visualized upper abdomen otherwise unremarkable for the unenhanced technique. Musculoskeletal: Exaggeration of the usual thoracic kyphosis. Diffuse degenerative disc disease and spondylosis throughout the thoracic spine. IMPRESSION: 1. Marked progression of pneumonia involving both lungs since the CT 3 weeks ago. The most confluent disease is in the RIGHT UPPER LOBE and RIGHT LOWER LOBE. 2. New small BILATERAL pleural effusions. 3. Marked cardiomegaly. Severe aortic valvular calcification. Mild three-vessel coronary atherosclerosis. 4. Ascending thoracic aortic aneurysm measuring up to 5.1 cm as noted previously. Recommendations for follow-up were given on the prior examination three weeks ago and include: Semi-annual imaging followup by CTA or MRA and referral to cardiothoracic surgery if not already obtained. This recommendation follows 2010 ACCF/AHA/AATS/ACR/ASA/SCA/SCAI/SIR/STS/SVM Guidelines for the Diagnosis and Management of Patients With Thoracic Aortic Disease. Circulation. 2010; 121: A193-X902. 5. Small amount of perihepatic ascites. Aortic Atherosclerosis (ICD10-I70.0) and Emphysema (ICD10-J43.9). Electronically Signed   By: Evangeline Dakin M.D.   On: 12/16/2017 19:54   Ct Chest Wo Contrast  Result Date: 11/24/2017 CLINICAL DATA:  Follow-up lung nodule.  No chest complaints. EXAM: CT CHEST WITHOUT CONTRAST TECHNIQUE: Multidetector CT imaging of the chest was performed following the standard protocol without IV contrast. COMPARISON:  12/23/2016 FINDINGS: Cardiovascular: The heart is enlarged. Coronary artery calcifications are present. There is calcification dilatation of the aortic root, measuring 5.1 centimeters (previously 4.5 centimeters. Ascending aorta is 5.1 centimeters, previously 5.0  centimeters. Aortic arch is 3.2 centimeters. Previously 3.1 centimeters. Distal arch is 3.2 centimeters, previously 3.0 centimeters. Descending thoracic aorta is 3.0 centimeters, stable. At the level of the diaphragmatic hiatus, the aorta is 2.8 centimeters, previously 2.6 centimeters. No evidence for displaced  intimal calcifications. Mediastinum/Nodes: There is high attenuation material within the contrast, consistent reflux or poor peristalsis. No obstructing mass is identified. No mediastinal, hilar, or axillary adenopathy. Lungs/Pleura: There are numerous ground-glass opacities throughout the lungs bilaterally. These areas are discrete in the UPPER lobes and confluent in the LOWER lobes. These ground-glass opacities obscure the nodules previously described and is difficult to assess for new pulmonary nodules given the presence of significant lung opacities which are likely inflammatory/infectious. No pleural effusion. Upper Abdomen: Stable appearance of low-attenuation lesion within the RIGHT kidney. Musculoskeletal: Midthoracic spondylosis. IMPRESSION: 1. Ascending aortic aneurysm. Ascending aorta now measures 5.1 centimeters, previously 5.0 centimeters. Aortic root is 5.1 centimeters, previously 4.5 centimeters. Ascending thoracic aortic aneurysm. Recommend semi-annual imaging followup by CTA or MRA and referral to cardiothoracic surgery if not already obtained. This recommendation follows 2010 ACCF/AHA/AATS/ACR/ASA/SCA/SCAI/SIR/STS/SVM Guidelines for the Diagnosis and Management of Patients With Thoracic Aortic Disease. Circulation. 2010; 121: X323-F573 2. New bilateral areas of ground-glass opacities and associated air bronchograms throughout the lungs bilaterally, confluent at the bases. Findings are consistent with infectious process. Follow-up CT of the chest is recommended in 3 months. 3. Debris within the esophagus consistent with poor peristalsis/obstruction, or reflux. 4. Cyst within the UPPER pole of  the RIGHT kidney, stable in appearance. Electronically Signed   By: Nolon Nations M.D.   On: 11/24/2017 09:33   Dg Esophagus  Result Date: 12/19/2017 CLINICAL DATA:  Aspiration pneumonia. EXAM: ESOPHOGRAM/BARIUM SWALLOW TECHNIQUE: Single contrast examination was performed using  thin barium. FLUOROSCOPY TIME:  Fluoroscopy Time:  2 minutes Radiation Exposure Index (if provided by the fluoroscopic device): Number of Acquired Spot Images: 0 COMPARISON:  CT 12/16/2017 FINDINGS: Fluoroscopic evaluation of swallowing demonstrates disruption of primary esophageal peristaltic waves and stasis of contrast. There is tapering of the distal esophagus with smooth long segment mild narrowing in the distal esophagus. No focal stricture. No fold thickening or mass. The 13 mm barium tablet sticks in the mid to distal esophagus. IMPRESSION: Slight generalized tapering and long segment narrowing of the distal esophagus. The 13 mm barium tablet sticks in this area. Esophageal dysmotility. Electronically Signed   By: Rolm Baptise M.D.   On: 12/19/2017 08:53   Dg Swallowing Func-speech Pathology  Result Date: 12/19/2017 Objective Swallowing Evaluation: Type of Study: MBS-Modified Barium Swallow Study  Patient Details Name: KENADY DOXTATER MRN: [id number] Date of Birth: 10/14/1926 Today's Date: 12/19/2017 Time: SLP Start Time (ACUTE ONLY): 0844 -SLP Stop Time (ACUTE ONLY): 0907 SLP Time Calculation (min) (ACUTE ONLY): 23 min Past Medical History: Past Medical History: Diagnosis Date . Acute respiratory failure with hypoxia ([place])  . AKI (acute kidney injury) ([place])  . Cancer ([place])  . Chronic anticoagulation 12/13/2012  Followed by Dr. Chapman Fitch . Dilated aortic root ([place]) 12/13/2012  4.5 cm 2013.  Marland Kitchen HCAP (healthcare-associated pneumonia)  . Hypertension  . Lung nodule seen on imaging study  . Mild aortic stenosis 12/13/2012 . TIA (transient ischemic attack)  Past Surgical History: Past Surgical History: Procedure Laterality Date . BREAST  LUMPECTOMY    bilateral . CESAREAN SECTION   . HIP SURGERY   . SKIN BIOPSY    From left foot. HPI: 84 y.o. female who presented to the [facility] on 12/15/2017 with persistent PNA. The patient was recently admitted 10/31-11/5 and treated for HCAP, D/Cd to a rehab facility; returned home but developed progressive SOB, fatigue. PMH includes A-fib on Coumadin, TIA, aortic stenosis, LBBB and melanoma surgically removed.  Subjective: pt alert, HOH Assessment /  Plan / Recommendation CHL IP CLINICAL IMPRESSIONS 12/19/2017 Clinical Impression Pt presents with what is likely a multifactorial dysphagia, with moderate oropharyngeal dysphagia observed on MBS and esophagram showing dysmotility and narrowing. Her oral phase is not well organized, with poor bolus cohesion, inefficient posterior transit, and premature spillage that reaches the pyriform sinuses with all liquid consistencies. Premature spillage also spills directly into the airway with all liquids tested. She primarily penetrates, but penetration goes as deep as to the vocal folds with pt not making any attempts to clear spontaneously, even when mild-moderate amounts of honey thick liquids enter the laryngeal vestibule, coating all the way down to the vocal folds. Thin liquids were aspirated with a weak cough triggered, but she needed cues for a more effortful volitional cough to clear penetrates and aspirates. Attempted a chin tuck, which worsened aspiration. Also attempted oral holding prior to swallow, with inconsistent results. The most effective strategies were a combination of holding the bolus and manipulating bolus size to small spoonfuls. Solids were better managed from an airway protection standpoint, but this is what she has the most subjective complaints about (perhaps related to her esophageal issues). Pt is likely having episodes of aspiration across the course of a meal from a prandial standpoint, but she may also be at risk for post-prandial aspiration.  Upon completion of the study and discussion of results, pt verbalized her desire for quality over quantity of life. Should this be her main priority, would consider allowing unrestricted PO intake, with implementation of strategies to reduce but not eliminate the risk of aspiration. Reached out to MD following testing - will await further delineation of [place] prior to starting any type of diet. SLP Visit Diagnosis Dysphagia, oropharyngeal phase (R13.12) Attention and concentration deficit following -- Frontal lobe and executive function deficit following -- Impact on safety and function Moderate aspiration risk   CHL IP TREATMENT RECOMMENDATION 12/19/2017 Treatment Recommendations Therapy as outlined in treatment plan below   Prognosis 12/19/2017 Prognosis for Safe Diet Advancement Fair Barriers to Reach Goals Time post onset;Severity of deficits Barriers/Prognosis Comment -- CHL IP DIET RECOMMENDATION 12/19/2017 SLP Diet Recommendations (No Data) Liquid Administration via -- Medication Administration Whole meds with puree Compensations -- Postural Changes --   CHL IP OTHER RECOMMENDATIONS 12/19/2017 Recommended Consults -- Oral Care Recommendations Oral care QID Other Recommendations --   CHL IP FOLLOW UP RECOMMENDATIONS 12/19/2017 Follow up Recommendations (No Data)   CHL IP FREQUENCY AND DURATION 12/19/2017 Speech Therapy Frequency (ACUTE ONLY) min 2x/week Treatment Duration 2 weeks      CHL IP ORAL PHASE 12/19/2017 Oral Phase Impaired Oral - Pudding Teaspoon -- Oral - Pudding Cup -- Oral - Honey Teaspoon -- Oral - Honey Cup Decreased bolus cohesion;Premature spillage Oral - Nectar Teaspoon -- Oral - Nectar Cup Decreased bolus cohesion;Premature spillage Oral - Nectar Straw -- Oral - Thin Teaspoon Decreased bolus cohesion;Premature spillage Oral - Thin Cup Decreased bolus cohesion;Premature spillage Oral - Thin Straw -- Oral - Puree Decreased bolus cohesion Oral - Mech Soft Decreased bolus cohesion Oral - Regular  -- Oral - Multi-Consistency -- Oral - Pill -- Oral Phase - Comment --  CHL IP PHARYNGEAL PHASE 12/19/2017 Pharyngeal Phase Impaired Pharyngeal- Pudding Teaspoon -- Pharyngeal -- Pharyngeal- Pudding Cup -- Pharyngeal -- Pharyngeal- Honey Teaspoon -- Pharyngeal -- Pharyngeal- Honey Cup Penetration/Aspiration before swallow;Pharyngeal residue - valleculae Pharyngeal Material enters airway, CONTACTS cords and not ejected out Pharyngeal- Nectar Teaspoon -- Pharyngeal -- Pharyngeal- Nectar Cup Pharyngeal residue - valleculae;Penetration/Aspiration before swallow  Pharyngeal Material enters airway, CONTACTS cords and not ejected out Pharyngeal- Nectar Straw -- Pharyngeal -- Pharyngeal- Thin Teaspoon Penetration/Aspiration before swallow Pharyngeal Material enters airway, remains ABOVE vocal cords and not ejected out Pharyngeal- Thin Cup Penetration/Aspiration before swallow Pharyngeal Material enters airway, passes BELOW cords then ejected out Pharyngeal- Thin Straw -- Pharyngeal -- Pharyngeal- Puree WFL Pharyngeal -- Pharyngeal- Mechanical Soft WFL Pharyngeal -- Pharyngeal- Regular -- Pharyngeal -- Pharyngeal- Multi-consistency -- Pharyngeal -- Pharyngeal- Pill -- Pharyngeal -- Pharyngeal Comment --  CHL IP CERVICAL ESOPHAGEAL PHASE 12/19/2017 Cervical Esophageal Phase WFL Pudding Teaspoon -- Pudding Cup -- Honey Teaspoon -- Honey Cup -- Nectar Teaspoon -- Nectar Cup -- Nectar Straw -- Thin Teaspoon -- Thin Cup -- Thin Straw -- Puree -- Mechanical Soft -- Regular -- Multi-consistency -- Pill -- Cervical Esophageal Comment -- Germain Osgood 12/19/2017, 10:48 AM  Germain Osgood, M.A. [facility] Pager [phone] Office [phone]              Micro Results    Recent Results (from the past 240 hour(s))  Blood culture (routine x 2)     Status: None   Collection Time: 12/15/17  7:21 PM  Result Value Ref Range Status   Specimen Description BLOOD LEFT ANTECUBITAL  Final   Special  Requests   Final    BOTTLES DRAWN AEROBIC AND ANAEROBIC Blood Culture adequate volume   Culture   Final    NO GROWTH 5 DAYS Performed at [facility], [address], [place], [place] [address]    Report Status 12/20/2017 FINAL  Final  Blood culture (routine x 2)     Status: None   Collection Time: 12/15/17  8:48 PM  Result Value Ref Range Status   Specimen Description BLOOD RIGHT ANTECUBITAL  Final   Special Requests   Final    BOTTLES DRAWN AEROBIC ONLY Blood Culture adequate volume   Culture   Final    NO GROWTH 5 DAYS Performed at [facility], [place] [address] [place], [place] [address]    Report Status 12/20/2017 FINAL  Final  Expectorated sputum assessment w rflx to resp cult     Status: None   Collection Time: 12/16/17  2:08 PM  Result Value Ref Range Status   Specimen Description EXPECTORATED SPUTUM  Final   Special Requests NONE  Final   Sputum evaluation   Final    Sputum specimen not acceptable for testing.  Please recollect.   RESULT CALLED TO, READ BACK BY AND VERIFIED WITH: RN Ranelle Oyster 1921 [phone] FCP Performed at [facility], [place] [address] [place], [place] [address]    Report Status 12/16/2017 FINAL  Final  Respiratory Panel by PCR     Status: Abnormal   Collection Time: 12/17/17  2:34 PM  Result Value Ref Range Status   Adenovirus NOT DETECTED NOT DETECTED Final   Coronavirus 229E NOT DETECTED NOT DETECTED Final   Coronavirus HKU1 NOT DETECTED NOT DETECTED Final   Coronavirus NL63 NOT DETECTED NOT DETECTED Final   Coronavirus OC43 NOT DETECTED NOT DETECTED Final   Metapneumovirus NOT DETECTED NOT DETECTED Final   Rhinovirus / Enterovirus NOT DETECTED NOT DETECTED Final   Influenza A NOT DETECTED NOT DETECTED Final   Influenza B NOT DETECTED NOT DETECTED Final   Parainfluenza Virus 1 DETECTED (A) NOT DETECTED Final   Parainfluenza Virus 2 NOT DETECTED NOT DETECTED Final   Parainfluenza Virus 3 NOT DETECTED NOT DETECTED Final  Parainfluenza Virus 4 NOT DETECTED NOT DETECTED Final   Respiratory Syncytial Virus NOT DETECTED NOT DETECTED Final   Bordetella pertussis NOT DETECTED NOT DETECTED Final   Chlamydophila pneumoniae NOT DETECTED NOT DETECTED Final   Mycoplasma pneumoniae NOT DETECTED NOT DETECTED Final    Comment: Performed at [facility], [place] [address] [place], [place] [address]    Today   Subjective    Julie Mosley today has no headache,no chest abdominal pain,no new weakness tingling or numbness, feels much better wants to go home today.    Objective   Blood pressure 120/70, pulse (!) 58, temperature 97.8 F (36.6 C), temperature source Oral, resp. rate 20, height 5\' 7"  (1.702 m), weight 55.7 kg, SpO2 96 %.   Intake/Output Summary (Last 24 hours) at 12/20/2017 1032 Last data filed at 12/20/2017 0635 Gross per 24 hour  Intake 120 ml  Output 200 ml  Net -80 ml    Exam Awake Alert, Oriented x 3, No new F.N deficits, Normal affect [place].AT,PERRAL Supple Neck,No JVD, No cervical lymphadenopathy appriciated.  Symmetrical Chest wall movement, Good air movement bilaterally, Coarse R sided rales RRR,No Gallops,Rubs or new Murmurs, No Parasternal Heave +ve B.Sounds, Abd Soft, Non tender, No organomegaly appriciated, No rebound -guarding or rigidity. No Cyanosis, Clubbing or edema, No new Rash or bruise   Data Review   CBC w Diff:  Lab Results  Component Value Date   WBC 8.6 12/20/2017   HGB 13.3 12/20/2017   HGB 14.0 11/21/2016   HCT 42.7 12/20/2017   HCT 42.1 11/21/2016   PLT 126 (L) 12/20/2017   PLT 134 (L) 11/21/2016   LYMPHOPCT 14 12/15/2017   MONOPCT 9 12/15/2017   EOSPCT 0 12/15/2017   BASOPCT 0 12/15/2017    CMP:  Lab Results  Component Value Date   NA 137 12/20/2017   NA 144 11/21/2016   K 4.9 12/20/2017   CL 105 12/20/2017   CO2 28 12/20/2017   BUN 37 (H) 12/20/2017   BUN 19 11/21/2016   CREATININE 0.98 12/20/2017   PROT 6.1 (L) 12/15/2017   PROT 6.4  11/21/2016   ALBUMIN 3.3 (L) 12/15/2017   ALBUMIN 4.1 11/21/2016   BILITOT 0.9 12/15/2017   BILITOT 0.5 11/21/2016   ALKPHOS 84 12/15/2017   AST 48 (H) 12/15/2017   ALT 21 12/15/2017  .   Total Time in preparing paper work, data evaluation and todays exam - 47 minutes  Lala Lund M.D on 12/20/2017 at 10:32 AM  [facility]   Office  [phone]

## 2017-12-20 NOTE — Progress Notes (Signed)
Occupational Therapy Treatment Patient Details Name: Julie Mosley MRN: [id number] DOB: 04/05/1926 Today's Date: 12/20/2017    History of present illness 84 y.o. female who presented to the [facility] on 12/15/2017 with persistent PNA. The patient was recently admitted 10/31-11/5 and treated for HCAP, D/Cd to a rehab facility; returned home but developed progressive SOB, fatigue. PMH includes A-fib on Coumadin, TIA, aortic stenosis, LBBB and melanoma surgically removed.    OT comments  Pt making great progress toward her bathing/dressing goals. Pt able to complete all UB/LB b/d with no physical assist, occasional cueing fr safety awareness and body mechanics to reduce fall risk. Discussion re d/c planning and importance of maintaining activity levels on bone density and cardiovascular health. SNF remains appropriate d/c unless family willing to provide care.   Follow Up Recommendations  SNF    Equipment Recommendations  Other (comment)(defer to next venue)    Recommendations for Other Services PT consult    Precautions / Restrictions Precautions Precautions: Fall Precaution Comments: Watch SpO2, urinary frequency Restrictions Weight Bearing Restrictions: No       Mobility Bed Mobility               General bed mobility comments: Pt OOB upon arrival  Transfers Overall transfer level: Needs assistance Equipment used: None Transfers: Sit to/from Stand Sit to Stand: Supervision Stand pivot transfers: Min guard       General transfer comment: Cueing for body mechanics only, no physical assist    Balance Overall balance assessment: Mild deficits observed, not formally tested Sitting-balance support: No upper extremity supported;Feet supported Sitting balance-Leahy Scale: Good     Standing balance support: No upper extremity supported;During functional activity Standing balance-Leahy Scale: Fair Standing balance comment: patient able to stand, pull up underwear, and  perform self pericare.                           ADL either performed or assessed with clinical judgement   ADL Overall ADL's : Needs assistance/impaired Eating/Feeding: Modified independent;Sitting   Grooming: Oral care;Supervision/safety   Upper Body Bathing: Set up;Sitting   Lower Body Bathing: Supervison/ safety;Sit to/from stand   Upper Body Dressing : Modified independent;Sitting   Lower Body Dressing: Supervision/safety;Sit to/from stand                 General ADL Comments: No SOB or drop in SpO2 during dressing               Cognition Arousal/Alertness: Awake/alert Behavior During Therapy: WFL for tasks assessed/performed Overall Cognitive Status: Within Functional Limits for tasks assessed                                                     Pertinent Vitals/ Pain       Pain Assessment: No/denies pain Faces Pain Scale: Hurts a little bit Pain Location: tailbone Pain Descriptors / Indicators: Discomfort Pain Intervention(s): Limited activity within patient's tolerance;Monitored during session     Prior Functioning/Environment              Frequency  Min 2X/week        Progress Toward Goals  OT Goals(current goals can now be found in the care plan section)  Progress towards OT goals: Progressing toward goals  Acute Rehab OT Goals Patient Stated  Goal: none stated OT Goal Formulation: With patient/family Time For Goal Achievement: 12/30/17 Potential to Achieve Goals: Good  Plan Discharge plan remains appropriate       AM-PAC OT "6 Clicks" Daily Activity     Outcome Measure   Help from another person eating meals?: None Help from another person taking care of personal grooming?: None Help from another person toileting, which includes using toliet, bedpan, or urinal?: A Little Help from another person bathing (including washing, rinsing, drying)?: A Little Help from another person to put on and taking  off regular upper body clothing?: None Help from another person to put on and taking off regular lower body clothing?: A Little 6 Click Score: 21    End of Session Equipment Utilized During Treatment: Oxygen  OT Visit Diagnosis: Unsteadiness on feet (R26.81);Other abnormalities of gait and mobility (R26.89);Muscle weakness (generalized) (M62.81);Pain   Activity Tolerance Patient tolerated treatment well   Patient Left in chair;with call bell/phone within reach;with family/visitor present;with chair alarm set   Nurse Communication Mobility status        Time: 5997-7414 OT Time Calculation (min): 34 min  Charges: OT General Charges $OT Visit: 1 Visit OT Treatments $Self Care/Home Management : 23-37 mins   Curtis Sites OTR/L 12/20/2017, 10:46 AM

## 2017-12-20 NOTE — Progress Notes (Addendum)
Patient will DC to: [facility] Anticipated DC date: 12/20/17 Family notified: Son, [name] Transport by: Corey Harold   Per MD patient ready for DC to [facility]. RN, patient, patient's family, and facility notified of DC. Discharge Summary and FL2 sent to facility. RN to call report prior to discharge [phone] Room 201). DC packet on chart. Ambulance transport requested for patient.   CSW will sign off for now as social work intervention is no longer needed. Please consult [place] again if new needs arise.  Cedric Fishman, LCSW Clinical Social Worker [phone]

## 2017-12-20 NOTE — Care Management Important Message (Signed)
Important Message  Patient Details  Name: Julie Mosley MRN: [id number] Date of Birth: 1926-02-06   Medicare Important Message Given:  Yes    Orbie Pyo 12/20/2017, 2:32 PM

## 2017-12-20 NOTE — Progress Notes (Signed)
ANTICOAGULATION CONSULT NOTE - Follow Up Consult  Pharmacy Consult for Coumadin Indication: atrial fibrillation  Allergies  Allergen Reactions  . Valium [Diazepam] Other (See Comments)    Stopped breathing    Patient Measurements: Height: 5\' 7"  (170.2 cm) Weight: 122 lb 12.7 oz (55.7 kg) IBW/kg (Calculated) : 61.6  Vital Signs: Temp: 97.8 F (36.6 C) (11/27 0420) Temp Source: Oral (11/27 0420) BP: 120/70 (11/27 0420) Pulse Rate: 58 (11/27 0420)  Labs: Recent Labs    12/18/17 0505 12/19/17 0353 12/20/17 0406  HGB 13.4 13.1 13.3  HCT 42.3 42.2 42.7  PLT 100* 112* 126*  LABPROT 37.6* 43.5* 45.1*  INR 3.90 4.71* 4.92*  CREATININE  --  1.00 0.98  CKTOTAL 27*  --   --   CKMB 3.4  --   --     Estimated Creatinine Clearance: 34.2 mL/min (by C-G formula based on SCr of 0.98 mg/dL).  Assessment:  Anticoag: Warfarin PTA for hx Afib.  INR  1.65>2.06>3.9>4.71>4.92 starting to plateau, Hgb 13.3 and PLT 126 (ranged 93-134 over past month).  No bleeding reported.  Vit K 1mg  po x 1 - PTA Warf dose 3 mg/day  Goal of Therapy:  INR 2-3 Monitor platelets by anticoagulation protocol: Yes   Plan:  - Hold Coumadin again tonight - Monitor daily INR and s/sx of bleed - MD ordered Vit K 1mg  po x 1 - Plan discharge 11/27  Jassmin Kemmerer S. Alford Highland, PharmD, [facility] Clinical Staff Pharmacist [place], [place] 12/20/2017,9:51 AM

## 2017-12-20 NOTE — Clinical Social Work Placement (Signed)
   CLINICAL SOCIAL WORK PLACEMENT  NOTE  Date:  12/20/2017  Patient Details  Name: Julie Mosley MRN: [id number] Date of Birth: [birth year]  Clinical Social Work is seeking post-discharge placement for this patient at the [place] level of care (*CSW will initial, date and re-position this form in  chart as items are completed):  Yes   Patient/family provided with [facility] Work Department's list of facilities offering this level of care within the geographic area requested by the patient (or if unable, by the patient's family).  Yes   Patient/family informed of their freedom to choose among providers that offer the needed level of care, that participate in Medicare, Medicaid or managed care program needed by the patient, have an available bed and are willing to accept the patient.  Yes   Patient/family informed of [facility]'s ownership interest in [facility] and [facility], as well as of the fact that they are under no obligation to receive care at these facilities.  PASRR submitted to EDS on       PASRR number received on       Existing PASRR number confirmed on 12/20/17     FL2 transmitted to all facilities in geographic area requested by pt/family on 12/20/17     FL2 transmitted to all facilities within larger geographic area on       Patient informed that his/her managed care company has contracts with or will negotiate with certain facilities, including the following:        Yes   Patient/family informed of bed offers received.  Patient chooses bed at [facility]     Physician recommends and patient chooses bed at      Patient to be transferred to [facility] on 12/20/17.  Patient to be transferred to facility by PTAR     Patient family notified on 12/20/17 of transfer.  Name of family member notified:  Pt and [name], son     PHYSICIAN       Additional Comment:     _______________________________________________ Benard Halsted, LCSW 12/20/2017, 11:08 AM

## 2017-12-20 NOTE — Consult Note (Signed)
Consultation Note Date: 12/20/2017   Patient Name: Julie Mosley  DOB: July 19, 1926  MRN: [id number]  Age / Sex: 84 y.o., female  PCP: ViaLennette Bihari, MD Referring Physician: No att. providers found  Reason for Consultation: Establishing goals of care  HPI/Patient Profile: 84 y.o. female  with past medical history of TIA, aortic stenosis, lung nodule, HTN, HCAP, AKI, afib on coumadin admitted on 12/15/2017 with shortness of breath, fatigue, and cough. Home health staff checked her oxygen saturation prior to admission and oxygen was 74%. Patient admitted for right-sided pulmonary infiltrates recurrent and progressive with hypoxia. SLP evaluated and MBS revealed oropharyngeal phase of ongoing silent aspiration. Per attending, patient/family wish for quality of life/comfort feeds. Palliative medicine consultation for goals of care.   Clinical Assessment and Goals of Care:  I have reviewed medical records, discussed with care team, and met with patient and her son Julie Mosley) at bedside to discuss diagnosis, [place], EOL wishes, disposition and options.  Patient is awake, alert, oriented. She is sitting in recliner with no acute distress or discomfort. She is dressed and tells me she will discharge back to rehab today.   I introduced Palliative Medicine as specialized medical care for people living with serious illness. It focuses on providing relief from the symptoms and stress of a serious illness. The goal is to improve quality of life for both the patient and the family.  We discussed a brief life review of the patient. Husband passed away 13 years ago yesterday. She has two supportive sons. Prior to admission in late October for pneumonia, patient was living home alone and independently performing ADL's. Julie Mosley does share that the last few months, she has been less mobile due to surgical resection of left ankle skin cancer requiring  wound care, rest, and elevation. Prior to this, the patient was still able to work in her garden.   Discussed events leading up to hospitalization and course of hospital diagnoses and interventions. Son recalled their conversation with Dr. Candiss Norse yesterday and patient/family understanding of high risk for recurrent aspiration contributing to ongoing respiratory issues/decline.   I attempted to elicit values and goals of care important to the patient and family. Patient speaks of living a good life and being blessed to still be alive at the age of 64. She shares that if she does not have quality of life, she does not wish to prolong the end of her life. She confirms her decision for quality of life over quantity. She does share that she wishes to be home when she is nearing the end of life.   Advanced directives, concepts specific to code status, artifical feeding and hydration, and rehospitalization were considered and discussed. The patient's living will and desire for natural death was reviewed in epic. She confirms her decision against heroic measures at EOL. I did introduce and discuss MOST form. We discussed comfort feeds understanding risk for ongoing aspiration.   Julie Mosley speaks of plan for discharge today back to [facility] today. We discussed outpatient palliative follow-up with  eventual transition to hospice services when patient/family ready. We did discuss hospice philosophy.  Questions and concerns were addressed.  Hard Choices booklet left for review. PMT contact information given.    SUMMARY OF RECOMMENDATIONS     DNR/DNI. Continue medical management.   Patient confirms quality of life over quantity. She agrees with comfort feeds, understanding risk for ongoing aspiration.   Discharge today to SNF for ongoing rehab.   Patient may benefit from outpatient palliative f/u for further [place] discussions. Introduced hospice philosophy and options.   Code Status/Advance Care  Planning:  DNR  Symptom Management:   Per attending  Palliative Prophylaxis:   Aspiration, Delirium Protocol, Oral Care and Turn Reposition  Psycho-social/Spiritual:   Desire for further Chaplaincy support:yes  Additional Recommendations: Caregiving  Support/Resources, Compassionate Wean Education and Education on Hospice  Prognosis:   Guarded with acute respiratory failure with hypoxia due to silent aspiration. High risk for continued aspiration and decline.   Discharge Planning: [facility] for rehab with Palliative care service follow-up      Primary Diagnoses: Present on Admission: . Hypertension . A-fib ([place]) . Open wound of left ankle . AKI (acute kidney injury) ([place]) . Postinflammatory pulmonary fibrosis ([place]) . Hypokalemia . Pulmonary nodule less than 6 cm determined by computed tomography of lung . Lung nodule seen on imaging study . Esophageal reflux   I have reviewed the medical record, interviewed the patient and family, and examined the patient. The following aspects are pertinent.  Past Medical History:  Diagnosis Date  . Acute respiratory failure with hypoxia ([place])   . AKI (acute kidney injury) ([place])   . Cancer ([place])   . Chronic anticoagulation 12/13/2012   Followed by Dr. Chapman Fitch  . Dilated aortic root ([place]) 12/13/2012   4.5 cm 2013.   [place] HCAP (healthcare-associated pneumonia)   . Hypertension   . Lung nodule seen on imaging study   . Mild aortic stenosis 12/13/2012  . TIA (transient ischemic attack)    Social History   Socioeconomic History  . Marital status: Married    Spouse name: Not on file  . Number of children: Not on file  . Years of education: Not on file  . Highest education level: Not on file  Occupational History  . Not on file  Social Needs  . Financial resource strain: Not on file  . Food insecurity:    Worry: Not on file    Inability: Not on file  . Transportation needs:    Medical: Not on file     Non-medical: Not on file  Tobacco Use  . Smoking status: Former Smoker    Last attempt to quit: 04/03/1975    Years since quitting: 42.7  . Smokeless tobacco: Never Used  Substance and Sexual Activity  . Alcohol use: No  . Drug use: No  . Sexual activity: Never  Lifestyle  . Physical activity:    Days per week: Not on file    Minutes per session: Not on file  . Stress: Not on file  Relationships  . Social connections:    Talks on phone: Not on file    Gets together: Not on file    Attends religious service: Not on file    Active member of club or organization: Not on file    Attends meetings of clubs or organizations: Not on file    Relationship status: Not on file  Other Topics Concern  . Not on file  Social History Narrative  .  Not on file   Family History  Problem Relation Age of Onset  . Heart attack Mother   . Heart attack Sister   . Heart attack Brother    Scheduled Meds: . guaiFENesin  600 mg Oral BID  . metoCLOPramide  5 mg Oral TID AC & HS  . pantoprazole  40 mg Oral BID  . Warfarin - Pharmacist Dosing Inpatient   Does not apply q1800   Continuous Infusions: PRN Meds:.acetaminophen Medications Prior to Admission:  Prior to Admission medications   Medication Sig Start Date End Date Taking? Authorizing Provider  acetaminophen (TYLENOL) 650 MG CR tablet Take 650 mg by mouth every 8 (eight) hours as needed for pain.   Yes [provider]  amLODipine (NORVASC) 5 MG tablet TAKE 1 TABLET BY MOUTH  DAILY Patient taking differently: Take 5 mg by mouth daily.  01/23/17  Yes Jerline Pain, MD  Calcium Carbonate-Vitamin D (CALCIUM 600+D) 600-200 MG-UNIT TABS Take 1 tablet by mouth daily.   Yes [provider]  hydrochlorothiazide (HYDRODIURIL) 25 MG tablet TAKE ONE TABLET BY MOUTH ONCE DAILY Patient taking differently: Take 25 mg by mouth daily.  03/01/16  Yes Jerline Pain, MD  lisinopril (PRINIVIL,ZESTRIL) 20 MG tablet TAKE 1 TABLET BY MOUTH TWO   TIMES DAILY Patient taking differently: Take 20 mg by mouth 2 (two) times daily.  10/30/17  Yes Jerline Pain, MD  Multiple Vitamins-Minerals (CENTRUM SILVER PO) Take 1 tablet by mouth daily.    Yes [provider]  Omega-3 Fatty Acids (FISH OIL) 1000 MG CAPS Take 1 capsule by mouth daily.   Yes [provider]  pantoprazole (PROTONIX) 40 MG tablet Take 1 tablet (40 mg total) by mouth daily. 12/20/17   Thurnell Lose, MD  predniSONE (DELTASONE) 5 MG tablet Take 8 Pills PO for 3 days, 6 Pills PO for 3 days, 4 Pills PO for 3 days, 2 Pills PO for 3 days, 1 Pills PO for 3 days, 1/2 Pill  PO for 3 days then STOP. 12/20/17   Thurnell Lose, MD  warfarin (COUMADIN) 3 MG tablet Take 1 tablet (3 mg total) by mouth daily. SNF MD to monitor INR daily for the next 3 days and adjust Coumadin dose as needed 12/22/17   Thurnell Lose, MD   Allergies  Allergen Reactions  . Valium [Diazepam] Other (See Comments)    Stopped breathing   Review of Systems  Constitutional: Positive for activity change and appetite change.  Respiratory: Positive for shortness of breath.   Neurological: Positive for weakness.   Physical Exam  Constitutional: She is oriented to person, place, and time. She is cooperative.  HENT:  Head: Normocephalic and atraumatic.  Pulmonary/Chest: No accessory muscle usage. No tachypnea. No respiratory distress.  Neurological: She is alert and oriented to person, place, and time.  Skin: Skin is warm and dry. There is pallor.  Psychiatric: She has a normal mood and affect. Her speech is normal and behavior is normal. Cognition and memory are normal.  Nursing note and vitals reviewed.   Vital Signs: BP 120/70 (BP Location: Right Arm)   Pulse (!) 58   Temp 97.8 F (36.6 C) (Oral)   Resp 20   Ht _0  (1.702 m)   Wt 55.7 kg   LMP  (LMP Unknown)   SpO2 96%   BMI 19.23 kg/m  Pain Scale: 0-10   Pain Score: 0-No pain   SpO2: SpO2: 96 % O2 Device:SpO2: 96  %  O2 Flow Rate: .O2 Flow Rate (L/min): 2 L/min  IO: Intake/output summary:   Intake/Output Summary (Last 24 hours) at 12/20/2017 1238 Last data filed at 12/20/2017 1660 Gross per 24 hour  Intake 120 ml  Output 200 ml  Net -80 ml    LBM: Last BM Date: 12/19/17 Baseline Weight: Weight: 60 kg Most recent weight: Weight: 55.7 kg     Palliative Assessment/Data: PPS 60%   Flowsheet Rows     Most Recent Value  Intake Tab  Referral Department  Hospitalist  Unit at Time of Referral  Med/Surg Unit  Palliative Care Primary Diagnosis  Sepsis/Infectious Disease  Date Notified  12/19/17  Palliative Care Type  New Palliative care  Reason for referral  Clarify Goals of Care  Date of Admission  12/15/17  Date first seen by Palliative Care  12/20/17  # of days IP prior to Palliative referral  4  Clinical Assessment  Palliative Performance Scale Score  60%  Psychosocial & Spiritual Assessment  Palliative Care Outcomes  Patient/Family meeting held?  Yes  Who was at the meeting?  patient and son  Palliative Care Outcomes  Clarified goals of care, Counseled regarding hospice, Provided psychosocial or spiritual support, ACP counseling assistance, Provided end of life care assistance, Linked to palliative care logitudinal support      Time In: 6004 Time Out: 1105 Time Total: 28mn Greater than 50%  of this time was spent counseling and coordinating care related to the above assessment and plan.  Signed by:  MIhor Dow FNP-C Palliative Medicine Team  Phone: [phone]: [phone]  Please contact Palliative Medicine Team phone at [phone] questions and concerns.  For individual provider: See AShea Evans

## 2017-12-20 NOTE — Progress Notes (Signed)
Pt given discharge instructions, prescriptions, and care notes. Pt verbalized understanding AEB no further questions or concerns at this time. IV was discontinued, no redness, pain, or swelling noted at this time.Pt left the floor via PTAR  with staff in stable condition.  [place] notify of pt discharged instructions were given to  LPN [place].

## 2017-12-20 NOTE — Progress Notes (Signed)
CRITICAL VALUE ALERT  Critical Value:  INR 4.92  Date & Time Notied:  12/20/2017 at [place]  Provider Notified: K.schorr  Orders Received/Actions taken: no order this time will continue the patient.

## 2017-12-25 ENCOUNTER — Inpatient Hospital Stay: Admission: RE | Admit: 2017-12-25 | Payer: Medicare Other | Source: Ambulatory Visit

## 2018-01-01 ENCOUNTER — Other Ambulatory Visit: Payer: Self-pay | Admitting: Cardiology

## 2018-01-01 ENCOUNTER — Encounter: Payer: Self-pay | Admitting: Adult Health

## 2018-01-01 ENCOUNTER — Ambulatory Visit: Payer: Medicare Other | Admitting: Adult Health

## 2018-01-01 VITALS — BP 118/64 | HR 84 | Ht 67.0 in | Wt 111.0 lb

## 2018-01-01 DIAGNOSIS — J181 Lobar pneumonia, unspecified organism: Secondary | ICD-10-CM | POA: Diagnosis not present

## 2018-01-01 DIAGNOSIS — J9611 Chronic respiratory failure with hypoxia: Secondary | ICD-10-CM

## 2018-01-01 DIAGNOSIS — R059 Cough, unspecified: Secondary | ICD-10-CM | POA: Insufficient documentation

## 2018-01-01 DIAGNOSIS — R05 Cough: Secondary | ICD-10-CM | POA: Diagnosis not present

## 2018-01-01 DIAGNOSIS — I10 Essential (primary) hypertension: Secondary | ICD-10-CM

## 2018-01-01 DIAGNOSIS — R918 Other nonspecific abnormal finding of lung field: Secondary | ICD-10-CM | POA: Diagnosis not present

## 2018-01-01 DIAGNOSIS — J189 Pneumonia, unspecified organism: Secondary | ICD-10-CM

## 2018-01-01 NOTE — Assessment & Plan Note (Signed)
Progressive pulmonary infiltrates bilaterally right greater than left with possible underlying Boop/COP versus aspiration pneumonitis.  Patient had silent aspiration noted on modified barium swallow.  She had significant clinical improvement with steroids. O2 demands are decreasing. Check chest x-ray today.  Decrease oxygen to 2 L to keep O2 saturation greater than 90%.  Continue on slow prednisone taper.  She is currently on 5 mg. Point forward will need a follow-up CT chest in 6 to 8 weeks.  If able can consider spirometry on return Patient is on an ACE inhibitor with persistent cough.  Suspect this is aggravating her cough this seems to be worse at night.  We will try to change her ACE inhibitor over to ARB alternative until she can get in with her primary care physician as I do not want her having significant coughing episodes throughout the night with her aspiration history Plan  Patient Instructions  Check Chest xray today .  Stop Lisinopril as it may be aggravating your cough .  Begin Diovan 80mg  daily .  You will need to follow up with Primary MD for further blood pressure issues.  Decrease Oxygen 2l/m .  Aspiration precautions as discussed.  Taper off Prednisone as directed.  Follow up with Dr. Chase Caller in 2 weeks and As needed  (or Deondrick Searls NP )  Please contact office for sooner follow up if symptoms do not improve or worsen or seek emergency care

## 2018-01-01 NOTE — Assessment & Plan Note (Signed)
O2 demands are decreasing.  Adjust oxygen to 2 L.

## 2018-01-01 NOTE — Patient Instructions (Addendum)
Check Chest xray today .  Stop Lisinopril as it may be aggravating your cough .  Begin Diovan 80mg  daily .  You will need to follow up with Primary MD for further blood pressure issues.  Decrease Oxygen 2l/m .  Aspiration precautions as discussed.  Taper off Prednisone as directed.  Follow up with Dr. Chase Caller in 2 weeks and As needed  (or Aidin Doane NP )  Please contact office for sooner follow up if symptoms do not improve or worsen or seek emergency care

## 2018-01-01 NOTE — Assessment & Plan Note (Signed)
Chronic cough-questionable aggravated by ACE inhibitor.  We will change her ACE to ARB .   Plan  Patient Instructions  Check Chest xray today .  Stop Lisinopril as it may be aggravating your cough .  Begin Diovan 80mg  daily .  You will need to follow up with Primary MD for further blood pressure issues.  Decrease Oxygen 2l/m .  Aspiration precautions as discussed.  Taper off Prednisone as directed.  Follow up with Dr. Chase Caller in 2 weeks and As needed  (or Starlene Consuegra NP )  Please contact office for sooner follow up if symptoms do not improve or worsen or seek emergency care

## 2018-01-01 NOTE — Progress Notes (Signed)
@Patient  ID: Julie Mosley, female    DOB: 12-20-1926, 84 y.o.   MRN: [id number]  Chief Complaint  Patient presents with  . Follow-up    Referring provider: ViaLennette Bihari, MD  HPI: 84 year old female former smoker quit in 1980 with minimum smoking exposure seen for pulmonary consult during hospitalization December 17, 2017 for progressive pulmonary infiltrates and hypoxemia Medical history significant for A. 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  Patient was seen during her recent hospitalization in November for a pulmonary consult for progressive pulmonary infiltrates and hypoxemia.  Patient was initially admitted October 31 for presumed community-acquired pneumonia.  CT chest showed new bilateral ground glass opacities and associated air bronchograms.  She was treated with antibiotics and discharged on doxycycline.  She was discharged to a rehab center due to deconditioning.  Patient then was discharged home.  However once getting home patient became weaker.  Home health found her to have low O2 saturations around 74%.  Patient was readmitted November 23 for progressive pneumonia.  CT chest showed emphysema.  Progressive confluent airspace opacities throughout both lungs right upper and right lower greater than left.  And a masslike opacity in the left upper lobe.  Patient was treated initially with IV antibiotics.  Autoimmune and connective tissue work-up was negative and unrevealing. Patient was felt to have a possible underlying Boop versus  aspiration pneumonitis.  Modified barium swallow showed oropharyngeal phase of ongoing silent aspiration.  Patient was started on steroids and had slow clinical improvement.  Patient has been discharged to skilled nursing facility is undergoing physical therapy.  Patient says she is making slow improvement.  She does get winded with heavy activity.  She was discharged on oxygen 4 L.  Today in the office at rest on room air.  O2 saturations greater than 90%.  Walk test in the office showed O2 saturations at 87% walking on room air.  On 2 L of oxygen patient was able to keep her O2 saturations greater than 90%. Patient says she is eating.  She is on aspiration precautions.  Patient does complain that she continues to have a very dry cough that is throughout the day and worse at night.  Patient says she coughs all night long.  Of note patient is on ACE inhibitor. Patient is accompanied by multiple members of her family..  She denies any hemoptysis chest pain orthopnea PND or increased leg swelling.   Allergies  Allergen Reactions  . Valium [Diazepam] Other (See Comments)    Stopped breathing    Immunization History  Administered Date(s) Administered  . Pneumococcal Conjugate-13 01/02/2016    Past Medical History:  Diagnosis Date  . Acute respiratory failure with hypoxia ([place])   . AKI (acute kidney injury) ([place])   . Cancer ([place])   . Chronic anticoagulation 12/13/2012   Followed by Dr. Chapman Fitch  . Dilated aortic root ([place]) 12/13/2012   4.5 cm 2013.   Marland Kitchen HCAP (healthcare-associated pneumonia)   . Hypertension   . Lung nodule seen on imaging study   . Mild aortic stenosis 12/13/2012  .  TIA (transient ischemic attack)     Tobacco History: Social History   Tobacco Use  Smoking Status Former Smoker  . Last attempt to quit: 04/02/1968  . Years since quitting: 49.7  Smokeless Tobacco Never Used   Counseling given: Not Answered   Outpatient Medications Prior to Visit  Medication Sig  Dispense Refill  . acetaminophen (TYLENOL) 650 MG CR tablet Take 650 mg by mouth every 8 (eight) hours as needed for pain.    Marland Kitchen amLODipine (NORVASC) 5 MG tablet TAKE 1 TABLET BY MOUTH  DAILY (Patient taking differently: Take 5 mg by mouth daily. ) 90 tablet 3  . Calcium Carbonate-Vitamin D (CALCIUM 600+D) 600-200 MG-UNIT TABS Take 1 tablet by mouth daily.    . hydrochlorothiazide (HYDRODIURIL) 25 MG tablet TAKE ONE TABLET BY MOUTH ONCE DAILY (Patient taking differently: Take 25 mg by mouth daily. ) 90 tablet 2  . Multiple Vitamins-Minerals (CENTRUM SILVER PO) Take 1 tablet by mouth daily.     . Omega-3 Fatty Acids (FISH OIL) 1000 MG CAPS Take 1 capsule by mouth daily.    . pantoprazole (PROTONIX) 40 MG tablet Take 1 tablet (40 mg total) by mouth daily.    . predniSONE (DELTASONE) 5 MG tablet Take 8 Pills PO for 3 days, 6 Pills PO for 3 days, 4 Pills PO for 3 days, 2 Pills PO for 3 days, 1 Pills PO for 3 days, 1/2 Pill  PO for 3 days then STOP. 95 tablet 0  . valsartan (DIOVAN) 80 MG tablet Take 80 mg by mouth daily.    Marland Kitchen warfarin (COUMADIN) 3 MG tablet Take 1 tablet (3 mg total) by mouth daily. SNF MD to monitor INR daily for the next 3 days and adjust Coumadin dose as needed    . lisinopril (PRINIVIL,ZESTRIL) 20 MG tablet TAKE 1 TABLET BY MOUTH TWO  TIMES DAILY (Patient not taking: No sig reported) 180 tablet 2   No facility-administered medications prior to visit.      Review of Systems  Constitutional:   No  weight loss, night sweats,  Fevers, chills,  +fatigue, or  lassitude.  HEENT:   No headaches,  Difficulty swallowing,  Tooth/dental problems, or  Sore throat,                No sneezing, itching, ear ache, nasal congestion, post nasal drip,   CV:  No chest pain,  Orthopnea, PND, swelling in lower extremities, anasarca, dizziness, palpitations, syncope.   GI  No heartburn, indigestion, abdominal pain, nausea, vomiting, diarrhea, change in bowel habits, loss of appetite, bloody  stools.   Resp:  No chest wall deformity  Skin: no rash or lesions.  GU: no dysuria, change in color of urine, no urgency or frequency.  No flank pain, no hematuria   MS:  No joint pain or swelling.  No decreased range of motion.  No back pain.    Physical Exam  BP 118/64 (BP Location: Left Arm, Cuff Size: Normal)   Pulse 84   Ht 5\' 7"  (1.702 m)   Wt 111 lb (50.3 kg)   LMP  (LMP Unknown)   SpO2 96%   BMI 17.39 kg/m   GEN: A/Ox3; pleasant , NAD, frail elderly female on oxygen in wheelchair   HEENT:  /AT,  EACs-clear, TMs-wnl, NOSE-clear, THROAT-clear, no lesions, no postnasal drip or exudate noted.   NECK:  Supple w/ fair ROM; no JVD; normal carotid impulses w/o bruits; no thyromegaly or nodules palpated; no lymphadenopathy.  RESP few trace rhonchi . no accessory muscle use, no dullness to percussion  CARD:  RRR, no m/r/g, no peripheral edema, pulses intact, no cyanosis or clubbing.  GI:   Soft & nt; nml bowel sounds; no organomegaly or masses detected.   Musco: Warm bil, no deformities or joint swelling noted.   Neuro: alert, no focal deficits noted.    Skin: Warm, no lesions or rashes    Lab Results:  CBC  BNP No results found for: BNP  ProBNP No results found for: PROBNP  Imaging: Dg Chest 2 View  Result Date: 12/15/2017 CLINICAL DATA:  84 year old female with ongoing shortness of breath, weakness and dry cough for 1 week. Recently diagnosed with pneumonia. EXAM: CHEST - 2 VIEW COMPARISON:  Chest x-ray 11/23/2017.  Chest CT 11/24/2017. FINDINGS: When compared to the prior chest x-ray from 11/23/2017, there continues to be widespread but patchy peripheral predominant areas of airspace consolidation and ground-glass opacities with some associated interstitial prominence. These appear slightly less confluent than the prior examination, particularly in the region of the right lower lobe, however, the overall distribution is very similar to the prior  examination. Trace right pleural effusion. No definite left pleural effusion. No pneumothorax. No evidence of pulmonary edema. Heart size is normal. Upper mediastinal contours are within normal limits. Aortic atherosclerosis. IMPRESSION: 1. Minimal improvement in aeration compared to prior study from 11/23/2017. Findings are favored to reflect a resolving pneumonia, potentially with post inflammatory process such as evolving cryptogenic organizing pneumonia (COP). Based on the appearance of the prior chest CT, the possibility of multifocal neoplasm such as adenocarcinoma could also be considered, but is not strongly favored. As previously suggested, repeat chest CT (preferably high-resolution chest CT) is recommended in early February 2020 to reassess these findings. 2. Aortic atherosclerosis. Electronically Signed   By: Vinnie Langton M.D.   On: 12/15/2017 17:06   Ct Chest Wo Contrast  Result Date: 12/16/2017 CLINICAL DATA:  Follow-up pneumonia. EXAM: CT CHEST WITHOUT CONTRAST TECHNIQUE: Multidetector CT imaging of the chest was performed following the standard protocol without IV contrast. COMPARISON:  CT chest 11/24/2017, 12/23/2016. Multiple prior chest x-rays, most recently yesterday. FINDINGS: Cardiovascular: Marked cardiomegaly. Severe aortic valvular calcification. Mild three-vessel coronary atherosclerosis. Approximate 5.1 cm ascending thoracic aortic aneurysm as noted on the prior examinations. Severe atherosclerosis involving the thoracic and proximal abdominal aorta. Enlarged pulmonary arteries, the RIGHT main pulmonary artery measuring up to 3.2 cm and the LEFT main pulmonary artery measuring up to 3.5 cm. Mediastinum/Nodes: No pathologically enlarged mediastinal, hilar or axillary lymph nodes. No mediastinal masses. Normal-appearing esophagus. Atrophic thyroid gland containing subcentimeter nodules. Lungs/Pleura: Emphysematous changes throughout both lungs. Since the examination 3 weeks ago,  marked progression of the now confluent airspace opacities throughout both lungs, with greatest involvement in the [place] and RIGHT LOWER LOBE. The masslike opacity in the LEFT UPPER LOBE has also progressed in the interval. Small BILATERAL pleural effusions are present. Upper Abdomen: Small amount of perihepatic ascites. Visualized upper abdomen otherwise unremarkable for the unenhanced technique. Musculoskeletal: Exaggeration of the usual thoracic kyphosis. Diffuse degenerative disc disease and spondylosis throughout the thoracic spine. IMPRESSION: 1. Marked progression of pneumonia involving both lungs since the CT 3 weeks ago. The most confluent disease is in the RIGHT UPPER LOBE and RIGHT LOWER LOBE. 2. New small BILATERAL pleural effusions. 3. Marked cardiomegaly. Severe aortic valvular calcification. Mild three-vessel coronary atherosclerosis. 4. Ascending thoracic aortic aneurysm measuring up to 5.1 cm as noted previously. Recommendations for  follow-up were given on the prior examination three weeks ago and include: Semi-annual imaging followup by CTA or MRA and referral to cardiothoracic surgery if not already obtained. This recommendation follows 2010 ACCF/AHA/AATS/ACR/ASA/SCA/SCAI/SIR/STS/SVM Guidelines for the Diagnosis and Management of Patients With Thoracic Aortic Disease. Circulation. 2010; 121: J628-B151. 5. Small amount of perihepatic ascites. Aortic Atherosclerosis (ICD10-I70.0) and Emphysema (ICD10-J43.9). Electronically Signed   By: Evangeline Dakin M.D.   On: 12/16/2017 19:54   Dg Esophagus  Result Date: 12/19/2017 CLINICAL DATA:  Aspiration pneumonia. EXAM: ESOPHOGRAM/BARIUM SWALLOW TECHNIQUE: Single contrast examination was performed using  thin barium. FLUOROSCOPY TIME:  Fluoroscopy Time:  2 minutes Radiation Exposure Index (if provided by the fluoroscopic device): Number of Acquired Spot Images: 0 COMPARISON:  CT 12/16/2017 FINDINGS: Fluoroscopic evaluation of swallowing  demonstrates disruption of primary esophageal peristaltic waves and stasis of contrast. There is tapering of the distal esophagus with smooth long segment mild narrowing in the distal esophagus. No focal stricture. No fold thickening or mass. The 13 mm barium tablet sticks in the mid to distal esophagus. IMPRESSION: Slight generalized tapering and long segment narrowing of the distal esophagus. The 13 mm barium tablet sticks in this area. Esophageal dysmotility. Electronically Signed   By: Rolm Baptise M.D.   On: 12/19/2017 08:53   Dg Swallowing Func-speech Pathology  Result Date: 12/19/2017 Objective Swallowing Evaluation: Type of Study: MBS-Modified Barium Swallow Study  Patient Details Name: Julie Mosley MRN: [id number] Date of Birth: Oct 21, 1926 Today's Date: 12/19/2017 Time: SLP Start Time (ACUTE ONLY): 0844 -SLP Stop Time (ACUTE ONLY): 0907 SLP Time Calculation (min) (ACUTE ONLY): 23 min Past Medical History: Past Medical History: Diagnosis Date . Acute respiratory failure with hypoxia ([place])  . AKI (acute kidney injury) ([place])  . Cancer ([place])  . Chronic anticoagulation 12/13/2012  Followed by Dr. Chapman Fitch . Dilated aortic root ([place]) 12/13/2012  4.5 cm 2013.  Marland Kitchen HCAP (healthcare-associated pneumonia)  . Hypertension  . Lung nodule seen on imaging study  . Mild aortic stenosis 12/13/2012 . TIA (transient ischemic attack)  Past Surgical History: Past Surgical History: Procedure Laterality Date . BREAST LUMPECTOMY    bilateral . CESAREAN SECTION   . HIP SURGERY   . SKIN BIOPSY    From left foot. HPI: 84 y.o. female who presented to the [facility] on 12/15/2017 with persistent PNA. The patient was recently admitted 10/31-11/5 and treated for HCAP, D/Cd to a rehab facility; returned home but developed progressive SOB, fatigue. PMH includes A-fib on Coumadin, TIA, aortic stenosis, LBBB and melanoma surgically removed.  Subjective: pt alert, HOH Assessment / Plan / Recommendation CHL IP CLINICAL IMPRESSIONS 12/19/2017 Clinical  Impression Pt presents with what is likely a multifactorial dysphagia, with moderate oropharyngeal dysphagia observed on MBS and esophagram showing dysmotility and narrowing. Her oral phase is not well organized, with poor bolus cohesion, inefficient posterior transit, and premature spillage that reaches the pyriform sinuses with all liquid consistencies. Premature spillage also spills directly into the airway with all liquids tested. She primarily penetrates, but penetration goes as deep as to the vocal folds with pt not making any attempts to clear spontaneously, even when mild-moderate amounts of honey thick liquids enter the laryngeal vestibule, coating all the way down to the vocal folds. Thin liquids were aspirated with a weak cough triggered, but she needed cues for a more effortful volitional cough to clear penetrates and aspirates. Attempted a chin tuck, which worsened aspiration. Also attempted oral holding prior to swallow, with inconsistent results. The most effective strategies  were a combination of holding the bolus and manipulating bolus size to small spoonfuls. Solids were better managed from an airway protection standpoint, but this is what she has the most subjective complaints about (perhaps related to her esophageal issues). Pt is likely having episodes of aspiration across the course of a meal from a prandial standpoint, but she may also be at risk for post-prandial aspiration. Upon completion of the study and discussion of results, pt verbalized her desire for quality over quantity of life. Should this be her main priority, would consider allowing unrestricted PO intake, with implementation of strategies to reduce but not eliminate the risk of aspiration. Reached out to MD following testing - will await further delineation of [place] prior to starting any type of diet. SLP Visit Diagnosis Dysphagia, oropharyngeal phase (R13.12) Attention and concentration deficit following -- Frontal lobe and  executive function deficit following -- Impact on safety and function Moderate aspiration risk   CHL IP TREATMENT RECOMMENDATION 12/19/2017 Treatment Recommendations Therapy as outlined in treatment plan below   Prognosis 12/19/2017 Prognosis for Safe Diet Advancement Fair Barriers to Reach Goals Time post onset;Severity of deficits Barriers/Prognosis Comment -- CHL IP DIET RECOMMENDATION 12/19/2017 SLP Diet Recommendations (No Data) Liquid Administration via -- Medication Administration Whole meds with puree Compensations -- Postural Changes --   CHL IP OTHER RECOMMENDATIONS 12/19/2017 Recommended Consults -- Oral Care Recommendations Oral care QID Other Recommendations --   CHL IP FOLLOW UP RECOMMENDATIONS 12/19/2017 Follow up Recommendations (No Data)   CHL IP FREQUENCY AND DURATION 12/19/2017 Speech Therapy Frequency (ACUTE ONLY) min 2x/week Treatment Duration 2 weeks      CHL IP ORAL PHASE 12/19/2017 Oral Phase Impaired Oral - Pudding Teaspoon -- Oral - Pudding Cup -- Oral - Honey Teaspoon -- Oral - Honey Cup Decreased bolus cohesion;Premature spillage Oral - Nectar Teaspoon -- Oral - Nectar Cup Decreased bolus cohesion;Premature spillage Oral - Nectar Straw -- Oral - Thin Teaspoon Decreased bolus cohesion;Premature spillage Oral - Thin Cup Decreased bolus cohesion;Premature spillage Oral - Thin Straw -- Oral - Puree Decreased bolus cohesion Oral - Mech Soft Decreased bolus cohesion Oral - Regular -- Oral - Multi-Consistency -- Oral - Pill -- Oral Phase - Comment --  CHL IP PHARYNGEAL PHASE 12/19/2017 Pharyngeal Phase Impaired Pharyngeal- Pudding Teaspoon -- Pharyngeal -- Pharyngeal- Pudding Cup -- Pharyngeal -- Pharyngeal- Honey Teaspoon -- Pharyngeal -- Pharyngeal- Honey Cup Penetration/Aspiration before swallow;Pharyngeal residue - valleculae Pharyngeal Material enters airway, CONTACTS cords and not ejected out Pharyngeal- Nectar Teaspoon -- Pharyngeal -- Pharyngeal- Nectar Cup Pharyngeal residue -  valleculae;Penetration/Aspiration before swallow Pharyngeal Material enters airway, CONTACTS cords and not ejected out Pharyngeal- Nectar Straw -- Pharyngeal -- Pharyngeal- Thin Teaspoon Penetration/Aspiration before swallow Pharyngeal Material enters airway, remains ABOVE vocal cords and not ejected out Pharyngeal- Thin Cup Penetration/Aspiration before swallow Pharyngeal Material enters airway, passes BELOW cords then ejected out Pharyngeal- Thin Straw -- Pharyngeal -- Pharyngeal- Puree WFL Pharyngeal -- Pharyngeal- Mechanical Soft WFL Pharyngeal -- Pharyngeal- Regular -- Pharyngeal -- Pharyngeal- Multi-consistency -- Pharyngeal -- Pharyngeal- Pill -- Pharyngeal -- Pharyngeal Comment --  CHL IP CERVICAL ESOPHAGEAL PHASE 12/19/2017 Cervical Esophageal Phase WFL Pudding Teaspoon -- Pudding Cup -- Honey Teaspoon -- Honey Cup -- Nectar Teaspoon -- Nectar Cup -- Nectar Straw -- Thin Teaspoon -- Thin Cup -- Thin Straw -- Puree -- Mechanical Soft -- Regular -- Multi-consistency -- Pill -- Cervical Esophageal Comment -- Germain Osgood 12/19/2017, 10:48 AM  Germain Osgood, M.A. [place] Acute Environmental education officer [phone] Office [phone]  No flowsheet data found.  No results found for: NITRICOXIDE      Assessment & Plan:   Pulmonary infiltrates Progressive pulmonary infiltrates bilaterally right greater than left with possible underlying Boop/COP versus aspiration pneumonitis.  Patient had silent aspiration noted on modified barium swallow.  She had significant clinical improvement with steroids. O2 demands are decreasing. Check chest x-ray today.  Decrease oxygen to 2 L to keep O2 saturation greater than 90%.  Continue on slow prednisone taper.  She is currently on 5 mg. Point forward will need a follow-up CT chest in 6 to 8 weeks.  If able can consider spirometry on return Patient is on an ACE inhibitor with persistent cough.  Suspect this is aggravating her cough  this seems to be worse at night.  We will try to change her ACE inhibitor over to ARB alternative until she can get in with her primary care physician as I do not want her having significant coughing episodes throughout the night with her aspiration history Plan  Patient Instructions  Check Chest xray today .  Stop Lisinopril as it may be aggravating your cough .  Begin Diovan 80mg  daily .  You will need to follow up with Primary MD for further blood pressure issues.  Decrease Oxygen 2l/m .  Aspiration precautions as discussed.  Taper off Prednisone as directed.  Follow up with Dr. Chase Caller in 2 weeks and As needed  (or [place] NP )  Please contact office for sooner follow up if symptoms do not improve or worsen or seek emergency care       Cough Chronic cough-questionable aggravated by ACE inhibitor.  We will change her ACE to ARB .   Plan  Patient Instructions  Check Chest xray today .  Stop Lisinopril as it may be aggravating your cough .  Begin Diovan 80mg  daily .  You will need to follow up with Primary MD for further blood pressure issues.  Decrease Oxygen 2l/m .  Aspiration precautions as discussed.  Taper off Prednisone as directed.  Follow up with Dr. Chase Caller in 2 weeks and As needed  (or [place] NP )  Please contact office for sooner follow up if symptoms do not improve or worsen or seek emergency care       Chronic respiratory failure with hypoxia ([place]) O2 demands are decreasing.  Adjust oxygen to 2 L.     Rexene Edison, NP 01/01/2018

## 2018-01-03 ENCOUNTER — Telehealth: Payer: Self-pay | Admitting: Adult Health

## 2018-01-03 DIAGNOSIS — J189 Pneumonia, unspecified organism: Secondary | ICD-10-CM

## 2018-01-03 DIAGNOSIS — J181 Lobar pneumonia, unspecified organism: Principal | ICD-10-CM

## 2018-01-03 NOTE — Telephone Encounter (Signed)
Results still not yet received.

## 2018-01-03 NOTE — Telephone Encounter (Addendum)
No cxr results received as of yet Called [facility] and spoke with Summer to verify fax number > verified that it was faxed to [phone]. Asked Summer to please fax results to [phone] for triage, to my attn. Summer did mention that their fax machine is "very slow". Will continue to await results.

## 2018-01-04 NOTE — Telephone Encounter (Signed)
Fax received Routing to TP to review results

## 2018-01-04 NOTE — Telephone Encounter (Signed)
Called back and spoke with Julie Mosley and advised that the fax was still not here  She is going to refax the results  Will await these in triage

## 2018-01-04 NOTE — Telephone Encounter (Signed)
TP viewed cxr results faxed from [facility]  Per TP: why was the cxr not done at [facility]?  We are unable to view these results and compare to previous study.  The report states moderate right pleural effusion consistent with congestive heart failure.  Because we are unable to physically view images, would recommend pt go to the ER for evaluation and further treatment if needed.  Called spoke with patient's son [name].  Explained the above to [place].  [name] stated that he does not think patient will be willing to go back to the ER.  [name] asked if patient can have another cxr instead.  Per TP: this is fine, but it MUST be at our facility and if it still shows a large collection of fluid, we will need to see what needs to be done and ER may still be the best option.  [name] voiced his understanding but is unable to take pt to cxr himself tomorrow.  [name] asked that I call his brother Darrin @ [phone].  Called spoke with Darrin and explained the above.  Darrin reported they did not go to the [place] office for cxr because they did not think they would make it there before that dept closed.  Darrin reported that patient will not go back to the hospital and will call [facility] to correlate transportation for cxr at the [facility] office tomorrow.  Advised Darrin will go ahead and place this order and call him in the morning to see if [facility] will be able to take care of this.

## 2018-01-05 ENCOUNTER — Ambulatory Visit (INDEPENDENT_AMBULATORY_CARE_PROVIDER_SITE_OTHER)
Admission: RE | Admit: 2018-01-05 | Discharge: 2018-01-05 | Disposition: A | Discharge status: Home / Self Care | Payer: Medicare Other | Source: Ambulatory Visit | Attending: Adult Health | Admitting: Adult Health

## 2018-01-05 DIAGNOSIS — J189 Pneumonia, unspecified organism: Secondary | ICD-10-CM

## 2018-01-05 DIAGNOSIS — J181 Lobar pneumonia, unspecified organism: Secondary | ICD-10-CM

## 2018-01-05 NOTE — Telephone Encounter (Signed)
Daughter in law Vaughan Basta [phone]) requesting to speak to TP further clarification of what was told to her husband.

## 2018-01-05 NOTE — Telephone Encounter (Signed)
Results from cxr today, 12/13: Notes recorded by Julie Needles, NP on 01/05/2018 at 11:42 AM EST Pt /son aware  cXR is stable  Clinically she is improved Cont w/ ov recs Please contact office for sooner follow up if symptoms do not improve or worsen or seek emergency care   Called and spoke with pt's daughter-in-law Julie Mosley. I stated to her the information that was stated by TP in regards to the xray that pt was stable and clinically she is improved.  After I stated that to [facility], she stated that pt was about to be discharged from [facility] and she was going to be staying with them and due to the fact that pt is on o2, Julie Mosley stated she was concerned about that as she has never had to care for someone who was on o2. I stated to Julie Mosley that the workers at [employer] should be able to go over everything with her and her husband before they take pt home. They will go over with them how they need to take care and help pt manage the o2 and all other needs for her.  Even after I stated that to [facility], she still said that she is concerned about bring pt home in case anything happened also due to her about to finish up the steroids. I stated to her once they got pt home, if she did seem like she was becoming worse and if they did have concerns about her to call our office and we could always see if pt needed to be seen sooner. Julie Mosley expressed understanding. Nothing further needed.

## 2018-01-05 NOTE — Telephone Encounter (Signed)
Call made to son Darrin, the facilities [facility] is broke down and he is having to take the patient himself to get CXR. He states he is trying to have her there by 11am. Son asks will we get results today and will everything be addressed today. I explained to the son that order was placed STAT so we will get results quickly and it would be addressed today. Voiced understanding. Will leave message open until CXR results have been received.

## 2018-01-08 ENCOUNTER — Encounter: Payer: Self-pay | Admitting: Cardiology

## 2018-01-08 ENCOUNTER — Ambulatory Visit: Payer: Medicare Other | Admitting: Cardiology

## 2018-01-08 VITALS — BP 106/58 | HR 83 | Ht 67.0 in | Wt 111.8 lb

## 2018-01-08 DIAGNOSIS — I712 Thoracic aortic aneurysm, without rupture, unspecified: Secondary | ICD-10-CM

## 2018-01-08 DIAGNOSIS — I4819 Other persistent atrial fibrillation: Secondary | ICD-10-CM | POA: Diagnosis not present

## 2018-01-08 DIAGNOSIS — I350 Nonrheumatic aortic (valve) stenosis: Secondary | ICD-10-CM | POA: Diagnosis not present

## 2018-01-08 NOTE — Progress Notes (Addendum)
Cardiology Office Note:    Date:  01/08/2018   ID:  YULIYA NOVA, DOB 02-Aug-1926, MRN [id number]  PCP:  Dineen Kid, MD  Cardiologist:  No primary care provider on file.  Electrophysiologist:  None   Referring MD: ViaLennette Bihari, MD     History of Present Illness:    Julie Mosley is a 84 y.o. female here for the follow-up of persistent atrial fibrillation hypertension stroke dilated aortic root 5 cm up from 4.5 up from 4.3.  Also has moderate to severe aortic stenosis.  Prior bradycardia on no AV nodal blocking agents.  Echocardiogram in 2018 showed moderate l aortic stenosis  CT scan on 12/16/2017 showedMarked cardiomegaly. Severe aortic valvular calcification. Mild three-vessel coronary atherosclerosis. Approximate 5.1 cm ascending thoracic aortic aneurysm as noted on the prior examinations. Severe atherosclerosis involving the thoracic and proximal abdominal aorta. Enlarged pulmonary arteries, the RIGHT main pulmonary artery measuring up to 3.2 cm and the LEFT main pulmonary artery measuring up to 3.5 cm.  She has had quite a rough couple months.  Admitted with pneumonia twice then BOOP.  Possible aspiration effect.  Has lost quite a bit of weight.  BMI currently 17.  Blood pressure 106/58.  No chest pain, no syncope.  She does have oxygen on currently.  Past Medical History:  Diagnosis Date  . Acute respiratory failure with hypoxia ([place])   . AKI (acute kidney injury) ([place])   . Cancer ([place])   . Chronic anticoagulation 12/13/2012   Followed by Dr. Chapman Fitch  . Dilated aortic root ([place]) 12/13/2012   4.5 cm 2013.   Marland Kitchen HCAP (healthcare-associated pneumonia)   . Hypertension   . Lung nodule seen on imaging study   . Mild aortic stenosis 12/13/2012  . TIA (transient ischemic attack)     Past Surgical History:  Procedure Laterality Date  . BREAST LUMPECTOMY     bilateral  . CESAREAN SECTION    . HIP SURGERY    . SKIN BIOPSY     From left foot.    Current  Medications: Current Meds  Medication Sig  . acetaminophen (TYLENOL) 650 MG CR tablet Take 650 mg by mouth every 8 (eight) hours as needed for pain.  . Omega-3 Fatty Acids (FISH OIL) 1000 MG CAPS Take 1 capsule by mouth daily.  . pantoprazole (PROTONIX) 40 MG tablet Take 1 tablet (40 mg total) by mouth daily.  . valsartan (DIOVAN) 80 MG tablet Take 80 mg by mouth daily.  Marland Kitchen warfarin (COUMADIN) 3 MG tablet Take 1 tablet (3 mg total) by mouth daily. SNF MD to monitor INR daily for the next 3 days and adjust Coumadin dose as needed  . [DISCONTINUED] amLODipine (NORVASC) 5 MG tablet TAKE 1 TABLET BY MOUTH  DAILY  . [DISCONTINUED] hydrochlorothiazide (HYDRODIURIL) 25 MG tablet Take 25 mg by mouth daily.     Allergies:   Valium [diazepam]   Social History   Socioeconomic History  . Marital status: Married    Spouse name: Not on file  . Number of children: Not on file  . Years of education: Not on file  . Highest education level: Not on file  Occupational History  . Not on file  Social Needs  . Financial resource strain: Not on file  . Food insecurity:    Worry: Not on file    Inability: Not on file  . Transportation needs:    Medical: Not on file    Non-medical: Not on file  Tobacco Use  .  Smoking status: Former Smoker    Last attempt to quit: 04/02/1968    Years since quitting: 49.8  . Smokeless tobacco: Never Used  Substance and Sexual Activity  . Alcohol use: No  . Drug use: No  . Sexual activity: Never  Lifestyle  . Physical activity:    Days per week: Not on file    Minutes per session: Not on file  . Stress: Not on file  Relationships  . Social connections:    Talks on phone: Not on file    Gets together: Not on file    Attends religious service: Not on file    Active member of club or organization: Not on file    Attends meetings of clubs or organizations: Not on file    Relationship status: Not on file  Other Topics Concern  . Not on file  Social History  Narrative  . Not on file     Family History: The patient's family history includes Heart attack in her brother, mother, and sister.  ROS:   Please see the history of present illness.     All other systems reviewed and are negative.  EKGs/Labs/Other Studies Reviewed:    The following studies were reviewed today: Hospital records lab work prior office notes  EKG:  EKG is not ordered today.    Recent Labs: 12/15/2017: ALT 21 12/20/2017: BUN 37; Creatinine, Ser 0.98; Hemoglobin 13.3; Magnesium 2.0; Platelets 126; Potassium 4.9; Sodium 137  Recent Lipid Panel    Component Value Date/Time   CHOL  11/25/2007 0429    116        ATP III CLASSIFICATION:  <200     mg/dL   Desirable  200-239  mg/dL   Borderline High  >=240    mg/dL   High   TRIG 72 11/25/2007 0429   HDL 41 11/25/2007 0429   CHOLHDL 2.8 11/25/2007 0429   VLDL 14 11/25/2007 0429   LDLCALC  11/25/2007 0429    61        Total Cholesterol/HDL:CHD Risk Coronary Heart Disease Risk Table                     Men   Women  1/2 Average Risk   3.4   3.3    Physical Exam:    VS:  BP (!) 106/58   Pulse 83   Ht 5\' 7"  (1.702 m)   Wt 111 lb 12.8 oz (50.7 kg)   LMP  (LMP Unknown)   SpO2 94%   BMI 17.51 kg/m     Wt Readings from Last 3 Encounters:  01/08/18 111 lb 12.8 oz (50.7 kg)  01/01/18 111 lb (50.3 kg)  12/16/17 122 lb 12.7 oz (55.7 kg)     GEN: Thin, elderly, thin, oxygen, in wheelchair in no acute distress HEENT: Normal NECK: No JVD; No carotid bruits LYMPHATICS: No lymphadenopathy CARDIAC: Irregularly irregular, no murmurs, rubs, gallops RESPIRATORY:  Clear to auscultation without rales, wheezing or rhonchi  ABDOMEN: Soft, non-tender, non-distended MUSCULOSKELETAL:  No edema; No deformity  SKIN: Warm and dry NEUROLOGIC:  Alert and oriented x 3 PSYCHIATRIC:  Normal affect   ASSESSMENT:    1. Thoracic aortic aneurysm without rupture (HCC)   2. Persistent atrial fibrillation   3. Aortic stenosis,  moderate    PLAN:    In order of problems listed above:  Dilated aortic root -5.1 cm.  Fairly stable.  Slightly enlarged from 5.0 previous.  She is not a  surgical candidate.  Permanent atrial fibrillation - Well rate controlled, no changes.  Essential hypertension - Blood pressure has been quite low recently.  With her dramatic weight loss, we will go ahead and discontinue her amlodipine 5 mg and her hydrochlorothiazide 25 mg.  Chronic anticoagulation - Monitored by [facility].  Dose has been decreased quite significantly.  Moderate to severe aortic stenosis - Based upon her current condition, I do not think that she would be either a open or TAVR candidate.  Severe protein calorie malnutrition - Continue to encourage protein use, Ensure.  Home oxygen use hypoxia in the setting of interstitial lung disease - Following up with pulmonary.  Medication Adjustments/Labs and Tests Ordered: Current medicines are reviewed at length with the patient today.  Concerns regarding medicines are outlined above.  No orders of the defined types were placed in this encounter.  No orders of the defined types were placed in this encounter.   Patient Instructions  Medication Instructions:  1) DISCONTINUE Amlodipine 2) DISCONTINUE Hydrochlorothiazide  If you need a refill on your cardiac medications before your next appointment, please call your pharmacy.   Lab work: None If you have labs (blood work) drawn today and your tests are completely normal, you will receive your results only by: Marland Kitchen MyChart Message (if you have MyChart) OR . A paper copy in the mail If you have any lab test that is abnormal or we need to change your treatment, we will call you to review the results.  Testing/Procedures: None  Follow-Up: At [facility], you and your health needs are our priority.  As part of our continuing mission to provide you with exceptional heart care, we have created  designated Provider Care Teams.  These Care Teams include your primary Cardiologist (physician) and Advanced Practice Providers (APPs -  Physician Assistants and Nurse Practitioners) who all work together to provide you with the care you need, when you need it. You will need a follow up appointment in 6 months.  Please call our office 2 months in advance to schedule this appointment.  You may see Dr. Marlou Porch or one of the following Advanced Practice Providers on your designated Care Team:   Truitt Merle, NP Cecilie Kicks, NP . Kathyrn Drown, NP  Any Other Special Instructions Will Be Listed Below (If Applicable).       Signed, Candee Furbish, MD  01/08/2018 1:48 PM    [facility]

## 2018-01-08 NOTE — Patient Instructions (Signed)
Medication Instructions:  1) DISCONTINUE Amlodipine 2) DISCONTINUE Hydrochlorothiazide  If you need a refill on your cardiac medications before your next appointment, please call your pharmacy.   Lab work: None If you have labs (blood work) drawn today and your tests are completely normal, you will receive your results only by: Marland Kitchen MyChart Message (if you have MyChart) OR . A paper copy in the mail If you have any lab test that is abnormal or we need to change your treatment, we will call you to review the results.  Testing/Procedures: None  Follow-Up: At [facility], you and your health needs are our priority.  As part of our continuing mission to provide you with exceptional heart care, we have created designated Provider Care Teams.  These Care Teams include your primary Cardiologist (physician) and Advanced Practice Providers (APPs -  Physician Assistants and Nurse Practitioners) who all work together to provide you with the care you need, when you need it. You will need a follow up appointment in 6 months.  Please call our office 2 months in advance to schedule this appointment.  You may see Dr. Marlou Porch or one of the following Advanced Practice Providers on your designated Care Team:   Truitt Merle, NP Cecilie Kicks, NP . Kathyrn Drown, NP  Any Other Special Instructions Will Be Listed Below (If Applicable).

## 2018-01-15 ENCOUNTER — Ambulatory Visit (INDEPENDENT_AMBULATORY_CARE_PROVIDER_SITE_OTHER)
Admission: RE | Admit: 2018-01-15 | Discharge: 2018-01-15 | Disposition: A | Discharge status: Home / Self Care | Payer: Medicare Other | Source: Ambulatory Visit | Attending: Adult Health | Admitting: Adult Health

## 2018-01-15 ENCOUNTER — Encounter: Payer: Self-pay | Admitting: Adult Health

## 2018-01-15 ENCOUNTER — Ambulatory Visit: Payer: Medicare Other | Admitting: Adult Health

## 2018-01-15 VITALS — BP 108/66 | HR 94 | Ht 67.0 in | Wt 114.0 lb

## 2018-01-15 DIAGNOSIS — J181 Lobar pneumonia, unspecified organism: Secondary | ICD-10-CM

## 2018-01-15 DIAGNOSIS — R918 Other nonspecific abnormal finding of lung field: Secondary | ICD-10-CM

## 2018-01-15 DIAGNOSIS — K219 Gastro-esophageal reflux disease without esophagitis: Secondary | ICD-10-CM

## 2018-01-15 DIAGNOSIS — J9611 Chronic respiratory failure with hypoxia: Secondary | ICD-10-CM | POA: Diagnosis not present

## 2018-01-15 DIAGNOSIS — J189 Pneumonia, unspecified organism: Secondary | ICD-10-CM

## 2018-01-15 NOTE — Progress Notes (Signed)
@Patient  ID: Julie Mosley, female    DOB: 12/22/1926, 84 y.o.   MRN: [id number]  Chief Complaint  Patient presents with  . Follow-up    pneumonia     Referring provider: ViaLennette Bihari, MD  HPI:  84 year old female former smoker quit in 1980 with minimum smoking exposure seen for pulmonary consult during hospitalization December 17, 2017 for progressive pulmonary infiltrates and hypoxemia Medical history significant for A. fib on Coumadin, hypertension, TIA, moderate severe aortic stenosis  TEST/EVENTS :  CT chest December 16, 2017 emphysema, market progression of airspace opacities bilaterally greatest in the right upper and right lower lung and masslike opacity in the left upper lobe, small bilateral pleural effusions, TAA 5.1 cm Autoimmune/CTD-negative ANA, dsDNA, GBM, ANCA, RA factor, CCP, SSA, SSB and scleroderma  01/15/18 Follow up : PNA , O2 RF  Patient presents for a 2-week follow-up.  Patient was seen last visit after a hospitalization in November for progressive pulmonary infiltrates.  She initially was admitted in October for presumed community-acquired pneumonia.  CT chest showed new bilateral groundglass opacities and associated air bronchograms she was treated with antibiotics.  Patient unfortunately progressively worsened.  She was readmitted in November.  CT chest showed progressive confluent airspace opacities throughout both lungs.  She was treated with IV antibiotics.  Autoimmune and connective tissue work-up was negative and unrevealing. Felt to have a possible aspiration component.  Modified barium swallow showed oropharynx phase of ongoing silent aspiration.  She was started on steroids for possible [place] component.  And had some slow clinical improvement.  She did require discharge to rehab center.  She is now been discharged to home.  She was discharged on 4 L of oxygen.  However is down to 2 L.  O2 saturations are very well today on 2 L.  On room air at rest.  O2  saturations 90%. Patient is clinically feeling better.  She has decreased cough and shortness of breath.  She is regaining some of her energy level.  Patient did have an ongoing cough last visit.  ACE inhibitor was changed.  She says that cough is some better.  She is accompanied by several family members today.  Chest x-ray has not showed any significant improvement last visit.  She continues to have right greater than left bilateral infiltrates.  She denies any hemoptysis chest pain orthopnea.  No choking or dysphagia that she is aware of.  is trying to eat better.  Allergies  Allergen Reactions  . Valium [Diazepam] Other (See Comments)    Stopped breathing    Immunization History  Administered Date(s) Administered  . Pneumococcal Conjugate-13 01/02/2016    Past Medical History:  Diagnosis Date  . Acute respiratory failure with hypoxia ([place])   . AKI (acute kidney injury) ([place])   . Cancer ([place])   . Chronic anticoagulation 12/13/2012   Followed by Dr. Chapman Fitch  . Dilated aortic root ([place]) 12/13/2012   4.5 cm 2013.   Marland Kitchen HCAP (healthcare-associated pneumonia)   . Hypertension   . Lung nodule seen on imaging study   . Mild aortic stenosis 12/13/2012  . TIA (transient ischemic attack)     Tobacco History: Social History   Tobacco Use  Smoking Status Former Smoker  . Last attempt to quit: 04/02/1968  . Years since quitting: 49.8  Smokeless Tobacco Never Used   Counseling given: Not Answered   Outpatient Medications Prior to Visit  Medication Sig Dispense Refill  . acetaminophen (TYLENOL) 650 MG CR tablet  Take 650 mg by mouth every 8 (eight) hours as needed for pain.    . pantoprazole (PROTONIX) 40 MG tablet Take 1 tablet (40 mg total) by mouth daily.    . valsartan (DIOVAN) 80 MG tablet Take 80 mg by mouth daily.    Marland Kitchen warfarin (COUMADIN) 3 MG tablet Take 1 tablet (3 mg total) by mouth daily. SNF MD to monitor INR daily for the next 3 days and adjust Coumadin dose as needed    .  Omega-3 Fatty Acids (FISH OIL) 1000 MG CAPS Take 1 capsule by mouth daily.     No facility-administered medications prior to visit.      Review of Systems  Constitutional:   No  weight loss, night sweats,  Fevers, chills,  +fatigue, or  lassitude.  HEENT:   No headaches,  Difficulty swallowing,  Tooth/dental problems, or  Sore throat,                No sneezing, itching, ear ache, nasal congestion, post nasal drip,   CV:  No chest pain,  Orthopnea, PND, swelling in lower extremities, anasarca, dizziness, palpitations, syncope.   GI  No heartburn, indigestion, abdominal pain, nausea, vomiting, diarrhea, change in bowel habits, loss of appetite, bloody stools.   Resp:   No chest wall deformity  Skin:  . Heal wound   GU: no dysuria, change in color of urine, no urgency or frequency.  No flank pain, no hematuria   MS:  No joint pain or swelling.  No decreased range of motion.  No back pain.    Physical Exam  BP 108/66 (BP Location: Left Arm, Cuff Size: Normal)   Pulse 94   Ht 5\' 7"  (1.702 m)   Wt 114 lb (51.7 kg)   LMP  (LMP Unknown)   SpO2 95%   BMI 17.85 kg/m   GEN: A/Ox3; pleasant , NAD, thin elderly and cachectic on oxygen   HEENT:  [place]/AT,  EACs-clear, TMs-wnl, NOSE-clear, THROAT-clear, no lesions, no postnasal drip or exudate noted.   NECK:  Supple w/ fair ROM; no JVD; normal carotid impulses w/o bruits; no thyromegaly or nodules palpated; no lymphadenopathy.    RESP bibasilar rhonchi no accessory muscle use, no dullness to percussion  CARD:  RRR, no m/r/g, tr peripheral edema, pulses intact, no cyanosis or clubbing.  GI:   Soft & nt; nml bowel sounds; no organomegaly or masses detected.   Musco: Warm bil, no deformities or joint swelling noted.   Neuro: alert, no focal deficits noted.    Skin: Warm, no lesions or rashes    Lab Results:  CBC    Component Value Date/Time   WBC 8.6 12/20/2017 0406   RBC 4.33 12/20/2017 0406   HGB 13.3 12/20/2017 0406     HGB 14.0 11/21/2016 1256   HCT 42.7 12/20/2017 0406   HCT 42.1 11/21/2016 1256   PLT 126 (L) 12/20/2017 0406   PLT 134 (L) 11/21/2016 1256   MCV 98.6 12/20/2017 0406   MCV 95 11/21/2016 1256   MCH 30.7 12/20/2017 0406   MCHC 31.1 12/20/2017 0406   RDW 15.6 (H) 12/20/2017 0406   RDW 15.5 (H) 11/21/2016 1256   LYMPHSABS 0.7 12/15/2017 1505   LYMPHSABS 1.5 11/21/2016 1256   MONOABS 0.4 12/15/2017 1505   EOSABS 0.0 12/15/2017 1505   EOSABS 0.1 11/21/2016 1256   BASOSABS 0.0 12/15/2017 1505   BASOSABS 0.0 11/21/2016 1256    BMET    Component Value Date/Time  NA 137 12/20/2017 0406   NA 144 11/21/2016 1256   K 4.9 12/20/2017 0406   CL 105 12/20/2017 0406   CO2 28 12/20/2017 0406   GLUCOSE 95 12/20/2017 0406   BUN 37 (H) 12/20/2017 0406   BUN 19 11/21/2016 1256   CREATININE 0.98 12/20/2017 0406   CALCIUM 8.3 (L) 12/20/2017 0406   GFRNONAA 51 (L) 12/20/2017 0406   GFRAA 59 (L) 12/20/2017 0406    BNP No results found for: BNP  ProBNP No results found for: PROBNP  Imaging: Dg Chest 2 View  Result Date: 01/15/2018 CLINICAL DATA:  84 year old female with chest cough and soreness. Former smoker. EXAM: CHEST - 2 VIEW COMPARISON:  01/05/2018 and earlier. FINDINGS: Patchy and confluent bilateral pulmonary opacities have progressed since October, but not significantly changed more recently. Confluent right lateral and lower lung involvement. Superimposed cardiomegaly with Calcified aortic atherosclerosis. No pneumothorax. Persistent small right pleural effusion. No acute osseous abnormality identified. Negative visible bowel gas pattern. IMPRESSION: 1. No improvement since 01/05/2018 in confluent bilateral pulmonary opacity which is greater on the right, has been present, and progressed somewhat since October. In addition to unrelenting infection, consider noninfectious etiologies such as Organizing Pneumonia, or less likely Neoplastic. 2. No new cardiopulmonary abnormality.  Electronically Signed   By: Genevie Ann M.D.   On: 01/15/2018 15:40   Dg Chest 2 View  Result Date: 01/05/2018 CLINICAL DATA:  Reported recent aspiration.  Persistent cough EXAM: CHEST - 2 VIEW COMPARISON:  Chest radiograph December 15, 2017 and chest CT December 16, 2017 FINDINGS: There is extensive airspace opacity throughout multiple segments of the right lung with consolidation in the right base region, stable. There is patchy infiltrate in the left lower lobe which is stable. There is also patchy infiltrate in the left upper lobe which is stable. A nodular opacity in the left upper lobe posteriorly is better seen on CT but persists. No new opacity evident. Heart is mildly enlarged with pulmonary vascularity normal. No adenopathy is appreciable by radiography. Areas of lymph node prominence seen on CT are not well seen by radiography. There is aortic atherosclerosis no bone lesions. IMPRESSION: Widespread multifocal pneumonia persists without change. Consolidation is greatest in the right base. Nodular appearing opacity left upper lobe is present although better seen on CT. Stable cardiac prominence. Adenopathy seen on CT is not well seen by radiography. There is aortic atherosclerosis. Aortic Atherosclerosis (ICD10-I70.0). Electronically Signed   By: Lowella Grip III M.D.   On: 01/05/2018 11:14   Ct Chest Wo Contrast  Result Date: 12/16/2017 CLINICAL DATA:  Follow-up pneumonia. EXAM: CT CHEST WITHOUT CONTRAST TECHNIQUE: Multidetector CT imaging of the chest was performed following the standard protocol without IV contrast. COMPARISON:  CT chest 11/24/2017, 12/23/2016. Multiple prior chest x-rays, most recently yesterday. FINDINGS: Cardiovascular: Marked cardiomegaly. Severe aortic valvular calcification. Mild three-vessel coronary atherosclerosis. Approximate 5.1 cm ascending thoracic aortic aneurysm as noted on the prior examinations. Severe atherosclerosis involving the thoracic and proximal  abdominal aorta. Enlarged pulmonary arteries, the RIGHT main pulmonary artery measuring up to 3.2 cm and the LEFT main pulmonary artery measuring up to 3.5 cm. Mediastinum/Nodes: No pathologically enlarged mediastinal, hilar or axillary lymph nodes. No mediastinal masses. Normal-appearing esophagus. Atrophic thyroid gland containing subcentimeter nodules. Lungs/Pleura: Emphysematous changes throughout both lungs. Since the examination 3 weeks ago, marked progression of the now confluent airspace opacities throughout both lungs, with greatest involvement in the [place] and RIGHT LOWER LOBE. The masslike opacity in the LEFT  UPPER LOBE has also progressed in the interval. Small BILATERAL pleural effusions are present. Upper Abdomen: Small amount of perihepatic ascites. Visualized upper abdomen otherwise unremarkable for the unenhanced technique. Musculoskeletal: Exaggeration of the usual thoracic kyphosis. Diffuse degenerative disc disease and spondylosis throughout the thoracic spine. IMPRESSION: 1. Marked progression of pneumonia involving both lungs since the CT 3 weeks ago. The most confluent disease is in the RIGHT UPPER LOBE and RIGHT LOWER LOBE. 2. New small BILATERAL pleural effusions. 3. Marked cardiomegaly. Severe aortic valvular calcification. Mild three-vessel coronary atherosclerosis. 4. Ascending thoracic aortic aneurysm measuring up to 5.1 cm as noted previously. Recommendations for follow-up were given on the prior examination three weeks ago and include: Semi-annual imaging followup by CTA or MRA and referral to cardiothoracic surgery if not already obtained. This recommendation follows 2010 ACCF/AHA/AATS/ACR/ASA/SCA/SCAI/SIR/STS/SVM Guidelines for the Diagnosis and Management of Patients With Thoracic Aortic Disease. Circulation. 2010; 121: P382-N053. 5. Small amount of perihepatic ascites. Aortic Atherosclerosis (ICD10-I70.0) and Emphysema (ICD10-J43.9). Electronically Signed   By: Evangeline Dakin M.D.   On: 12/16/2017 19:54   Dg Esophagus  Result Date: 12/19/2017 CLINICAL DATA:  Aspiration pneumonia. EXAM: ESOPHOGRAM/BARIUM SWALLOW TECHNIQUE: Single contrast examination was performed using  thin barium. FLUOROSCOPY TIME:  Fluoroscopy Time:  2 minutes Radiation Exposure Index (if provided by the fluoroscopic device): Number of Acquired Spot Images: 0 COMPARISON:  CT 12/16/2017 FINDINGS: Fluoroscopic evaluation of swallowing demonstrates disruption of primary esophageal peristaltic waves and stasis of contrast. There is tapering of the distal esophagus with smooth long segment mild narrowing in the distal esophagus. No focal stricture. No fold thickening or mass. The 13 mm barium tablet sticks in the mid to distal esophagus. IMPRESSION: Slight generalized tapering and long segment narrowing of the distal esophagus. The 13 mm barium tablet sticks in this area. Esophageal dysmotility. Electronically Signed   By: Rolm Baptise M.D.   On: 12/19/2017 08:53   Dg Swallowing Func-speech Pathology  Result Date: 12/19/2017 Objective Swallowing Evaluation: Type of Study: MBS-Modified Barium Swallow Study  Patient Details Name: Julie Mosley MRN: [id number] Date of Birth: December 28, 1926 Today's Date: 12/19/2017 Time: SLP Start Time (ACUTE ONLY): 0844 -SLP Stop Time (ACUTE ONLY): 0907 SLP Time Calculation (min) (ACUTE ONLY): 23 min Past Medical History: Past Medical History: Diagnosis Date . Acute respiratory failure with hypoxia ([place])  . AKI (acute kidney injury) ([place])  . Cancer ([place])  . Chronic anticoagulation 12/13/2012  Followed by Dr. Chapman Fitch . Dilated aortic root ([place]) 12/13/2012  4.5 cm 2013.  Marland Kitchen HCAP (healthcare-associated pneumonia)  . Hypertension  . Lung nodule seen on imaging study  . Mild aortic stenosis 12/13/2012 . TIA (transient ischemic attack)  Past Surgical History: Past Surgical History: Procedure Laterality Date . BREAST LUMPECTOMY    bilateral . CESAREAN SECTION   . HIP SURGERY   . SKIN BIOPSY     From left foot. HPI: 84 y.o. female who presented to the [facility] on 12/15/2017 with persistent PNA. The patient was recently admitted 10/31-11/5 and treated for HCAP, D/Cd to a rehab facility; returned home but developed progressive SOB, fatigue. PMH includes A-fib on Coumadin, TIA, aortic stenosis, LBBB and melanoma surgically removed.  Subjective: pt alert, HOH Assessment / Plan / Recommendation CHL IP CLINICAL IMPRESSIONS 12/19/2017 Clinical Impression Pt presents with what is likely a multifactorial dysphagia, with moderate oropharyngeal dysphagia observed on MBS and esophagram showing dysmotility and narrowing. Her oral phase is not well organized, with poor bolus cohesion, inefficient posterior transit, and premature spillage that  reaches the pyriform sinuses with all liquid consistencies. Premature spillage also spills directly into the airway with all liquids tested. She primarily penetrates, but penetration goes as deep as to the vocal folds with pt not making any attempts to clear spontaneously, even when mild-moderate amounts of honey thick liquids enter the laryngeal vestibule, coating all the way down to the vocal folds. Thin liquids were aspirated with a weak cough triggered, but she needed cues for a more effortful volitional cough to clear penetrates and aspirates. Attempted a chin tuck, which worsened aspiration. Also attempted oral holding prior to swallow, with inconsistent results. The most effective strategies were a combination of holding the bolus and manipulating bolus size to small spoonfuls. Solids were better managed from an airway protection standpoint, but this is what she has the most subjective complaints about (perhaps related to her esophageal issues). Pt is likely having episodes of aspiration across the course of a meal from a prandial standpoint, but she may also be at risk for post-prandial aspiration. Upon completion of the study and discussion of results, pt verbalized her  desire for quality over quantity of life. Should this be her main priority, would consider allowing unrestricted PO intake, with implementation of strategies to reduce but not eliminate the risk of aspiration. Reached out to MD following testing - will await further delineation of [place] prior to starting any type of diet. SLP Visit Diagnosis Dysphagia, oropharyngeal phase (R13.12) Attention and concentration deficit following -- Frontal lobe and executive function deficit following -- Impact on safety and function Moderate aspiration risk   CHL IP TREATMENT RECOMMENDATION 12/19/2017 Treatment Recommendations Therapy as outlined in treatment plan below   Prognosis 12/19/2017 Prognosis for Safe Diet Advancement Fair Barriers to Reach Goals Time post onset;Severity of deficits Barriers/Prognosis Comment -- CHL IP DIET RECOMMENDATION 12/19/2017 SLP Diet Recommendations (No Data) Liquid Administration via -- Medication Administration Whole meds with puree Compensations -- Postural Changes --   CHL IP OTHER RECOMMENDATIONS 12/19/2017 Recommended Consults -- Oral Care Recommendations Oral care QID Other Recommendations --   CHL IP FOLLOW UP RECOMMENDATIONS 12/19/2017 Follow up Recommendations (No Data)   CHL IP FREQUENCY AND DURATION 12/19/2017 Speech Therapy Frequency (ACUTE ONLY) min 2x/week Treatment Duration 2 weeks      CHL IP ORAL PHASE 12/19/2017 Oral Phase Impaired Oral - Pudding Teaspoon -- Oral - Pudding Cup -- Oral - Honey Teaspoon -- Oral - Honey Cup Decreased bolus cohesion;Premature spillage Oral - Nectar Teaspoon -- Oral - Nectar Cup Decreased bolus cohesion;Premature spillage Oral - Nectar Straw -- Oral - Thin Teaspoon Decreased bolus cohesion;Premature spillage Oral - Thin Cup Decreased bolus cohesion;Premature spillage Oral - Thin Straw -- Oral - Puree Decreased bolus cohesion Oral - Mech Soft Decreased bolus cohesion Oral - Regular -- Oral - Multi-Consistency -- Oral - Pill -- Oral Phase - Comment --  CHL  IP PHARYNGEAL PHASE 12/19/2017 Pharyngeal Phase Impaired Pharyngeal- Pudding Teaspoon -- Pharyngeal -- Pharyngeal- Pudding Cup -- Pharyngeal -- Pharyngeal- Honey Teaspoon -- Pharyngeal -- Pharyngeal- Honey Cup Penetration/Aspiration before swallow;Pharyngeal residue - valleculae Pharyngeal Material enters airway, CONTACTS cords and not ejected out Pharyngeal- Nectar Teaspoon -- Pharyngeal -- Pharyngeal- Nectar Cup Pharyngeal residue - valleculae;Penetration/Aspiration before swallow Pharyngeal Material enters airway, CONTACTS cords and not ejected out Pharyngeal- Nectar Straw -- Pharyngeal -- Pharyngeal- Thin Teaspoon Penetration/Aspiration before swallow Pharyngeal Material enters airway, remains ABOVE vocal cords and not ejected out Pharyngeal- Thin Cup Penetration/Aspiration before swallow Pharyngeal Material enters airway, passes BELOW cords then ejected out  Pharyngeal- Thin Straw -- Pharyngeal -- Pharyngeal- Puree WFL Pharyngeal -- Pharyngeal- Mechanical Soft WFL Pharyngeal -- Pharyngeal- Regular -- Pharyngeal -- Pharyngeal- Multi-consistency -- Pharyngeal -- Pharyngeal- Pill -- Pharyngeal -- Pharyngeal Comment --  CHL IP CERVICAL ESOPHAGEAL PHASE 12/19/2017 Cervical Esophageal Phase WFL Pudding Teaspoon -- Pudding Cup -- Honey Teaspoon -- Honey Cup -- Nectar Teaspoon -- Nectar Cup -- Nectar Straw -- Thin Teaspoon -- Thin Cup -- Thin Straw -- Puree -- Mechanical Soft -- Regular -- Multi-consistency -- Pill -- Cervical Esophageal Comment -- Germain Osgood 12/19/2017, 10:48 AM  Germain Osgood, M.A. [facility] Pager [phone] Office [phone]                No flowsheet data found.  No results found for: NITRICOXIDE      Assessment & Plan:   Chronic respiratory failure with hypoxia ([place]) Continue on oxygen.  O2 demands are decreased Continue on current regimen  Pulmonary infiltrates Bilateral pulmonary infiltrates right greater than left presumed  underlying pneumonia.  Clinically patient is much improved however her chest x-ray continues to have significant pulmonary infiltrates questionable etiology.  On return in 3 to 4 weeks if not improved will need further work-up.  Possible repeat CT chest or consideration of bronchoscopy  Esophageal reflux Continue on GERD treatment.  Aspiration precautions.     Rexene Edison, NP 01/15/2018

## 2018-01-15 NOTE — Assessment & Plan Note (Signed)
Bilateral pulmonary infiltrates right greater than left presumed underlying pneumonia.  Clinically patient is much improved however her chest x-ray continues to have significant pulmonary infiltrates questionable etiology.  On return in 3 to 4 weeks if not improved will need further work-up.  Possible repeat CT chest or consideration of bronchoscopy

## 2018-01-15 NOTE — Assessment & Plan Note (Signed)
Continue on oxygen.  O2 demands are decreased Continue on current regimen

## 2018-01-15 NOTE — Patient Instructions (Addendum)
Check Chest xray today .   You will need to follow up with Primary MD for further blood pressure issues.  Continue on Oxygen 2l/m .  Aspiration precautions as discussed.   Follow up with Dr. Chase Caller in 4 weeks and As needed  (or Bartolo Montanye NP ) with Chest xray .  Please contact office for sooner follow up if symptoms do not improve or worsen or seek emergency care

## 2018-01-15 NOTE — Assessment & Plan Note (Signed)
Continue on GERD treatment.  Aspiration precautions.

## 2018-01-16 ENCOUNTER — Other Ambulatory Visit: Payer: Self-pay

## 2018-01-16 DIAGNOSIS — J181 Lobar pneumonia, unspecified organism: Principal | ICD-10-CM

## 2018-01-16 DIAGNOSIS — J189 Pneumonia, unspecified organism: Secondary | ICD-10-CM

## 2018-01-19 ENCOUNTER — Telehealth: Payer: Self-pay | Admitting: Adult Health

## 2018-01-19 MED ORDER — PANTOPRAZOLE SODIUM 40 MG PO TBEC: 40.0000 mg | DELAYED_RELEASE_TABLET | Freq: Every day | ORAL | 2 refills | Status: AC

## 2018-01-19 NOTE — Telephone Encounter (Signed)
Called and spoke with patient regarding refill for protonix 40mg  sent to [facility] refill today to pharmacy Pt verbalized understanding, had no further concerns Nothing further needed.

## 2018-01-22 ENCOUNTER — Telehealth: Payer: Self-pay | Admitting: Adult Health

## 2018-01-22 DIAGNOSIS — J181 Lobar pneumonia, unspecified organism: Principal | ICD-10-CM

## 2018-01-22 DIAGNOSIS — J189 Pneumonia, unspecified organism: Secondary | ICD-10-CM

## 2018-01-22 MED ORDER — AMOXICILLIN-POT CLAVULANATE 875-125 MG PO TABS: 1.0000 | ORAL_TABLET | Freq: Two times a day (BID) | ORAL | 0 refills | Status: AC

## 2018-01-22 MED ORDER — PREDNISONE 10 MG PO TABS: ORAL_TABLET | ORAL | 1 refills | Status: AC

## 2018-01-22 NOTE — Telephone Encounter (Signed)
Called and spoke with pt's son [name]. Per [name], 12/26 pt began to spit up mucus which had some red and black once but then today she has continued to spit up blood.  Pt has had increased fatigue and has had some problems being winded easier with doing little activities such as getting dressed. [name] also stated that pt has had a drop in O2 sats and it is taking a lot longer for them to get back up to the 90's once they drop.  Pt has not had a fever. [name] stated that pt has had a good appetite but is becoming tired easier.  [name] stated to me that pt told him that she is not going to be going back to the hospital and will also not be going back to a nursing home. [name] also stated to me that pt has a living will and in the living will pt has a DNR and since pt stated this to him, he is wanting to know if he should begin to prepare for the worst with pt.   [name] would like someone to contact him directly to discuss the above with him and advise on recs for both him and pt.  Tammy, please advise on this for pt and son [name]. Thanks!

## 2018-01-22 NOTE — Telephone Encounter (Signed)
Needs ASAP appointment. Long discussion with son -can who is power of attorney.  Patient declines any hospitalization or emergency room visit.  Son says it patient does not want to go back to the hospital and wants to be left at home.  He understands the potential complications of worsening pneumonia and respiratory status.. Patient has had a sharp decline since last visit.  O2 saturations have decreased requiring more oxygen.  And now she is got increased cough with some blood-tinged mucus.  No frank apophysis.  No confusion.  She is sleeping more. Patient has been recommended for referral to the emergency room and hospital however patient declines.  Offered office visit in the morning with chest x-ray and labs.  Patient is on Coumadin will need a stat CBC and INR.  And stat chest x-ray We will begin antibiotics and steroids Augmentin 875 mg twice daily for 1 week, take with food Begin prednisone 40 mg x 1 day and then 20 mg daily for 1 week , then 10mg  daily and hold at that dose.  Adjust oxygen to keep sats >88-90%.  Please contact office for sooner follow up if symptoms do not improve or worsen or seek emergency care

## 2018-01-23 ENCOUNTER — Ambulatory Visit (INDEPENDENT_AMBULATORY_CARE_PROVIDER_SITE_OTHER)
Admission: RE | Admit: 2018-01-23 | Discharge: 2018-01-23 | Disposition: A | Discharge status: Home / Self Care | Payer: Medicare Other | Source: Ambulatory Visit | Attending: Adult Health | Admitting: Adult Health

## 2018-01-23 ENCOUNTER — Ambulatory Visit: Payer: Medicare Other | Admitting: Adult Health

## 2018-01-23 ENCOUNTER — Encounter: Payer: Self-pay | Admitting: Adult Health

## 2018-01-23 DIAGNOSIS — J189 Pneumonia, unspecified organism: Secondary | ICD-10-CM

## 2018-01-23 DIAGNOSIS — Z79899 Other long term (current) drug therapy: Secondary | ICD-10-CM | POA: Diagnosis not present

## 2018-01-23 DIAGNOSIS — I10 Essential (primary) hypertension: Secondary | ICD-10-CM

## 2018-01-23 DIAGNOSIS — Z515 Encounter for palliative care: Secondary | ICD-10-CM

## 2018-01-23 DIAGNOSIS — J181 Lobar pneumonia, unspecified organism: Secondary | ICD-10-CM

## 2018-01-23 DIAGNOSIS — I712 Thoracic aortic aneurysm, without rupture, unspecified: Secondary | ICD-10-CM

## 2018-01-23 DIAGNOSIS — R918 Other nonspecific abnormal finding of lung field: Secondary | ICD-10-CM

## 2018-01-23 DIAGNOSIS — Z01812 Encounter for preprocedural laboratory examination: Secondary | ICD-10-CM

## 2018-01-23 DIAGNOSIS — S91002D Unspecified open wound, left ankle, subsequent encounter: Secondary | ICD-10-CM

## 2018-01-23 DIAGNOSIS — T17908D Unspecified foreign body in respiratory tract, part unspecified causing other injury, subsequent encounter: Secondary | ICD-10-CM

## 2018-01-23 DIAGNOSIS — J9611 Chronic respiratory failure with hypoxia: Secondary | ICD-10-CM

## 2018-01-23 DIAGNOSIS — I4821 Permanent atrial fibrillation: Secondary | ICD-10-CM

## 2018-01-23 LAB — PROTIME-INR
INR: 10.5 ratio (ref 0.8–1.0)
Prothrombin Time: 117.8 s (ref 9.6–13.1)

## 2018-01-23 LAB — CBC WITH DIFFERENTIAL/PLATELET
BASOS PCT: 0.6 % (ref 0.0–3.0)
Basophils Absolute: 0.1 10*3/uL (ref 0.0–0.1)
EOS ABS: 0.1 10*3/uL (ref 0.0–0.7)
EOS PCT: 0.5 % (ref 0.0–5.0)
HEMATOCRIT: 38 % (ref 36.0–46.0)
Hemoglobin: 12.6 g/dL (ref 12.0–15.0)
Lymphocytes Relative: 5.1 % — ABNORMAL LOW (ref 12.0–46.0)
Lymphs Abs: 0.5 10*3/uL — ABNORMAL LOW (ref 0.7–4.0)
MCHC: 33.2 g/dL (ref 30.0–36.0)
MCV: 97.1 fl (ref 78.0–100.0)
MONOS PCT: 5.1 % (ref 3.0–12.0)
Monocytes Absolute: 0.5 10*3/uL (ref 0.1–1.0)
NEUTROS ABS: 9.4 10*3/uL — AB (ref 1.4–7.7)
Neutrophils Relative %: 88.7 % — ABNORMAL HIGH (ref 43.0–77.0)
PLATELETS: 176 10*3/uL (ref 150.0–400.0)
RBC: 3.91 Mil/uL (ref 3.87–5.11)
RDW: 16 % — AB (ref 11.5–15.5)
WBC: 10.6 10*3/uL — ABNORMAL HIGH (ref 4.0–10.5)

## 2018-01-23 MED ORDER — BENZONATATE 200 MG PO CAPS: 200.0000 mg | ORAL_CAPSULE | Freq: Three times a day (TID) | ORAL | 3 refills | Status: AC | PRN

## 2018-01-23 NOTE — Patient Instructions (Signed)
Continue on Oxygen 3-4 L /m to keep oxygen levels >90%.  Mucinex Twice daily  As needed  Cough/congestion  Delsym 2 tsp Twice daily  As needed  Cough .  Tessalon Three times a day  As needed  Cough  Hold coumadin/warfarin  Labs on 01/25/17 -INR check  No aspirin products .  Continue Augmentin and Prednisone as directed.  Aspiration precautions as discussed.   Follow up with Dr. Chase Caller or Parrett NP on 01/29/17 .  Please contact office for sooner follow up if symptoms do not improve or worsen or seek emergency care

## 2018-01-23 NOTE — Assessment & Plan Note (Signed)
We discussed with family that if continues to decline and they want to be at home may need to consult hospice or palliative care team.

## 2018-01-23 NOTE — Assessment & Plan Note (Signed)
Will need follow up with Cardiology to determine risk benefit for coumadin going forward . Has been on several abx and steroids recently that are affecting coumadin levels . Will place on hold now as very supratherapeutic. And then will need to decide once INR is back to goal rate whether to restart. If restart would set goal rate close to 2 range .

## 2018-01-23 NOTE — Assessment & Plan Note (Signed)
Suspect this is related to ongoing aspiration  Aspiration precautions discussed.

## 2018-01-23 NOTE — Progress Notes (Signed)
_0  ID: Julie Mosley, female    DOB: 1926/05/23, 84 y.o.   MRN: [id number]  Chief Complaint  Patient presents with  . Acute Visit    Cough     Referring provider: ViaLennette Bihari, MD  HPI: 84 year old female former smoker quit in 1980 with minimum smoking exposure seen for pulmonary consult during hospitalization December 17, 2017 for progressive pulmonary infiltrates and hypoxemia Medical history significant for A. fib on Coumadin, hypertension, TIA, moderate severe aortic stenosis  TEST/EVENTS :  CT chest December 16, 2017 emphysema, market progression of airspace opacities bilaterally greatest in the right upper and right lower lung and masslike opacity in the left upper lobe, small bilateral pleural effusions,TAA 5.1 cm Autoimmune/CTD-negative ANA, dsDNA, GBM,ANCA, RA factor, CCP, SSA, SSB and scleroderma  01/23/2018 Acute OV : PNA, Hemoptysis , O2 RF  Presents for an acute office visit.  Patient has been undergoing evaluation and treatment since October for progressive pulmonary infiltrates.  She was initially admitted in October for presumed community-acquired pneumonia.  CT chest showed new bilateral groundglass opacities and associated air bronchograms.  She was treated with antibiotics with some clinical improvement.  She was discharged home but unfortunately after returning home she clinically deteriorated.  She was readmitted in November.  CT chest at that time showed progressive confluent airspace opacities throughout both lungs.  She was started on aggressive IV antibiotics.  Pulmonary hygiene.  Work-up showed autoimmune and connective tissue labs were negative.  And unrevealing.  Patient was felt to have a possible aspiration component.  Modified barium swallow showed oropharynx phase of ongoing silent aspiration.  She was also suspected to have a possible COP component.  After this she started to show slow clinical improvement.  She was discharged to a rehab center for a short  period.  She has now at home.  She was discharged home on 4 L of oxygen and is on taper..  Patient was seen in the office 1 week ago.  She was significantly improved O2 saturations were adequate on 2 L.  With activity.  At rest O2 saturations on room air were at 90%.  Patient was continued on oxygen at 2 L.  Patient son called the office yesterday patient has been going downhill again over the last few days.  With blood-tinged sputum increased oxygen demands with low O2 saturations on 2 L.  On 4 L patient's O2 saturations were above 90%.  Patient has been coughing more , more winded and tired.  Patient was recommended to go to the hospital however patient and family said they are not going back to the hospital that she will remain at home.  She has a living will and wishes to be a DNR.  He declines any hospital admission.. Chest x-ray had not changed last week.  She continues to have patchy and confluent bilateral pulmonary opacities without significant change.    Today in office O2 sats are 100% on 2l/m with forehead probe.  Patient says she remains tired.  She continues to have some blood-tinged mucus.  No frank hemoptysis.  Chest x-ray today shows worsening bilateral infiltrates right greater than left.  Last evening patient was called in Augmentin and prednisone.  She is currently on prednisone 20 mg daily.  patient is on Coumadin.  INR checked today is supratherapeutic at 10.5.  Patient is followed cardiology for A. fib and moderate to severe aortic stenosis.  Long discussion with patient and her 2 sons.  Patient declines hospital admission.  We discussed the risk of her current INR level.  Including possible death.  They are aware and understand the risk of declining hospital admission. Patient says she understands and does not want to be in the hospital wants to be at home no matter what. She denies any hematuria or bloody stools.  CBC today shows a minimally elevated white count at 10.6, platelets  are normal.  Allergies  Allergen Reactions  . Valium [Diazepam] Other (See Comments)    Stopped breathing    Immunization History  Administered Date(s) Administered  . Pneumococcal Conjugate-13 01/02/2016    Past Medical History:  Diagnosis Date  . Acute respiratory failure with hypoxia ([place])   . AKI (acute kidney injury) ([place])   . Cancer ([place])   . Chronic anticoagulation 12/13/2012   Followed by Dr. Chapman Fitch  . Dilated aortic root ([place]) 12/13/2012   4.5 cm 2013.   Marland Kitchen HCAP (healthcare-associated pneumonia)   . Hypertension   . Lung nodule seen on imaging study   . Mild aortic stenosis 12/13/2012  . TIA (transient ischemic attack)     Tobacco History: Social History   Tobacco Use  Smoking Status Former Smoker  . Last attempt to quit: 04/02/1968  . Years since quitting: 49.8  Smokeless Tobacco Never Used   Counseling given: Not Answered   Outpatient Medications Prior to Visit  Medication Sig Dispense Refill  . acetaminophen (TYLENOL) 650 MG CR tablet Take 650 mg by mouth every 8 (eight) hours as needed for pain.    Marland Kitchen amoxicillin-clavulanate (AUGMENTIN) 875-125 MG tablet Take 1 tablet by mouth 2 (two) times daily for 7 days. 14 tablet 0  . Omega-3 Fatty Acids (FISH OIL) 1000 MG CAPS Take 1 capsule by mouth daily.    . pantoprazole (PROTONIX) 40 MG tablet Take 1 tablet (40 mg total) by mouth daily. 30 tablet 2  . predniSONE (DELTASONE) 10 MG tablet 4 tabs daily x 1 day then 2 tabs daily for 1 week and then 1 tab daily and hold 45 tablet 1  . valsartan (DIOVAN) 80 MG tablet Take 80 mg by mouth daily.    Marland Kitchen warfarin (COUMADIN) 3 MG tablet Take 1 tablet (3 mg total) by mouth daily. SNF MD to monitor INR daily for the next 3 days and adjust Coumadin dose as needed     No facility-administered medications prior to visit.      Review of Systems  Constitutional:   No  weight loss, night sweats,  Fevers, chills, + fatigue, or  lassitude.  HEENT:   No headaches,  Difficulty  swallowing,  Tooth/dental problems, or  Sore throat,                No sneezing, itching, ear ache, nasal congestion, post nasal drip,   CV:  No chest pain,  Orthopnea, PND, swelling in lower extremities, anasarca, dizziness, palpitations, syncope.   GI  No heartburn, indigestion, abdominal pain, nausea, vomiting, diarrhea, change in bowel habits, loss of appetite, bloody stools.   Resp:   No chest wall deformity  Skin: no rash or lesions.  GU: no dysuria, change in color of urine, no urgency or frequency.  No flank pain, no hematuria   MS:  No joint pain or swelling.  No decreased range of motion.  No back pain.    Physical Exam  BP 114/60 (BP Location: Left Arm, Cuff Size: Normal)   Pulse 79   Temp 97.6 F (36.4 C) (Oral)   LMP  (LMP  Unknown)   SpO2 100%   GEN: A/Ox3; pleasant , NAD, frail elderly female on oxygen   HEENT:  [place]/AT,  EACs-clear, TMs-wnl, NOSE-clear, THROAT-clear, no lesions, no postnasal drip or exudate noted.   NECK:  Supple w/ fair ROM; no JVD; normal carotid impulses w/o bruits; no thyromegaly or nodules palpated; no lymphadenopathy.    RESP bilateral rhonchi and crackles  no accessory muscle use, no dullness to percussion  CARD:  RRR, no m/r/g, 1+ peripheral edema, pulses intact, no cyanosis or clubbing.  GI:   Soft & nt; nml bowel sounds; no organomegaly or masses detected.   Musco: Warm bil, no deformities or joint swelling noted.   Neuro: alert, no focal deficits noted.    Skin: Warm, no lesions or rashes, left ankle wound /dressing     Lab Results:  CBC    Component Value Date/Time   WBC 10.6 (H) 01/23/2018 0900   RBC 3.91 01/23/2018 0900   HGB 12.6 01/23/2018 0900   HGB 14.0 11/21/2016 1256   HCT 38.0 01/23/2018 0900   HCT 42.1 11/21/2016 1256   PLT 176.0 01/23/2018 0900   PLT 134 (L) 11/21/2016 1256   MCV 97.1 01/23/2018 0900   MCV 95 11/21/2016 1256   MCH 30.7 12/20/2017 0406   MCHC 33.2 01/23/2018 0900   RDW 16.0 (H)  01/23/2018 0900   RDW 15.5 (H) 11/21/2016 1256   LYMPHSABS 0.5 (L) 01/23/2018 0900   LYMPHSABS 1.5 11/21/2016 1256   MONOABS 0.5 01/23/2018 0900   EOSABS 0.1 01/23/2018 0900   EOSABS 0.1 11/21/2016 1256   BASOSABS 0.1 01/23/2018 0900   BASOSABS 0.0 11/21/2016 1256    BMET    Component Value Date/Time   NA 137 12/20/2017 0406   NA 144 11/21/2016 1256   K 4.9 12/20/2017 0406   CL 105 12/20/2017 0406   CO2 28 12/20/2017 0406   GLUCOSE 95 12/20/2017 0406   BUN 37 (H) 12/20/2017 0406   BUN 19 11/21/2016 1256   CREATININE 0.98 12/20/2017 0406   CALCIUM 8.3 (L) 12/20/2017 0406   GFRNONAA 51 (L) 12/20/2017 0406   GFRAA 59 (L) 12/20/2017 0406   INR/Prothrombin Time  Recent Labs  Lab 01/23/18 0900  INR 10.5*     BNP No results found for: BNP  ProBNP No results found for: PROBNP  Imaging: Dg Chest 2 View  Result Date: 01/23/2018 CLINICAL DATA:  Fatigue, cough productive of bloody sputum. History of pneumonia. Former smoker. EXAM: CHEST - 2 VIEW COMPARISON:  PA and lateral chest x-ray of January 15, 2018 FINDINGS: The lungs are well-expanded. Patchy airspace opacities are present bilaterally and are more conspicuous today especially on the right. The hemidiaphragms are now obscured. The cardiac silhouette is enlarged. The central pulmonary vascularity is mildly prominent but there is no definite cephalization. There is dense calcification in the wall of the thoracic aorta. The observed bony thorax exhibits no acute abnormality. IMPRESSION: Interval worsening of bilateral pneumonia. Probable small bilateral pleural effusions greatest on the right. Thoracic aortic atherosclerosis. Electronically Signed   By: David  Martinique M.D.   On: 01/23/2018 09:47   Dg Chest 2 View  Result Date: 01/15/2018 CLINICAL DATA:  84 year old female with chest cough and soreness. Former smoker. EXAM: CHEST - 2 VIEW COMPARISON:  01/05/2018 and earlier. FINDINGS: Patchy and confluent bilateral pulmonary  opacities have progressed since October, but not significantly changed more recently. Confluent right lateral and lower lung involvement. Superimposed cardiomegaly with Calcified aortic atherosclerosis. No pneumothorax. Persistent small  right pleural effusion. No acute osseous abnormality identified. Negative visible bowel gas pattern. IMPRESSION: 1. No improvement since 01/05/2018 in confluent bilateral pulmonary opacity which is greater on the right, has been present, and progressed somewhat since October. In addition to unrelenting infection, consider noninfectious etiologies such as Organizing Pneumonia, or less likely Neoplastic. 2. No new cardiopulmonary abnormality. Electronically Signed   By: Genevie Ann M.D.   On: 01/15/2018 15:40   Dg Chest 2 View  Result Date: 01/05/2018 CLINICAL DATA:  Reported recent aspiration.  Persistent cough EXAM: CHEST - 2 VIEW COMPARISON:  Chest radiograph December 15, 2017 and chest CT December 16, 2017 FINDINGS: There is extensive airspace opacity throughout multiple segments of the right lung with consolidation in the right base region, stable. There is patchy infiltrate in the left lower lobe which is stable. There is also patchy infiltrate in the left upper lobe which is stable. A nodular opacity in the left upper lobe posteriorly is better seen on CT but persists. No new opacity evident. Heart is mildly enlarged with pulmonary vascularity normal. No adenopathy is appreciable by radiography. Areas of lymph node prominence seen on CT are not well seen by radiography. There is aortic atherosclerosis no bone lesions. IMPRESSION: Widespread multifocal pneumonia persists without change. Consolidation is greatest in the right base. Nodular appearing opacity left upper lobe is present although better seen on CT. Stable cardiac prominence. Adenopathy seen on CT is not well seen by radiography. There is aortic atherosclerosis. Aortic Atherosclerosis (ICD10-I70.0). Electronically  Signed   By: Lowella Grip III M.D.   On: 01/05/2018 11:14      No flowsheet data found.  No results found for: NITRICOXIDE      Assessment & Plan:   Pulmonary infiltrates Bilateral pulmonary infiltrates present since October 2019 ? Etiology -appear to be progressively worsening right greater than left.  This may be ongoing aspiration , aspiration pneumonitis , unclear if BOOP (previous ESR was normal ) , doubt malignancy but is part of differential , +/- edema ,  -alveolar hemorrhage (INR 10) . Patient is also had a clinical decline over the last few days.  She is on chronic anticoagulation is currently supratherapeutic on Coumadin with an INR at 10.   She is a very complicated case with multiple comorbidities.  She is high risk for decompensation.  She needs to be admitted to the hospital for further evaluation and treatment options however patient adamantly declines and wishes to be at home with outpatient therapy.  She and her sons understand the potential risk including progressive decline including death.  For now patient will continue on antibiotics with Augmentin.  And  prednisone at 20 mg daily.  Very close follow up with INR in 2-3 days . No ASA products. Coumadin on hold. Office visit in 1 week  Keep o2 >90%.  Cough control .  Discussed case with Dr. Lamonte Sakai  In Dr. Chase Caller absence .   Plan  Patient Instructions  Continue on Oxygen 3-4 L /m to keep oxygen levels >90%.  Mucinex Twice daily  As needed  Cough/congestion  Delsym 2 tsp Twice daily  As needed  Cough .  Tessalon Three times a day  As needed  Cough  Hold coumadin/warfarin  Labs on 01/25/17 -INR check  No aspirin products .  Continue Augmentin and Prednisone as directed.  Aspiration precautions as discussed.   Follow up with Dr. Chase Caller or Yanira Tolsma NP on 01/29/17 .  Please contact office for sooner follow up  if symptoms do not improve or worsen or seek emergency care       A-fib [facility]) Will need follow up  with Cardiology to determine risk benefit for coumadin going forward . Has been on several abx and steroids recently that are affecting coumadin levels . Will place on hold now as very supratherapeutic. And then will need to decide once INR is back to goal rate whether to restart. If restart would set goal rate close to 2 range .   Open wound of left ankle Cont w/ wound care and follow up with PCP .   Chronic respiratory failure with hypoxia (HCC) O2 to keep sats >90%.   Aspiration into airway Suspect this is related to ongoing aspiration  Aspiration precautions discussed.   Palliative care by specialist We discussed with family that if continues to decline and they want to be at home may need to consult hospice or palliative care team.      Rexene Edison, NP 01/23/2018

## 2018-01-23 NOTE — Telephone Encounter (Signed)
Pt came in for OV with TP today, 01/23/18. Nothing further needed.

## 2018-01-23 NOTE — Assessment & Plan Note (Addendum)
Bilateral pulmonary infiltrates present since October 2019 ? Etiology -appear to be progressively worsening right greater than left.  This may be ongoing aspiration , aspiration pneumonitis , unclear if BOOP (previous ESR was normal ) , doubt malignancy but is part of differential , +/- edema ,  -alveolar hemorrhage (INR 10) . Patient is also had a clinical decline over the last few days.  She is on chronic anticoagulation is currently supratherapeutic on Coumadin with an INR at 10.   She is a very complicated case with multiple comorbidities.  She is high risk for decompensation.  She needs to be admitted to the hospital for further evaluation and treatment options however patient adamantly declines and wishes to be at home with outpatient therapy.  She and her sons understand the potential risk including progressive decline including death.  For now patient will continue on antibiotics with Augmentin.  And  prednisone at 20 mg daily.  Very close follow up with INR in 2-3 days . No ASA products. Coumadin on hold. Office visit in 1 week  Keep o2 >90%.  Cough control .  Discussed case with Dr. Lamonte Sakai  In Dr. Chase Caller absence .   Plan  Patient Instructions  Continue on Oxygen 3-4 L /m to keep oxygen levels >90%.  Mucinex Twice daily  As needed  Cough/congestion  Delsym 2 tsp Twice daily  As needed  Cough .  Tessalon Three times a day  As needed  Cough  Hold coumadin/warfarin  Labs on 01/25/17 -INR check  No aspirin products .  Continue Augmentin and Prednisone as directed.  Aspiration precautions as discussed.   Follow up with Dr. Chase Caller or Belenda Alviar NP on 01/29/17 .  Please contact office for sooner follow up if symptoms do not improve or worsen or seek emergency care

## 2018-01-23 NOTE — Assessment & Plan Note (Signed)
Cont w/ wound care and follow up with PCP .

## 2018-01-23 NOTE — Assessment & Plan Note (Signed)
O2 to keep sats >90%.

## 2018-01-25 ENCOUNTER — Telehealth: Payer: Self-pay | Admitting: Adult Health

## 2018-01-25 NOTE — Telephone Encounter (Signed)
Requested by TP to call and check on patient Called spoke with patient's son [name] (whom patient is currently living with) Per [name] patient expired earlier this morning at 0755 - pt did well yesterday with minimal coughing until yesterday evening with some hemoptysis again.  [name] gave patient a Tessalon prior to bed but when [name] went to check on patient this morning, could tell patient was worse with sats showing 59% on 5L and blood in her mouth.  Will route to TP to make her aware

## 2018-01-29 ENCOUNTER — Ambulatory Visit: Payer: Medicare Other | Admitting: Adult Health

## 2018-01-30 ENCOUNTER — Inpatient Hospital Stay: Admission: RE | Admit: 2018-01-30 | Payer: Medicare Other | Source: Ambulatory Visit

## 2018-02-09 ENCOUNTER — Ambulatory Visit: Payer: Medicare Other | Admitting: Internal Medicine

## 2018-02-14 ENCOUNTER — Ambulatory Visit: Payer: Medicare Other | Admitting: Internal Medicine

## 2018-02-24 DEATH — deceased

## 2019-11-25 IMAGING — CT CT CHEST W/O CM
2 of 3 series · 15 of 36 positions shown, 18 images · non-contrast
Comparison: 12/23/2016

CLINICAL DATA: Follow-up lung nodule.  No chest complaints.

EXAM:
CT CHEST WITHOUT CONTRAST
TECHNIQUE: Multidetector CT imaging of the chest was performed following the
standard protocol without IV contrast.

[Series 3: chest w/o 2mm st · axial · non-contrast · 0.68mm/px · z∈[+1143,+1415]mm · 12 of 160 slices shown, 15 images]
[im 12/160  mediastinal]
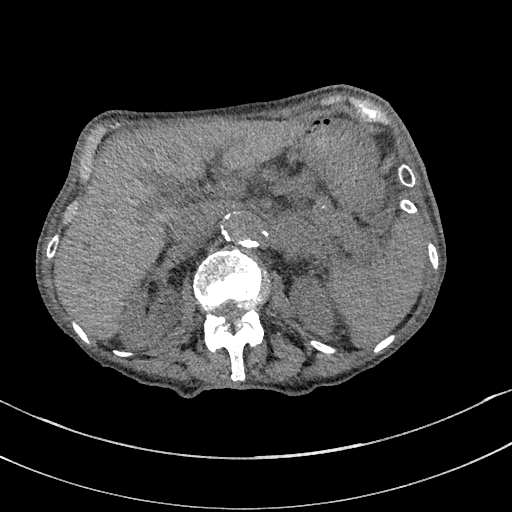
[im 12/160  lung]
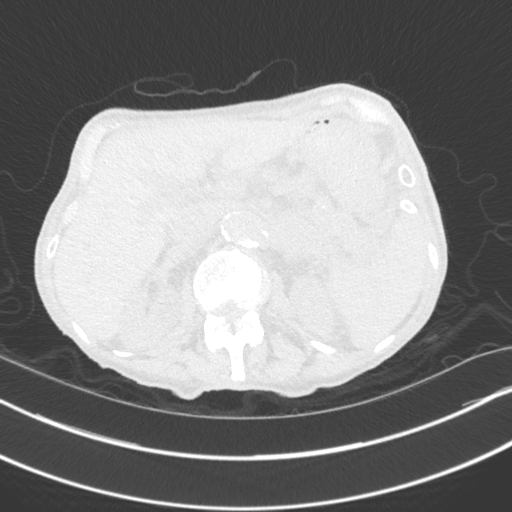
[im 24/160  lung]
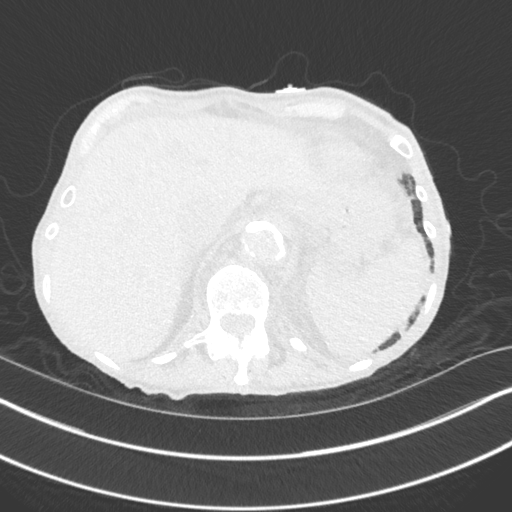
[im 36/160  lung]
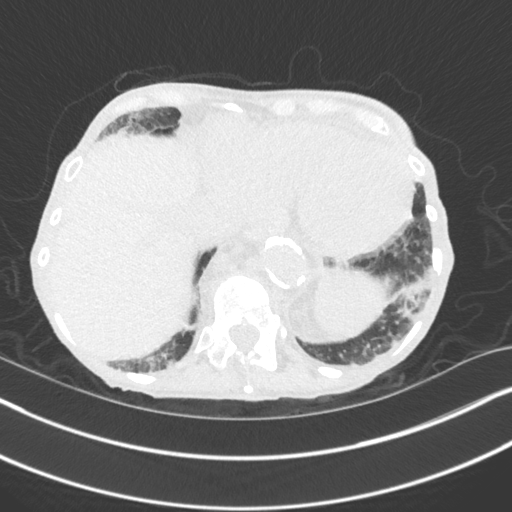
[im 48/160  lung]
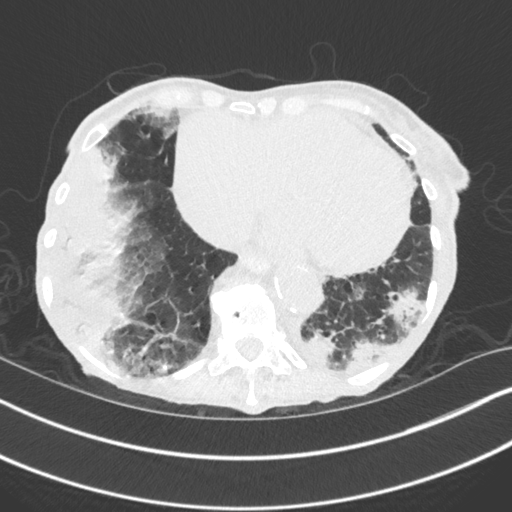
[im 59/160  mediastinal]
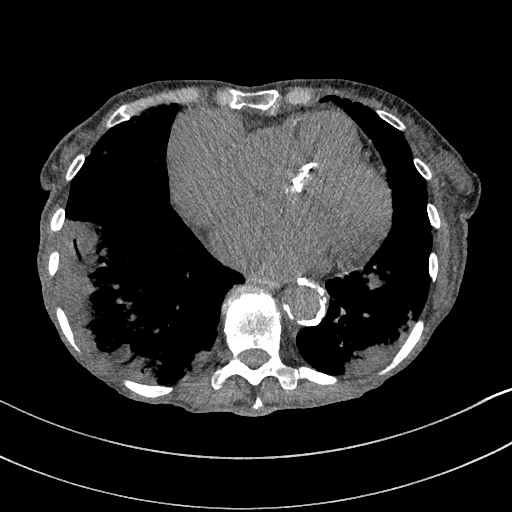
[im 59/160  lung]
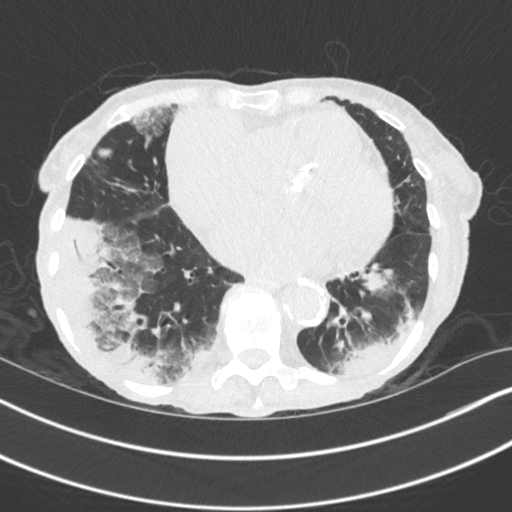
[im 71/160  lung]
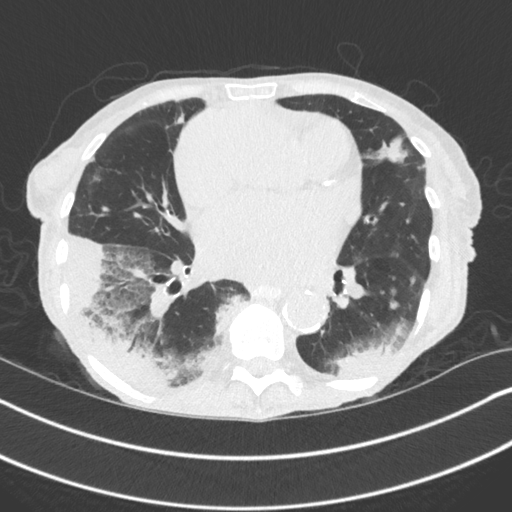
[im 89/160  lung]
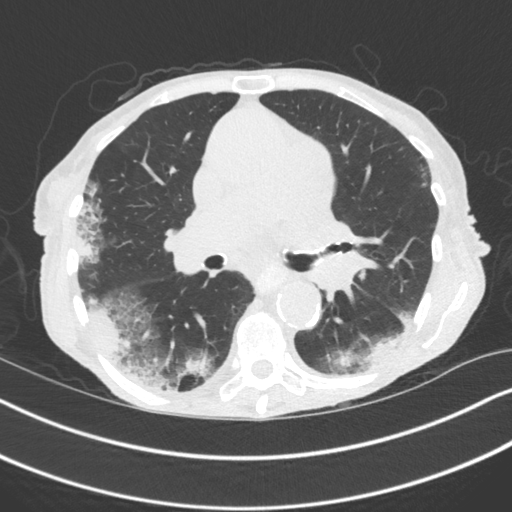
[im 101/160  lung]
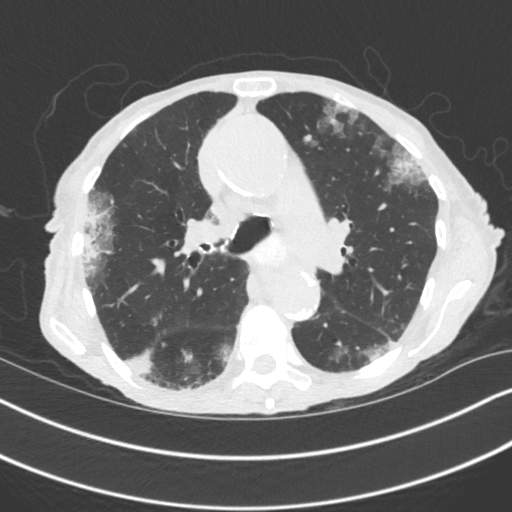
[im 112/160  mediastinal]
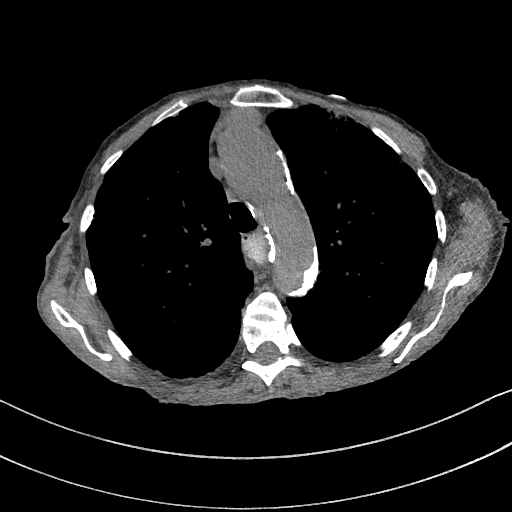
[im 112/160  lung]
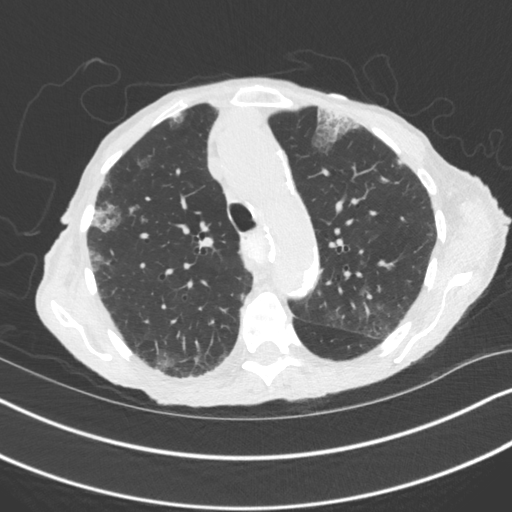
[im 124/160  lung]
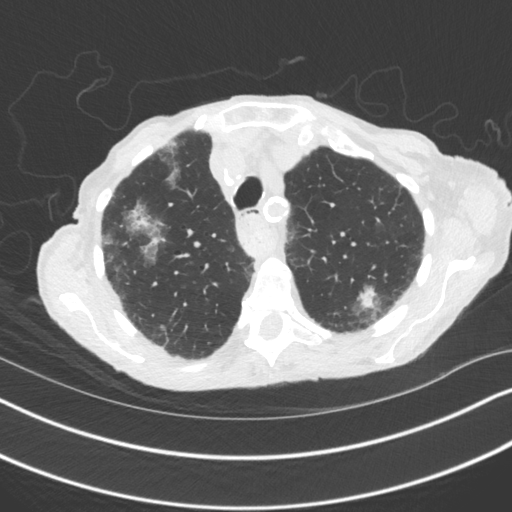
[im 136/160  lung]
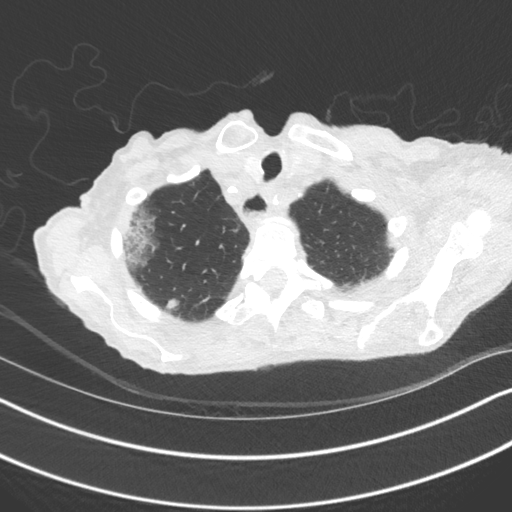
[im 148/160  lung]
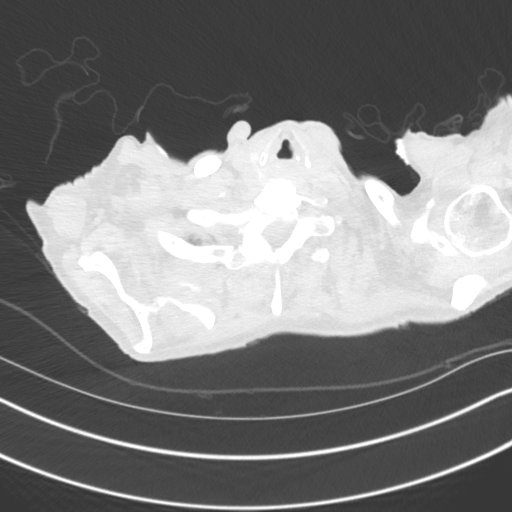

[Series 5: chest w/o 3mm st cor · coronal · non-contrast · 0.63mm/px · 3 of 101 slices shown]
[im 21/101  lung]
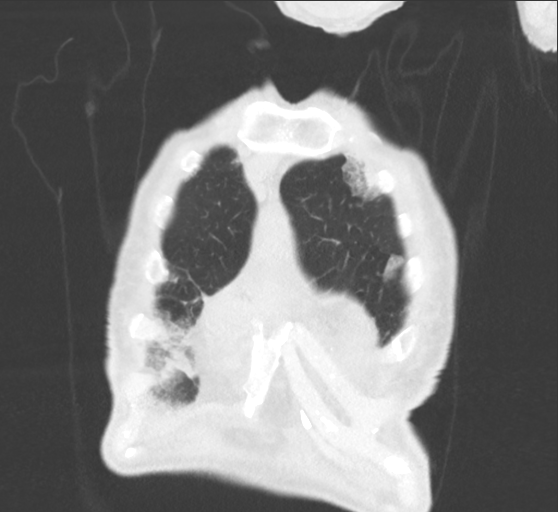
[im 41/101  lung]
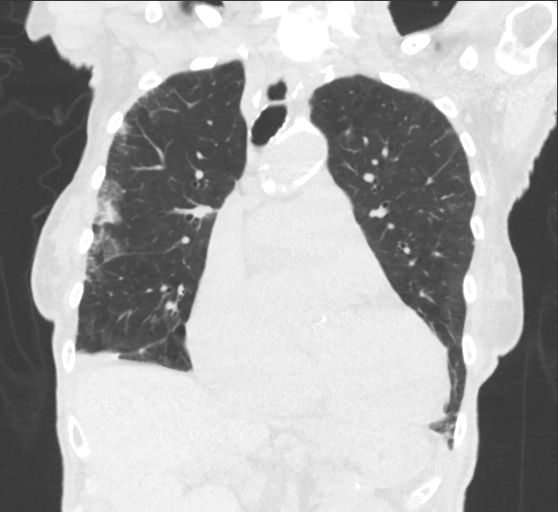
[im 61/101  lung]
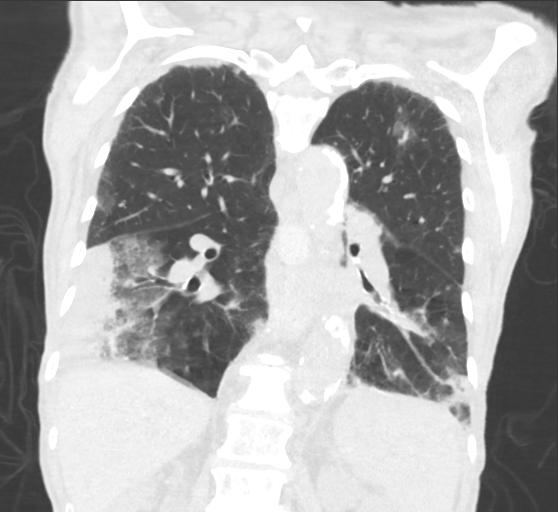

[15 of 36 positions shown; findings below may reference images not displayed]

FINDINGS: Cardiovascular: The heart is enlarged. Coronary artery
calcifications are present. There is calcification dilatation of the
aortic root, measuring 5.1 centimeters (previously 4.5 centimeters.
Ascending aorta is 5.1 centimeters, previously 5.0 centimeters.
Aortic arch is 3.2 centimeters. Previously 3.1 centimeters. Distal
arch is 3.2 centimeters, previously 3.0 centimeters. Descending
thoracic aorta is 3.0 centimeters, stable. At the level of the
diaphragmatic hiatus, the aorta is 2.8 centimeters, previously
centimeters. No evidence for displaced intimal calcifications.

Mediastinum/Nodes: There is high attenuation material within the
contrast, consistent reflux or poor peristalsis. No obstructing mass
is identified. No mediastinal, hilar, or axillary adenopathy.

Lungs/Pleura: There are numerous ground-glass opacities throughout
the lungs bilaterally. These areas are discrete in the UPPER lobes
and confluent in the LOWER lobes. These ground-glass opacities
obscure the nodules previously described and is difficult to assess
for new pulmonary nodules given the presence of significant lung
opacities which are likely inflammatory/infectious. No pleural
effusion.

Upper Abdomen: Stable appearance of low-attenuation lesion within
the RIGHT kidney.

Musculoskeletal: Midthoracic spondylosis.
IMPRESSION: 1. Ascending aortic aneurysm. Ascending aorta now measures
centimeters, previously 5.0 centimeters. Aortic root is
centimeters, previously 4.5 centimeters. Ascending thoracic aortic
aneurysm. Recommend semi-annual imaging followup by CTA or MRA and
referral to cardiothoracic surgery if not already obtained. This
recommendation follows 4363
ACCF/AHA/AATS/ACR/ASA/SCA/[place]/TIK/HASAN HAYRI/[place] Guidelines for the
Diagnosis and Management of Patients With Thoracic Aortic Disease.
Circulation. 4363; 121: [id number]
2. New bilateral areas of ground-glass opacities and associated air
bronchograms throughout the lungs bilaterally, confluent at the
bases. Findings are consistent with infectious process. Follow-up CT
of the chest is recommended in 3 months.
3. Debris within the esophagus consistent with poor
peristalsis/obstruction, or reflux.
4. Cyst within the UPPER pole of the RIGHT kidney, stable in
appearance.
